# Patient Record
Sex: Female | Born: 1945 | Race: White | Hispanic: No | Smoking: Never smoker
Health system: Southern US, Community
[De-identification: ages and names within clinical notes are randomized; demographics above are authoritative.]

## PROBLEM LIST (undated history)

## (undated) DIAGNOSIS — E079 Disorder of thyroid, unspecified: Secondary | ICD-10-CM

## (undated) DIAGNOSIS — M810 Age-related osteoporosis without current pathological fracture: Secondary | ICD-10-CM

## (undated) DIAGNOSIS — G4730 Sleep apnea, unspecified: Secondary | ICD-10-CM

## (undated) DIAGNOSIS — H269 Unspecified cataract: Secondary | ICD-10-CM

## (undated) DIAGNOSIS — Z86718 Personal history of other venous thrombosis and embolism: Secondary | ICD-10-CM

## (undated) HISTORY — DX: Unspecified cataract: H26.9

## (undated) HISTORY — DX: Sleep apnea, unspecified: G47.30

## (undated) HISTORY — DX: Personal history of other venous thrombosis and embolism: Z86.718

## (undated) HISTORY — PX: OTHER SURGICAL HISTORY: SHX169

## (undated) HISTORY — PX: FEMUR FRACTURE SURGERY: SHX633

## (undated) HISTORY — DX: Disorder of thyroid, unspecified: E07.9

## (undated) HISTORY — DX: Age-related osteoporosis without current pathological fracture: M81.0

## (undated) HISTORY — PX: BUNIONECTOMY: SHX129

## (undated) HISTORY — PX: CHOLECYSTECTOMY: SHX55

## (undated) HISTORY — PX: REPLACEMENT TOTAL KNEE BILATERAL: SUR1225

---

## 2018-02-09 LAB — COMPREHENSIVE METABOLIC PANEL
Albumin: 3.5 (ref 3.5–5.0)
Calcium: 9.5 (ref 8.7–10.7)
GFR calc Af Amer: 57.16
GFR calc non Af Amer: 47.16
Globulin: 2.9

## 2018-02-09 LAB — BASIC METABOLIC PANEL
BUN: 16 (ref 4–21)
CO2: 29 — AB (ref 13–22)
Chloride: 106 (ref 99–108)
Creatinine: 1.1 (ref <=1.1)
Glucose: 87
Potassium: 4 (ref 3.4–5.3)
Sodium: 145 (ref 137–147)

## 2018-02-09 LAB — HEPATIC FUNCTION PANEL
ALT: 18 (ref 7–35)
AST: 18 (ref 13–35)
Bilirubin, Total: 1

## 2018-02-09 LAB — CBC AND DIFFERENTIAL
HCT: 42 (ref 36–46)
Hemoglobin: 13.4 (ref 12.0–16.0)
Platelets: 214 (ref 150–399)
WBC: 7.4

## 2018-02-09 LAB — CBC: RBC: 4.39 (ref 3.87–5.11)

## 2018-02-09 LAB — TSH: TSH: 1.74 (ref 0.41–5.90)

## 2018-09-07 LAB — BASIC METABOLIC PANEL
BUN: 18 (ref 4–21)
CO2: 30 — AB (ref 13–22)
Chloride: 106 (ref 99–108)
Creatinine: 1.2 — AB (ref 0.5–1.1)
Glucose: 94
Potassium: 3.9 (ref 3.4–5.3)
Sodium: 144 (ref 137–147)

## 2018-09-07 LAB — COMPREHENSIVE METABOLIC PANEL
Calcium: 10.2 (ref 8.7–10.7)
GFR calc Af Amer: 56.49
GFR calc non Af Amer: 46.61

## 2018-09-30 LAB — BASIC METABOLIC PANEL
BUN: 17 (ref 4–21)
CO2: 28 — AB (ref 13–22)
Chloride: 104 (ref 99–108)
Creatinine: 1.1 (ref <=1.1)
Potassium: 3.8 (ref 3.4–5.3)
Sodium: 142 (ref 137–147)

## 2018-09-30 LAB — CBC AND DIFFERENTIAL
HCT: 41 (ref 36–46)
Hemoglobin: 13.1 (ref 12.0–16.0)
Platelets: 250 (ref 150–399)
WBC: 9.6

## 2018-09-30 LAB — HEPATIC FUNCTION PANEL
ALT: 9 (ref 7–35)
AST: 12 — AB (ref 13–35)
Bilirubin, Total: 0.9

## 2018-09-30 LAB — TSH: TSH: 14.08 — AB (ref 0.41–5.90)

## 2018-09-30 LAB — COMPREHENSIVE METABOLIC PANEL
Albumin: 3.9 (ref 3.5–5.0)
Calcium: 9.3 (ref 8.7–10.7)
GFR calc Af Amer: 58.23
GFR calc non Af Amer: 48.05
Globulin: 2.4

## 2018-09-30 LAB — CBC: RBC: 4.29 (ref 3.87–5.11)

## 2018-10-26 LAB — HM DEXA SCAN: HM Dexa Scan: -3

## 2018-10-27 LAB — HM MAMMOGRAPHY

## 2018-11-25 DIAGNOSIS — M25561 Pain in right knee: Secondary | ICD-10-CM | POA: Insufficient documentation

## 2018-11-25 LAB — TSH: TSH: 3.7 (ref 0.41–5.90)

## 2019-03-01 LAB — BASIC METABOLIC PANEL
BUN: 24 — AB (ref 4–21)
CO2: 31 — AB (ref 13–22)
Chloride: 105 (ref 99–108)
Creatinine: 1.1 (ref 0.5–1.1)
Glucose: 94
Potassium: 4.1 (ref 3.4–5.3)
Sodium: 144 (ref 137–147)

## 2019-03-01 LAB — COMPREHENSIVE METABOLIC PANEL
Calcium: 9.4 (ref 8.7–10.7)
GFR calc Af Amer: 59.38
GFR calc non Af Amer: 49

## 2019-06-11 LAB — BASIC METABOLIC PANEL
BUN: 20 (ref 4–21)
CO2: 26 — AB (ref 13–22)
Chloride: 107 (ref 99–108)
Creatinine: 1.1 (ref 0.5–1.1)
Glucose: 92
Potassium: 3.4 (ref 3.4–5.3)
Sodium: 143 (ref 137–147)

## 2019-06-11 LAB — CBC AND DIFFERENTIAL
HCT: 41 (ref 36–46)
Hemoglobin: 13.6 (ref 12.0–16.0)
Neutrophils Absolute: 76.9
Platelets: 223 (ref 150–399)
WBC: 8

## 2019-06-11 LAB — CBC: RBC: 4.6 (ref 3.87–5.11)

## 2019-06-11 LAB — COMPREHENSIVE METABOLIC PANEL: Calcium: 9.3 (ref 8.7–10.7)

## 2019-06-29 LAB — LIPID PANEL
Cholesterol: 141 (ref 0–200)
HDL: 45 (ref 35–70)
LDL Cholesterol: 76
LDl/HDL Ratio: 3
Triglycerides: 102 (ref 40–160)

## 2019-06-29 LAB — TSH: TSH: 2.23 (ref <=5.90)

## 2019-10-26 LAB — BASIC METABOLIC PANEL
BUN: 21 (ref 4–21)
CO2: 26 — AB (ref 13–22)
Chloride: 110 — AB (ref 99–108)
Creatinine: 1 (ref <=1.1)
Glucose: 97
Potassium: 3.9 (ref 3.4–5.3)
Sodium: 15 — AB (ref 137–147)

## 2019-10-26 LAB — COMPREHENSIVE METABOLIC PANEL
Calcium: 9.6 (ref 8.7–10.7)
GFR calc Af Amer: 63.24
GFR calc non Af Amer: 52.18

## 2019-11-08 LAB — NOVEL CORONAVIRUS, NAA: SARS-CoV-2, NAA: NEGATIVE

## 2019-12-01 LAB — NOVEL CORONAVIRUS, NAA: SARS-CoV-2, NAA: NEGATIVE

## 2019-12-04 LAB — NOVEL CORONAVIRUS, NAA: SARS-CoV-2, NAA: NOT DETECTED

## 2019-12-28 LAB — HM MAMMOGRAPHY

## 2019-12-30 LAB — CBC AND DIFFERENTIAL
HCT: 42 (ref 36–46)
Hemoglobin: 13.5 (ref 12.0–16.0)
Platelets: 255 (ref 150–399)
WBC: 8.4

## 2019-12-30 LAB — HEPATIC FUNCTION PANEL
ALT: 17 (ref 7–35)
AST: 17 (ref 13–35)
Bilirubin, Total: 0.8

## 2019-12-30 LAB — COMPREHENSIVE METABOLIC PANEL
Albumin: 3.8 (ref 3.5–5.0)
Calcium: 9.1 (ref 8.7–10.7)
GFR calc Af Amer: 55.17
GFR calc non Af Amer: 45.52
Globulin: 2.5

## 2019-12-30 LAB — BASIC METABOLIC PANEL
BUN: 17 (ref 4–21)
CO2: 28 — AB (ref 13–22)
Chloride: 108 (ref 99–108)
Creatinine: 1.2 — AB (ref <=1.1)
Glucose: 101
Potassium: 4.1 (ref 3.4–5.3)
Sodium: 143 (ref 137–147)

## 2019-12-30 LAB — TSH: TSH: 7.18 — AB (ref <=5.90)

## 2019-12-30 LAB — LIPID PANEL
Cholesterol: 181 (ref 0–200)
HDL: 52 (ref 35–70)
LDL Cholesterol: 108
Triglycerides: 103 (ref 40–160)

## 2019-12-30 LAB — VITAMIN D 25 HYDROXY (VIT D DEFICIENCY, FRACTURES): Vit D, 25-Hydroxy: 10.6

## 2019-12-30 LAB — CBC: RBC: 4.54 (ref 3.87–5.11)

## 2020-04-05 LAB — BASIC METABOLIC PANEL
BUN: 22 — AB (ref 4–21)
CO2: 32 — AB (ref 13–22)
Chloride: 106 (ref 99–108)
Creatinine: 1.2 — AB (ref <=1.1)
Glucose: 101
Potassium: 3.4 (ref 3.4–5.3)
Sodium: 143 (ref 137–147)

## 2020-04-05 LAB — CBC AND DIFFERENTIAL
HCT: 42 (ref 36–46)
Hemoglobin: 13.3 (ref 12.0–16.0)
Platelets: 247 (ref 150–399)
WBC: 8.1

## 2020-04-05 LAB — COMPREHENSIVE METABOLIC PANEL
Albumin: 3.8 (ref 3.5–5.0)
Calcium: 9.5 (ref 8.7–10.7)
GFR calc Af Amer: 52.04
GFR calc non Af Amer: 42.94
Globulin: 2.7

## 2020-04-05 LAB — HEPATIC FUNCTION PANEL
ALT: 11 (ref 7–35)
AST: 14 (ref 13–35)

## 2020-04-05 LAB — CBC: RBC: 4.48 (ref 3.87–5.11)

## 2020-04-05 LAB — TSH: TSH: 0.69 (ref <=5.90)

## 2020-06-08 LAB — COMPREHENSIVE METABOLIC PANEL
Calcium: 9.3 (ref 8.7–10.7)
GFR calc Af Amer: 56.78
GFR calc non Af Amer: 46.85

## 2020-06-08 LAB — BASIC METABOLIC PANEL
BUN: 20 (ref 4–21)
CO2: 30 — AB (ref 13–22)
Chloride: 110 — AB (ref 99–108)
Creatinine: 1.1 (ref <=1.1)
Glucose: 102
Potassium: 3.5 (ref 3.4–5.3)
Sodium: 147 (ref 137–147)

## 2020-07-17 ENCOUNTER — Ambulatory Visit (INDEPENDENT_AMBULATORY_CARE_PROVIDER_SITE_OTHER): Payer: Medicare PPO | Admitting: Family Medicine

## 2020-07-17 ENCOUNTER — Other Ambulatory Visit: Payer: Self-pay

## 2020-07-17 ENCOUNTER — Encounter: Payer: Self-pay | Admitting: Family Medicine

## 2020-07-17 VITALS — BP 130/80 | HR 75 | Temp 98.0°F | Resp 18 | Ht 60.0 in | Wt 206.4 lb

## 2020-07-17 DIAGNOSIS — M1990 Unspecified osteoarthritis, unspecified site: Secondary | ICD-10-CM | POA: Insufficient documentation

## 2020-07-17 DIAGNOSIS — M545 Low back pain, unspecified: Secondary | ICD-10-CM

## 2020-07-17 DIAGNOSIS — E785 Hyperlipidemia, unspecified: Secondary | ICD-10-CM | POA: Diagnosis not present

## 2020-07-17 DIAGNOSIS — Z9989 Dependence on other enabling machines and devices: Secondary | ICD-10-CM

## 2020-07-17 DIAGNOSIS — I10 Essential (primary) hypertension: Secondary | ICD-10-CM

## 2020-07-17 DIAGNOSIS — F325 Major depressive disorder, single episode, in full remission: Secondary | ICD-10-CM

## 2020-07-17 DIAGNOSIS — G8929 Other chronic pain: Secondary | ICD-10-CM

## 2020-07-17 DIAGNOSIS — M810 Age-related osteoporosis without current pathological fracture: Secondary | ICD-10-CM

## 2020-07-17 DIAGNOSIS — E039 Hypothyroidism, unspecified: Secondary | ICD-10-CM

## 2020-07-17 DIAGNOSIS — G4733 Obstructive sleep apnea (adult) (pediatric): Secondary | ICD-10-CM

## 2020-07-17 NOTE — Progress Notes (Signed)
Amanda Mathews is a 74 y.o. female who presents today for an office visit.  She is a new patient.   Assessment/Plan:  Chronic Problems Addressed Today: Chronic low back pain No red flags.  She has seen pain management but needs a new physician to help manage her low back pain here in the area.  Her sister sees Dr. Danielle Dess and she would like to see him as well.  She will continue gabapentin, etodolac, Robaxin.  She has spinal stimulator in place.  Essential hypertension At goal.  Continue HCTZ 25 mg daily.  Hypothyroidism Had TSH checked 2 weeks ago which was reportedly normal.  We will continue Synthroid 150 mcg daily.  Dyslipidemia Stable.  Continue simvastatin 20 mg daily.  Had lipid panel done couple of weeks ago.  Osteoporosis Continue calcium and vitamin D supplementation.  She will continue Prolia injections every 6 months.  Last had injection about 2 weeks ago.  Depression, major, single episode, complete remission (HCC) Stable.  Continue Wellbutrin 300 mg daily.  Osteoarthritis Continue management per orthopedics.  OSA on CPAP Continue CPAP.  She will follow up in 6 months.     Subjective:  HPI:  Her stable, chronic medical conditions are outlined below:  # Essential Hypertension -On HCTZ 25 mg daily tolerating well.  # Hypothyroidism -On Synthroid 150 mcg daily and tolerating well  # Dyslipidemia - On simvastatin 20 mg daily and tolerating well  # Osteoporosis - Has prolia injections q31months - On calcium and vitamin D supplementation  # Depression - On wellbutrin 300mg  daily and tolerating well.   # Osteoarthritis s/p bilateral TKA  # Chronic Low Back Pain - Sees Pain Management specialist - Has had ESI in the past which help modestly.  - Has nerve stimulator in place - On gabapentin 600mg  daily - On Etodolac 400mg  twice daily - On Robaxin 750mg  twice daily as needed - On potassium and magnesium to help with cramping  # OSA on CPAP # Reactive  Airway - On albuterol as needed   PMH:  The following were reviewed and entered/updated in epic: Past Medical History:  Diagnosis Date  . Cataract   . Osteoporosis   . Sleep apnea   . Thyroid disease    Patient Active Problem List   Diagnosis Date Noted  . Chronic low back pain 07/17/2020  . Essential hypertension 07/17/2020  . Hypothyroidism 07/17/2020  . Dyslipidemia 07/17/2020  . Osteoporosis 07/17/2020  . 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 Osteoarthritis 07/17/2020  . OSA on CPAP 07/17/2020   Past Surgical History:  Procedure Laterality Date  . BUNIONECTOMY    . CHOLECYSTECTOMY    . FEMUR FRACTURE SURGERY    . REPLACEMENT TOTAL KNEE BILATERAL      Family History  Problem Relation Age of Onset  . Hypertension Mother   . Stroke Mother   . Arthritis Sister   . COPD Sister   . Depression Sister   . Diabetes Sister   . Alcohol abuse Brother   . Hypertension Sister     Medications- reviewed and updated Current Outpatient Medications  Medication Sig Dispense Refill  . albuterol (VENTOLIN HFA) 108 (90 Base) MCG/ACT inhaler Inhale 2 puffs into the lungs every 6 (six) hours as needed for wheezing or shortness of breath.    09/16/2020 albuterol (VENTOLIN HFA) 108 (90 Base) MCG/ACT inhaler Inhale into the lungs every 6 (six) hours as needed.    09/16/2020 ammonium lactate (AMLACTIN) 12 %  cream Apply topically in the morning and at bedtime.    Marland Kitchen buPROPion (WELLBUTRIN XL) 300 MG 24 hr tablet Take 300 mg by mouth daily.    . Calcium Carb-Cholecalciferol (CALCIUM 600-D PO) Take 600 mg by mouth daily.    . Cholecalciferol (VITAMIN D3) 1.25 MG (50000 UT) CAPS Take 50,000 Units by mouth once a week.    . denosumab (PROLIA) 60 MG/ML SOSY injection Inject 60 mg into the skin every 6 (six) months.    . etodolac (LODINE) 400 MG tablet Take 400 mg by mouth 2 (two) times daily.    Marland Kitchen gabapentin (NEURONTIN) 300 MG capsule Take 600 mg by mouth daily.    .  hydrochlorothiazide (HYDRODIURIL) 25 MG tablet Take 25 mg by mouth daily.    Marland Kitchen levothyroxine (SYNTHROID) 150 MCG tablet Take 150 mcg by mouth daily before breakfast.    . magnesium gluconate (MAGONATE) 500 MG tablet Take 500 mg by mouth daily.    . methocarbamol (ROBAXIN) 750 MG tablet Take 750 mg by mouth 2 (two) times daily as needed for muscle spasms.    . NON FORMULARY Bi-pap machine    . potassium chloride SA (KLOR-CON) 20 MEQ tablet Take 20 mEq by mouth daily.    . simvastatin (ZOCOR) 20 MG tablet Take 20 mg by mouth at bedtime.     No current facility-administered medications for this visit.    Allergies-reviewed and updated Allergies  Allergen Reactions  . Codeine Nausea And Vomiting    Social History   Socioeconomic History  . Marital status: Married    Spouse name: Not on file  . Number of children: Not on file  . Years of education: Not on file  . Highest education level: Not on file  Occupational History  . Not on file  Tobacco Use  . Smoking status: Never Smoker  . Smokeless tobacco: Never Used  Substance and Sexual Activity  . Alcohol use: Never  . Drug use: Never  . Sexual activity: Not Currently    Birth control/protection: Abstinence  Other Topics Concern  . Not on file  Social History Narrative  . Not on file   Social Determinants of Health   Financial Resource Strain:   . Difficulty of Paying Living Expenses:   Food Insecurity:   . Worried About Programme researcher, broadcasting/film/video in the Last Year:   . Barista in the Last Year:   Transportation Needs:   . Freight forwarder (Medical):   Marland Kitchen Lack of Transportation (Non-Medical):   Physical Activity:   . Days of Exercise per Week:   . Minutes of Exercise per Session:   Stress:   . Feeling of Stress :   Social Connections:   . Frequency of Communication with Friends and Family:   . Frequency of Social Gatherings with Friends and Family:   . Attends Religious Services:   . Active Member of Clubs or  Organizations:   . Attends Banker Meetings:   Marland Kitchen Marital Status:         Objective:  Physical Exam: BP 130/80   Pulse 75   Temp 98 F (36.7 C) (Temporal)   Resp 18   Ht 5' (1.524 m)   Wt 206 lb 6.4 oz (93.6 kg)   SpO2 95%   BMI 40.31 kg/m   Gen: No acute distress, resting comfortably CV: Regular rate and rhythm with no murmurs appreciated Pulm: Normal work of breathing, clear to auscultation bilaterally with no  crackles, wheezes, or rhonchi Neuro: Grossly normal, moves all extremities Psych: Normal affect and thought content  Time Spent: 64 minutes of total time was spent on the date of the encounter performing the following actions: chart review prior to seeing the patient, obtaining history including pertinent PMH and past surgical history, performing a medically necessary exam, counseling on the treatment plan, placing orders, and documenting in our EHR.        Katina Degree. [name], MD 07/17/2020 2:24 PM

## 2020-07-17 NOTE — Assessment & Plan Note (Signed)
Had TSH checked 2 weeks ago which was reportedly normal.  We will continue Synthroid 150 mcg daily.

## 2020-07-17 NOTE — Assessment & Plan Note (Signed)
No red flags.  She has seen pain management but needs a new physician to help manage her low back pain here in the area.  Her sister sees Dr. Danielle Dess and she would like to see him as well.  She will continue gabapentin, etodolac, Robaxin.  She has spinal stimulator in place.

## 2020-07-17 NOTE — Assessment & Plan Note (Signed)
Continue CPAP.  

## 2020-07-17 NOTE — Assessment & Plan Note (Signed)
Continue calcium and vitamin D supplementation.  She will continue Prolia injections every 6 months.  Last had injection about 2 weeks ago.

## 2020-07-17 NOTE — Assessment & Plan Note (Signed)
At goal.  Continue HCTZ 25 mg daily 

## 2020-07-17 NOTE — Assessment & Plan Note (Signed)
Stable.  Continue simvastatin 20 mg daily.  Had lipid panel done couple of weeks ago.

## 2020-07-17 NOTE — Assessment & Plan Note (Signed)
Stable.  Continue Wellbutrin 300 mg daily. 

## 2020-07-17 NOTE — Patient Instructions (Signed)
It was very nice to see you today!  I will place a referral for you to see Dr Danielle Dess.  We will get your medical records.  Come back to see me in 6 months or sooner if needed.   Take care, Dr [name]  Please try these tips to maintain a healthy lifestyle:   Eat at least 3 REAL meals and 1-2 snacks per day.  Aim for no more than 5 hours between eating.  If you eat breakfast, please do so within one hour of getting up.    Each meal should contain half fruits/vegetables, one quarter protein, and one quarter carbs (no bigger than a computer mouse)   Cut down on sweet beverages. This includes juice, soda, and sweet tea.     Drink at least 1 glass of water with each meal and aim for at least 8 glasses per day   Exercise at least 150 minutes every week.

## 2020-07-17 NOTE — Assessment & Plan Note (Signed)
Continue management per orthopedics. 

## 2020-07-26 ENCOUNTER — Other Ambulatory Visit: Payer: Self-pay

## 2020-07-26 ENCOUNTER — Telehealth: Payer: Self-pay | Admitting: Family Medicine

## 2020-07-26 MED ORDER — METHOCARBAMOL 750 MG PO TABS: 750.0000 mg | ORAL_TABLET | Freq: Two times a day (BID) | ORAL | 1 refills | Status: DC | PRN

## 2020-07-26 NOTE — Telephone Encounter (Signed)
Rx sent 

## 2020-07-26 NOTE — Telephone Encounter (Signed)
..   LAST APPOINTMENT DATE: 07/17/2020   NEXT APPOINTMENT DATE:@2 /06/2021  MEDICATION:methocarbamol (ROBAXIN) 750 MG tablet   PHARMACY:[id number] - , [place] - [address] AT [facility]  **Let patient know to contact pharmacy at the end of the day to make sure medication is ready. **  ** Please notify patient to allow 48-72 hours to process**  **Encourage patient to contact the pharmacy for refills or they can request refills through [facility]**  CLINICAL FILLS OUT ALL BELOW:   LAST REFILL:  QTY:  REFILL DATE:    OTHER COMMENTS:    Okay for refill?  Please advise

## 2020-09-13 ENCOUNTER — Encounter: Payer: Self-pay | Admitting: Family Medicine

## 2020-09-14 ENCOUNTER — Encounter: Payer: Self-pay | Admitting: Family Medicine

## 2020-09-25 ENCOUNTER — Other Ambulatory Visit: Payer: Self-pay | Admitting: Family Medicine

## 2020-10-06 ENCOUNTER — Other Ambulatory Visit: Payer: Self-pay

## 2020-10-06 ENCOUNTER — Ambulatory Visit: Payer: Medicare PPO

## 2020-10-06 ENCOUNTER — Ambulatory Visit (INDEPENDENT_AMBULATORY_CARE_PROVIDER_SITE_OTHER): Payer: Medicare PPO

## 2020-10-06 DIAGNOSIS — M48062 Spinal stenosis, lumbar region with neurogenic claudication: Secondary | ICD-10-CM | POA: Diagnosis not present

## 2020-10-06 DIAGNOSIS — G959 Disease of spinal cord, unspecified: Secondary | ICD-10-CM | POA: Diagnosis not present

## 2020-10-06 DIAGNOSIS — Z6839 Body mass index (BMI) 39.0-39.9, adult: Secondary | ICD-10-CM | POA: Diagnosis not present

## 2020-10-06 DIAGNOSIS — Z0000 Encounter for general adult medical examination without abnormal findings: Secondary | ICD-10-CM | POA: Diagnosis not present

## 2020-10-06 DIAGNOSIS — I10 Essential (primary) hypertension: Secondary | ICD-10-CM | POA: Diagnosis not present

## 2020-10-06 NOTE — Progress Notes (Signed)
Virtual Visit via Telephone Note  I connected with  Amanda Mathews on 10/06/20 at  8:00 AM EDT by telephone and verified that I am speaking with the correct person using two identifiers.  Medicare Annual Wellness visit completed telephonically due to Covid-19 pandemic.   Persons participating in this call: This Health Coach and this patient.   Location: Patient: Home Provider: Office   I discussed the limitations, risks, security and privacy concerns of performing an evaluation and management service by telephone and the availability of in person appointments. The patient expressed understanding and agreed to proceed.  Unable to perform video visit due to video visit attempted and failed and/or patient does not have video capability.   Some vital signs may be absent or patient reported.   Amanda Schleinina H Fordyce Lepak, LPN    Subjective:   Amanda Mathews is a 74 y.o. female who presents for an Initial Medicare Annual Wellness Visit.  Review of Systems     Cardiac Risk Factors include: advanced age (>755men, 33>65 women);hypertension;dyslipidemia;obesity (BMI >30kg/m2)     Objective:    There were no vitals filed for this visit. There is no height or weight on file to calculate BMI.  Advanced Directives 10/06/2020  Does Patient Have a Medical Advance Directive? Yes  Type of Estate agentAdvance Directive Healthcare Power of DexterAttorney;Living will  Copy of Healthcare Power of Attorney in Chart? No - copy requested    Current Medications (verified) Outpatient Encounter Medications as of 10/06/2020  Medication Sig  . albuterol (VENTOLIN HFA) 108 (90 Base) MCG/ACT inhaler Inhale 2 puffs into the lungs every 6 (six) hours as needed for wheezing or shortness of breath.  Marland Kitchen. ammonium lactate (AMLACTIN) 12 % cream Apply topically in the morning and at bedtime.  Marland Kitchen. buPROPion (WELLBUTRIN XL) 300 MG 24 hr tablet Take 300 mg by mouth daily.  . Calcium Carb-Cholecalciferol (CALCIUM 600-D PO) Take 600 mg by  mouth daily.  . Cholecalciferol (VITAMIN D3) 1.25 MG (50000 UT) CAPS Take 50,000 Units by mouth once a week.  . denosumab (PROLIA) 60 MG/ML SOSY injection Inject 60 mg into the skin every 6 (six) months.  . etodolac (LODINE) 400 MG tablet Take 400 mg by mouth 2 (two) times daily.  Marland Kitchen. gabapentin (NEURONTIN) 300 MG capsule Take 600 mg by mouth daily.  . hydrochlorothiazide (HYDRODIURIL) 25 MG tablet Take 25 mg by mouth daily.  Marland Kitchen. HYDROcodone-acetaminophen (NORCO) 7.5-325 MG tablet hydrocodone 7.5 mg-acetaminophen 325 mg tablet  . levothyroxine (SYNTHROID) 150 MCG tablet Take 150 mcg by mouth daily before breakfast.  . magnesium gluconate (MAGONATE) 500 MG tablet Take 500 mg by mouth daily.  . methocarbamol (ROBAXIN) 750 MG tablet TAKE 1 TABLET (750 MG TOTAL) BY MOUTH 2 (TWO) TIMES DAILY AS NEEDED FOR MUSCLE SPASMS  . NON FORMULARY Bi-pap machine  . potassium chloride SA (KLOR-CON) 20 MEQ tablet Take 20 mEq by mouth daily.  . simvastatin (ZOCOR) 20 MG tablet Take 20 mg by mouth at bedtime.  . sulfamethoxazole-trimethoprim (BACTRIM DS) 800-160 MG tablet sulfamethoxazole 800 mg-trimethoprim 160 mg tablet  . tamsulosin (FLOMAX) 0.4 MG CAPS capsule   . [DISCONTINUED] albuterol (VENTOLIN HFA) 108 (90 Base) MCG/ACT inhaler Inhale into the lungs every 6 (six) hours as needed.   No facility-administered encounter medications on file as of 10/06/2020.    Allergies (verified) Codeine and Ropinirole   History: Past Medical History:  Diagnosis Date  . Cataract   . Osteoporosis   . Sleep apnea   . Thyroid disease  Past Surgical History:  Procedure Laterality Date  . BUNIONECTOMY    . CHOLECYSTECTOMY    . FEMUR FRACTURE SURGERY    . REPLACEMENT TOTAL KNEE BILATERAL     Family History  Problem Relation Age of Onset  . Hypertension Mother   . Stroke Mother   . Arthritis Sister   . COPD Sister   . Depression Sister   . Diabetes Sister   . Alcohol abuse Brother   . Hypertension Sister     Social History   Socioeconomic History  . Marital status: Married    Spouse name: Not on file  . Number of children: Not on file  . Years of education: Not on file  . Highest education level: Not on file  Occupational History  . Occupation: Retired   Tobacco Use  . Smoking status: Never Smoker  . Smokeless tobacco: Never Used  Substance and Sexual Activity  . Alcohol use: Never  . Drug use: Never  . Sexual activity: Not Currently    Birth control/protection: Abstinence  Other Topics Concern  . Not on file  Social History Narrative  . Not on file   Social Determinants of Health   Financial Resource Strain: Low Risk   . Difficulty of Paying Living Expenses: Not hard at all  Food Insecurity: No Food Insecurity  . Worried About Programme researcher, broadcasting/film/video in the Last Year: Never true  . Ran Out of Food in the Last Year: Never true  Transportation Needs: No Transportation Needs  . Lack of Transportation (Medical): No  . Lack of Transportation (Non-Medical): No  Physical Activity: Sufficiently Active  . Days of Exercise per Week: 5 days  . Minutes of Exercise per Session: 30 min  Stress: No Stress Concern Present  . Feeling of Stress : Not at all  Social Connections: Moderately Isolated  . Frequency of Communication with Friends and Family: More than three times a week  . Frequency of Social Gatherings with Friends and Family: More than three times a week  . Attends Religious Services: More than 4 times per year  . Active Member of Clubs or Organizations: No  . Attends Banker Meetings: Never  . Marital Status: Divorced    Tobacco Counseling Counseling given: Not Answered   Clinical Intake:  Pre-visit preparation completed: Yes  Pain : No/denies pain     BMI - recorded: 40.31 Nutritional Status: BMI > 30  Obese Nutritional Risks: None Diabetes: No  How often do you need to have someone help you when you read instructions, pamphlets, or other written  materials from your doctor or pharmacy?: 1 - Never  Diabetic?No  Interpreter Needed?: No  Information entered by :: Lanier Ensign, LPN   Activities of Daily Living In your present state of health, do you have any difficulty performing the following activities: 10/06/2020 07/17/2020  Hearing? N N  Vision? N N  Difficulty concentrating or making decisions? Y N  Comment memeory at times -  Walking or climbing stairs? Y Y  Comment can be difficulty at times -  Dressing or bathing? N N  Doing errands, shopping? N N  Preparing Food and eating ? N -  Using the Toilet? N -  In the past six months, have you accidently leaked urine? N -  Do you have problems with loss of bowel control? N -  Managing your Medications? N -  Managing your Finances? N -  Housekeeping or managing your Housekeeping? N -  Some  recent data might be hidden    Patient Care Team: Ardith Dark, MD as PCP - General (Family Medicine)  Indicate any recent Medical Services you may have received from other than [facility] providers in the past year (date may be approximate).     Assessment:   This is a routine wellness examination for Azaryah.  Hearing/Vision screen  Hearing Screening   125Hz  250Hz  500Hz  1000Hz  2000Hz  3000Hz  4000Hz  6000Hz  8000Hz   Right ear:           Left ear:           Comments: Pt denies any difficulty hearing   Vision Screening Comments: Pt follows up with Dr annually   Dietary issues and exercise activities discussed: Current Exercise Habits: Home exercise routine, Type of exercise: walking (driveway and grocery stores), Time (Minutes): 30, Frequency (Times/Week): 5, Weekly Exercise (Minutes/Week): 150  Goals    . Patient Stated     Lose weight and increase walking      Depression Screen PHQ 2/9 Scores 10/06/2020 07/17/2020  PHQ - 2 Score 0 0    Fall Risk Fall Risk  10/06/2020  Falls in the past year? 0  Number falls in past yr: 0  Injury with Fall? 0  Risk for fall due to :  Impaired vision;Impaired mobility;Impaired balance/gait  Follow up Falls prevention discussed    Any stairs in or around the home? Yes  If so, are there any without handrails? No  Home free of loose throw rugs in walkways, pet beds, electrical cords, etc? Yes  Adequate lighting in your home to reduce risk of falls? Yes   ASSISTIVE DEVICES UTILIZED TO PREVENT FALLS:  Life alert? No  Use of a cane, walker or w/c? Yes  Grab bars in the bathroom? Yes  Shower chair or bench in shower? Yes  Elevated toilet seat or a handicapped toilet? No   TIMED UP AND GO:  Was the test performed? No .     Cognitive Function:     6CIT Screen 10/06/2020  What Year? 0 points  What month? 0 points  Count back from 20 0 points  Months in reverse 0 points  Repeat phrase 0 points    Immunizations Immunization History  Administered Date(s) Administered  . Influenza, High Dose Seasonal PF 10/26/2019  . Influenza,inj,quad, With Preservative 05/16/2017, 09/15/2018, 08/17/2019  . Influenza-Unspecified 10/15/2012, 09/06/2013, 09/12/2014, 09/26/2015, 10/03/2016, 09/24/2017, 09/25/2018  . Moderna SARS-COVID-2 Vaccination 01/25/2020, 02/22/2020  . PPD Test 09/12/2014  . Pneumococcal-Unspecified 07/07/2013, 11/21/2014  . Tdap 09/12/2014  . Unspecified SARS-COV-2 Vaccination 01/25/2020  . Zoster 12/14/2013, 09/16/2017, 04/01/2018    TDAP status: Up to date Flu Vaccine status: Declined, Education has been provided regarding the importance of this vaccine but patient still declined. Advised may receive this vaccine at local pharmacy or Health Dept. Aware to provide a copy of the vaccination record if obtained from local pharmacy or Health Dept. Verbalized acceptance and understanding. Pneumococcal vaccine status: Up to date Unspecified 07/07/13 & 11/21/14  Covid-19 vaccine status: Completed vaccines  Qualifies for Shingles Vaccine? Yes   Zostavax completed Yes   Shingrix Completed?: No.    Education has  been provided regarding the importance of this vaccine. Patient has been advised to call insurance company to determine out of pocket expense if they have not yet received this vaccine. Advised may also receive vaccine at local pharmacy or Health Dept. Verbalized acceptance and understanding. Pt stated that she had shingrix in 07/09/2013 [place]  Screening Tests Health Maintenance  Topic Date Due  . Hepatitis C Screening  Never done  . PNA vac Low Risk Adult (2 of 2 - PCV13) 11/22/2015  . INFLUENZA VACCINE  03/15/2021 (Originally 07/16/2020)  . MAMMOGRAM  12/27/2021  . TETANUS/TDAP  09/12/2024  . COLONOSCOPY  12/16/2029  . DEXA SCAN  Completed  . COVID-19 Vaccine  Completed    Health Maintenance  Health Maintenance Due  Topic Date Due  . Hepatitis C Screening  Never done  . PNA vac Low Risk Adult (2 of 2 - PCV13) 11/22/2015    Colorectal cancer screening: Completed 12/17/19. Repeat every 10 years Mammogram status: Completed 12/28/19. Repeat every year Bone Density status: Completed 10/26/18. Results reflect: Bone density results: OSTEOPOROSIS. Repeat every 2 years.    Additional Screening:  Hepatitis C Screening: does qualify  Vision Screening: Recommended annual ophthalmology exams for early detection of glaucoma and other disorders of the eye. Is the patient up to date with their annual eye exam?  Yes  Who is the provider or what is the name of the office in which the patient attends annual eye exams? Dr Jovita Kussmaul   Dental Screening: Recommended annual dental exams for proper oral hygiene  Community Resource Referral / Chronic Care Management: CRR required this visit?  No   CCM required this visit?  No      Plan:     I have personally reviewed and noted the following in the patient's chart:   . Medical and social history . Use of alcohol, tobacco or illicit drugs  . Current medications and supplements . Functional ability and status . Nutritional status . Physical  activity . Advanced directives . List of other physicians . Hospitalizations, surgeries, and ER visits in previous 12 months . Vitals . Screenings to include cognitive, depression, and falls . Referrals and appointments  In addition, I have reviewed and discussed with patient certain preventive protocols, quality metrics, and best practice recommendations. A written personalized care plan for preventive services as well as general preventive health recommendations were provided to patient.     Amanda Schlein, LPN   [id number]   Nurse Notes: None

## 2020-10-06 NOTE — Patient Instructions (Addendum)
Amanda Mathews , Thank you for taking time to come for your Medicare Wellness Visit. I appreciate your ongoing commitment to your health goals. Please review the following plan we discussed and let me know if I can assist you in the future.   Screening recommendations/referrals: Colonoscopy: Done 12/17/19 Mammogram: Done 12/28/19 Bone Density: Done 10/26/18 Recommended yearly ophthalmology/optometry visit for glaucoma screening and checkup Recommended yearly dental visit for hygiene and checkup  Vaccinations: Influenza vaccine: Postponed until 03/15/21 Pneumococcal vaccine: Unspecified 07/07/13, 11/21/14 Tdap vaccine: Up to date Shingles vaccine: Zoster 12/14/13, 09/16/17, & 04/01/18   Covid-19:Completed 01/25/20 & 02/22/20  Advanced directives: Please bring a copy of your health care power of attorney and living will to the office at your convenience.  Conditions/risks identified: Lose weight and increase walking  Next appointment: Follow up in one year for your annual wellness visit    Preventive Care 65 Years and Older, Female Preventive care refers to lifestyle choices and visits with your health care provider that can promote health and wellness. What does preventive care include?  A yearly physical exam. This is also called an annual well check.  Dental exams once or twice a year.  Routine eye exams. Ask your health care provider how often you should have your eyes checked.  Personal lifestyle choices, including:  Daily care of your teeth and gums.  Regular physical activity.  Eating a healthy diet.  Avoiding tobacco and drug use.  Limiting alcohol use.  Practicing safe sex.  Taking low-dose aspirin every day.  Taking vitamin and mineral supplements as recommended by your health care provider. What happens during an annual well check? The services and screenings done by your health care provider during your annual well check will depend on your age, overall health,  lifestyle risk factors, and family history of disease. Counseling  Your health care provider may ask you questions about your:  Alcohol use.  Tobacco use.  Drug use.  Emotional well-being.  Home and relationship well-being.  Sexual activity.  Eating habits.  History of falls.  Memory and ability to understand (cognition).  Work and work Astronomer.  Reproductive health. Screening  You may have the following tests or measurements:  Height, weight, and BMI.  Blood pressure.  Lipid and cholesterol levels. These may be checked every 5 years, or more frequently if you are over 40 years old.  Skin check.  Lung cancer screening. You may have this screening every year starting at age 7 if you have a 30-pack-year history of smoking and currently smoke or have quit within the past 15 years.  Fecal occult blood test (FOBT) of the stool. You may have this test every year starting at age 26.  Flexible sigmoidoscopy or colonoscopy. You may have a sigmoidoscopy every 5 years or a colonoscopy every 10 years starting at age 66.  Hepatitis C blood test.  Hepatitis B blood test.  Sexually transmitted disease (STD) testing.  Diabetes screening. This is done by checking your blood sugar (glucose) after you have not eaten for a while (fasting). You may have this done every 1-3 years.  Bone density scan. This is done to screen for osteoporosis. You may have this done starting at age 13.  Mammogram. This may be done every 1-2 years. Talk to your health care provider about how often you should have regular mammograms. Talk with your health care provider about your test results, treatment options, and if necessary, the need for more tests. Vaccines  Your health care  provider may recommend certain vaccines, such as:  Influenza vaccine. This is recommended every year.  Tetanus, diphtheria, and acellular pertussis (Tdap, Td) vaccine. You may need a Td booster every 10 years.  Zoster  vaccine. You may need this after age 67.  Pneumococcal 13-valent conjugate (PCV13) vaccine. One dose is recommended after age 17.  Pneumococcal polysaccharide (PPSV23) vaccine. One dose is recommended after age 67. Talk to your health care provider about which screenings and vaccines you need and how often you need them. This information is not intended to replace advice given to you by your health care provider. Make sure you discuss any questions you have with your health care provider. Document Released: 12/29/2015 Document Revised: 08/21/2016 Document Reviewed: 10/03/2015 Elsevier Interactive Patient Education  2017 [place] Prevention in the Home Falls can cause injuries. They can happen to people of all ages. There are many things you can do to make your home safe and to help prevent falls. What can I do on the outside of my home?  Regularly fix the edges of walkways and driveways and fix any cracks.  Remove anything that might make you trip as you walk through a door, such as a raised step or threshold.  Trim any bushes or trees on the path to your home.  Use bright outdoor lighting.  Clear any walking paths of anything that might make someone trip, such as rocks or tools.  Regularly check to see if handrails are loose or broken. Make sure that both sides of any steps have handrails.  Any raised decks and porches should have guardrails on the edges.  Have any leaves, snow, or ice cleared regularly.  Use sand or salt on walking paths during winter.  Clean up any spills in your garage right away. This includes oil or grease spills. What can I do in the bathroom?  Use night lights.  Install grab bars by the toilet and in the tub and shower. Do not use towel bars as grab bars.  Use non-skid mats or decals in the tub or shower.  If you need to sit down in the shower, use a plastic, non-slip stool.  Keep the floor dry. Clean up any water that spills on the  floor as soon as it happens.  Remove soap buildup in the tub or shower regularly.  Attach bath mats securely with double-sided non-slip rug tape.  Do not have throw rugs and other things on the floor that can make you trip. What can I do in the bedroom?  Use night lights.  Make sure that you have a light by your bed that is easy to reach.  Do not use any sheets or blankets that are too big for your bed. They should not hang down onto the floor.  Have a firm chair that has side arms. You can use this for support while you get dressed.  Do not have throw rugs and other things on the floor that can make you trip. What can I do in the kitchen?  Clean up any spills right away.  Avoid walking on wet floors.  Keep items that you use a lot in easy-to-reach places.  If you need to reach something above you, use a strong step stool that has a grab bar.  Keep electrical cords out of the way.  Do not use floor polish or wax that makes floors slippery. If you must use wax, use non-skid floor wax.  Do not have throw  rugs and other things on the floor that can make you trip. What can I do with my stairs?  Do not leave any items on the stairs.  Make sure that there are handrails on both sides of the stairs and use them. Fix handrails that are broken or loose. Make sure that handrails are as [place] as the stairways.  Check any carpeting to make sure that it is firmly attached to the stairs. Fix any carpet that is loose or worn.  Avoid having throw rugs at the top or bottom of the stairs. If you do have throw rugs, attach them to the floor with carpet tape.  Make sure that you have a light switch at the top of the stairs and the bottom of the stairs. If you do not have them, ask someone to add them for you. What else can I do to help prevent falls?  Wear shoes that:  Do not have high heels.  Have rubber bottoms.  Are comfortable and fit you well.  Are closed at the toe. Do not wear  sandals.  If you use a stepladder:  Make sure that it is fully opened. Do not climb a closed stepladder.  Make sure that both sides of the stepladder are locked into place.  Ask someone to hold it for you, if possible.  Clearly mark and make sure that you can see:  Any grab bars or handrails.  First and last steps.  Where the edge of each step is.  Use tools that help you move around (mobility aids) if they are needed. These include:  Canes.  Walkers.  Scooters.  Crutches.  Turn on the lights when you go into a dark area. Replace any light bulbs as soon as they burn out.  Set up your furniture so you have a clear path. Avoid moving your furniture around.  If any of your floors are uneven, fix them.  If there are any pets around you, be aware of where they are.  Review your medicines with your doctor. Some medicines can make you feel dizzy. This can increase your chance of falling. Ask your doctor what other things that you can do to help prevent falls. This information is not intended to replace advice given to you by your health care provider. Make sure you discuss any questions you have with your health care provider. Document Released: 09/28/2009 Document Revised: 05/09/2016 Document Reviewed: 01/06/2015 Elsevier Interactive Patient Education  2017 [employer].

## 2020-10-09 ENCOUNTER — Other Ambulatory Visit (HOSPITAL_COMMUNITY): Payer: Self-pay | Admitting: Neurological Surgery

## 2020-10-09 ENCOUNTER — Other Ambulatory Visit: Payer: Self-pay | Admitting: Neurological Surgery

## 2020-10-09 DIAGNOSIS — G959 Disease of spinal cord, unspecified: Secondary | ICD-10-CM

## 2020-10-09 DIAGNOSIS — M48062 Spinal stenosis, lumbar region with neurogenic claudication: Secondary | ICD-10-CM

## 2020-10-19 ENCOUNTER — Ambulatory Visit (HOSPITAL_COMMUNITY)
Admission: RE | Admit: 2020-10-19 | Discharge: 2020-10-19 | Disposition: A | Discharge status: Home / Self Care | Payer: Medicare PPO | Source: Ambulatory Visit | Attending: Neurological Surgery | Admitting: Neurological Surgery

## 2020-10-19 ENCOUNTER — Other Ambulatory Visit: Payer: Self-pay

## 2020-10-19 ENCOUNTER — Ambulatory Visit (HOSPITAL_COMMUNITY): Payer: Medicare PPO

## 2020-10-19 ENCOUNTER — Encounter (HOSPITAL_COMMUNITY): Payer: Self-pay

## 2020-10-19 DIAGNOSIS — M4716 Other spondylosis with myelopathy, lumbar region: Secondary | ICD-10-CM | POA: Diagnosis not present

## 2020-10-19 DIAGNOSIS — M48062 Spinal stenosis, lumbar region with neurogenic claudication: Secondary | ICD-10-CM | POA: Diagnosis not present

## 2020-10-19 DIAGNOSIS — M4714 Other spondylosis with myelopathy, thoracic region: Secondary | ICD-10-CM | POA: Diagnosis not present

## 2020-10-19 DIAGNOSIS — M4802 Spinal stenosis, cervical region: Secondary | ICD-10-CM | POA: Diagnosis not present

## 2020-10-19 DIAGNOSIS — M48061 Spinal stenosis, lumbar region without neurogenic claudication: Secondary | ICD-10-CM | POA: Diagnosis not present

## 2020-10-19 DIAGNOSIS — M47814 Spondylosis without myelopathy or radiculopathy, thoracic region: Secondary | ICD-10-CM | POA: Diagnosis not present

## 2020-10-19 DIAGNOSIS — M4712 Other spondylosis with myelopathy, cervical region: Secondary | ICD-10-CM | POA: Diagnosis not present

## 2020-10-19 DIAGNOSIS — G959 Disease of spinal cord, unspecified: Secondary | ICD-10-CM | POA: Diagnosis not present

## 2020-10-19 IMAGING — CT CT L SPINE W/ CM
3 series · 9 of 33 positions shown, 10 images · IV contrast (Omni 300)
Comparison: none

CLINICAL DATA: Severe mid back pain
TECHNIQUE: Contiguous axial images were obtained through the Cervical,
Thoracic, and Lumbar spine after the intrathecal infusion of
infusion. Coronal and sagittal reconstructions were obtained of the
axial image sets.

[Series 6: l-spine 2.0 st · axial · 0.28mm/px · z∈[+834,+834]mm · 1 of 103 slices shown, 2 images]
[im 55/103  soft-tissue]
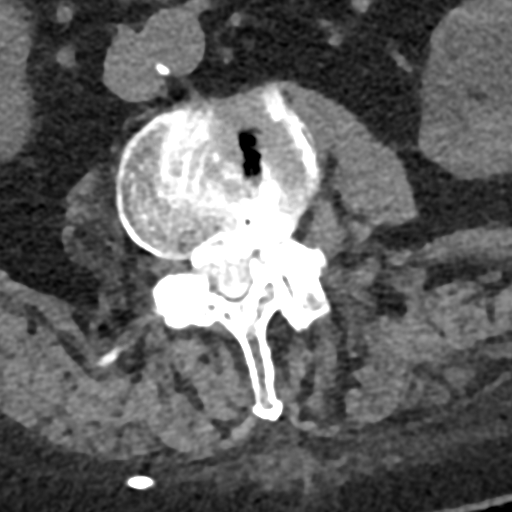
[im 55/103  bone]
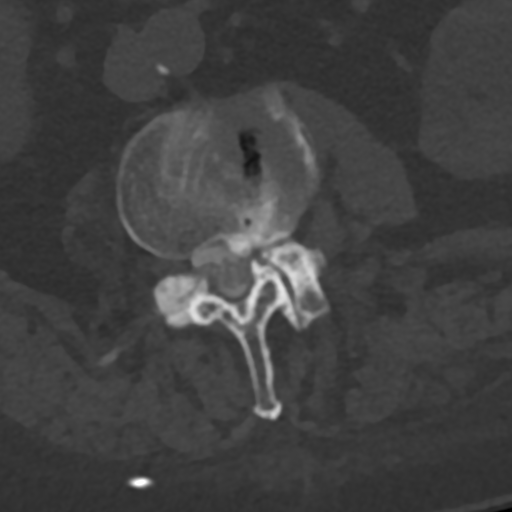

[Series 10: l-spine 2.0 cor bone · coronal · 0.26mm/px · 3 of 53 slices shown]
[im 11/53  bone]
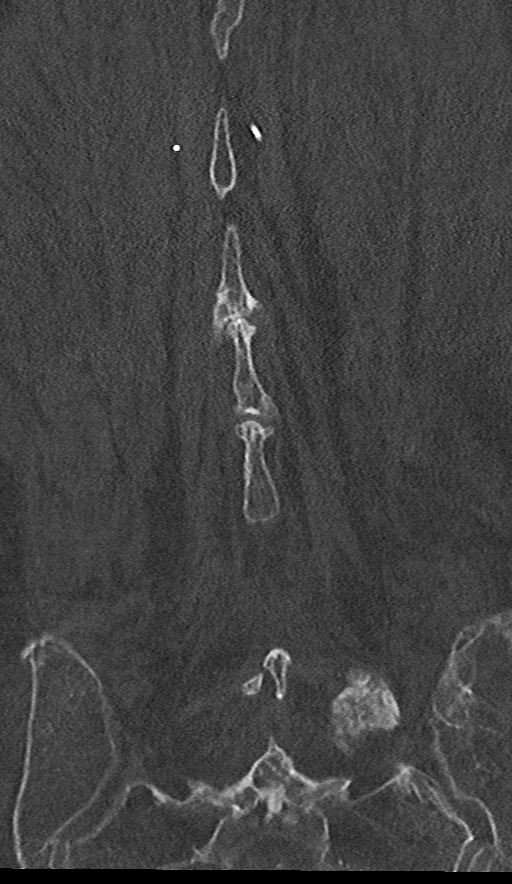
[im 21/53  bone]
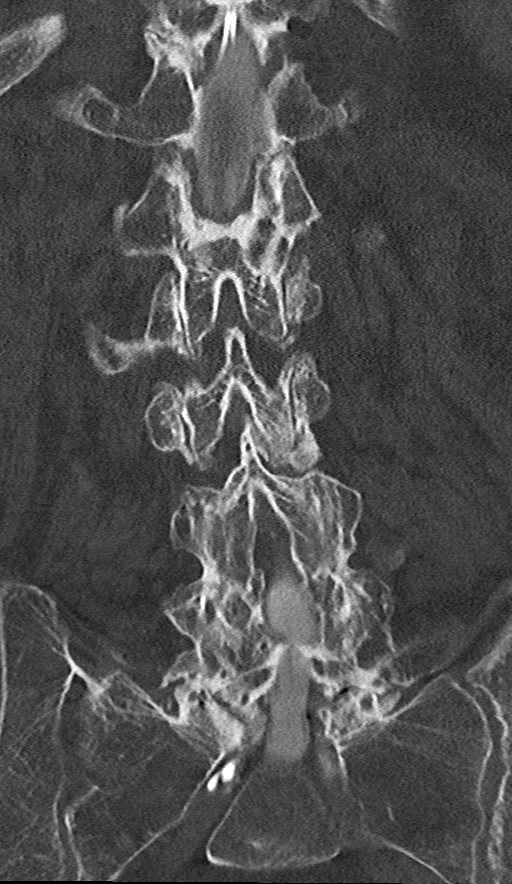
[im 32/53  bone]
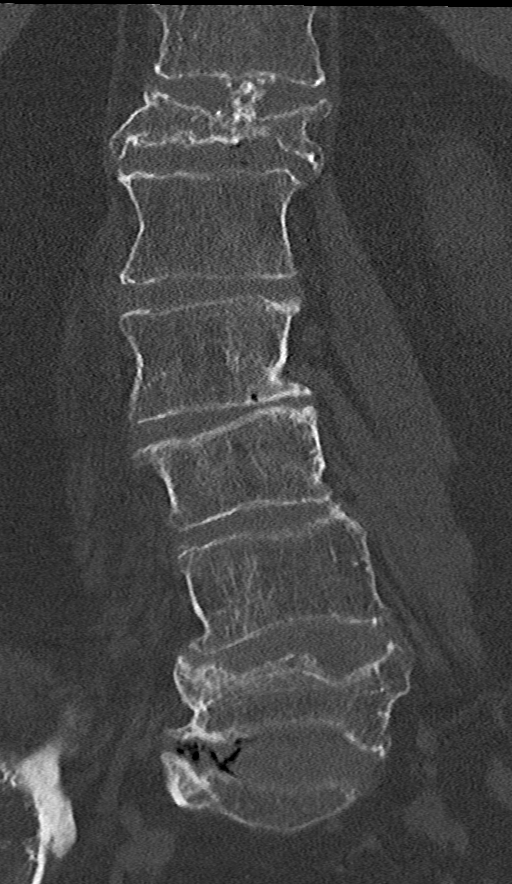

[Series 11: l-spine 2.0 sag bone · sagittal · 0.26mm/px · 5 of 60 slices shown]
[im 20/60  bone]
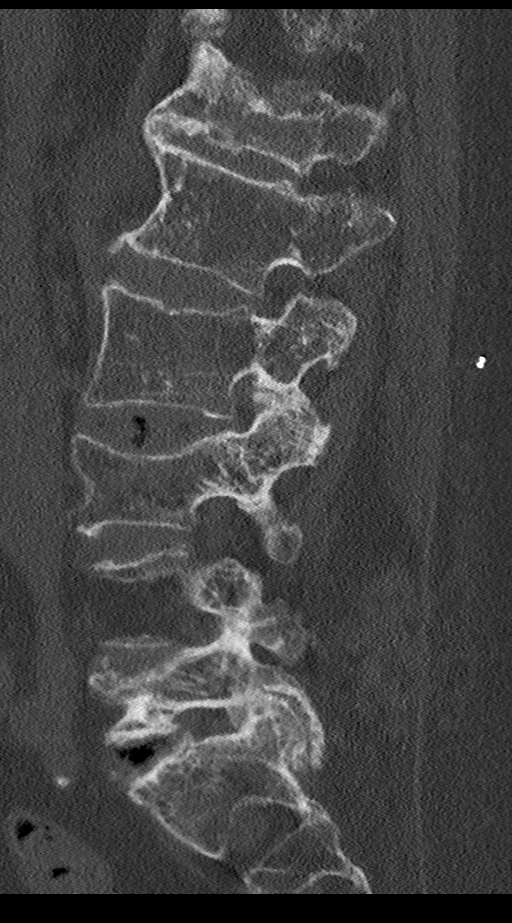
[im 25/60  bone]
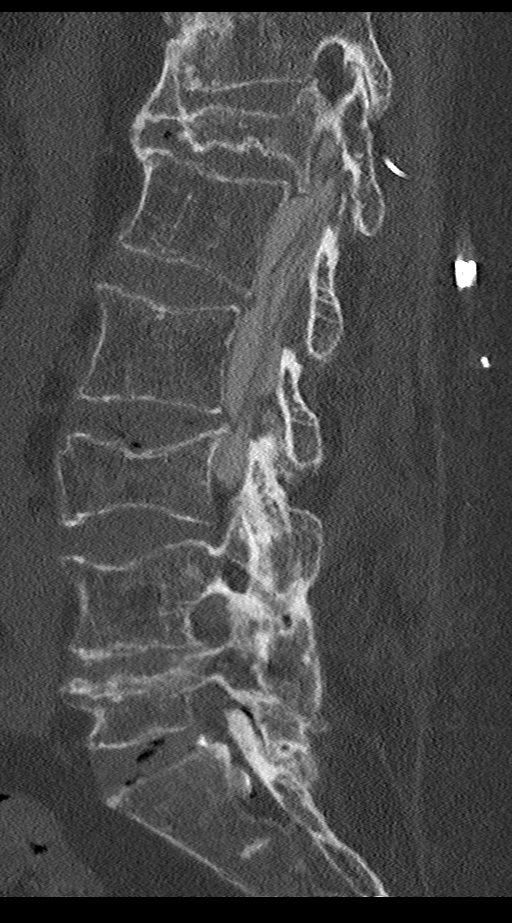
[im 30/60  bone]
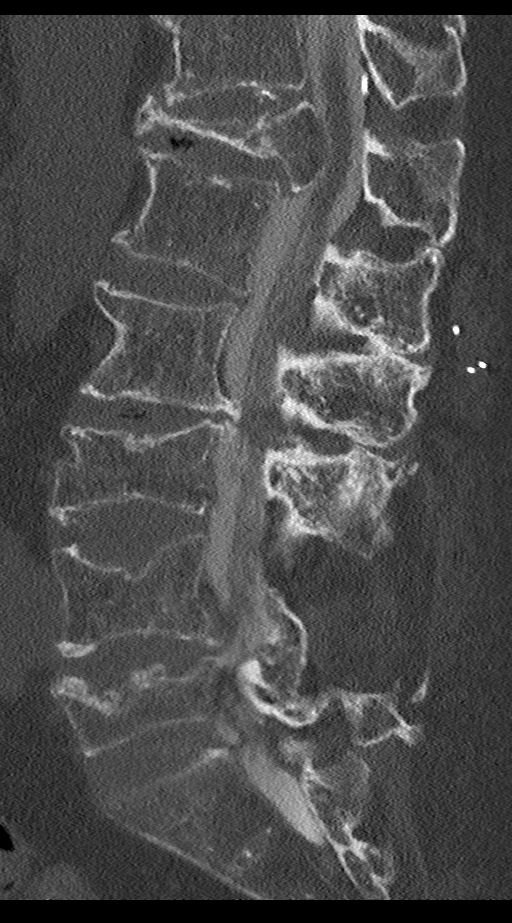
[im 35/60  bone]
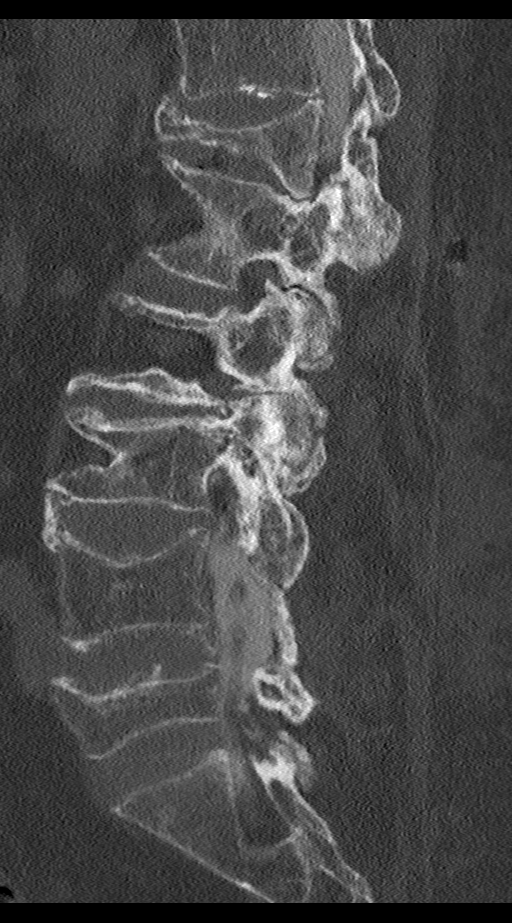
[im 40/60  bone]
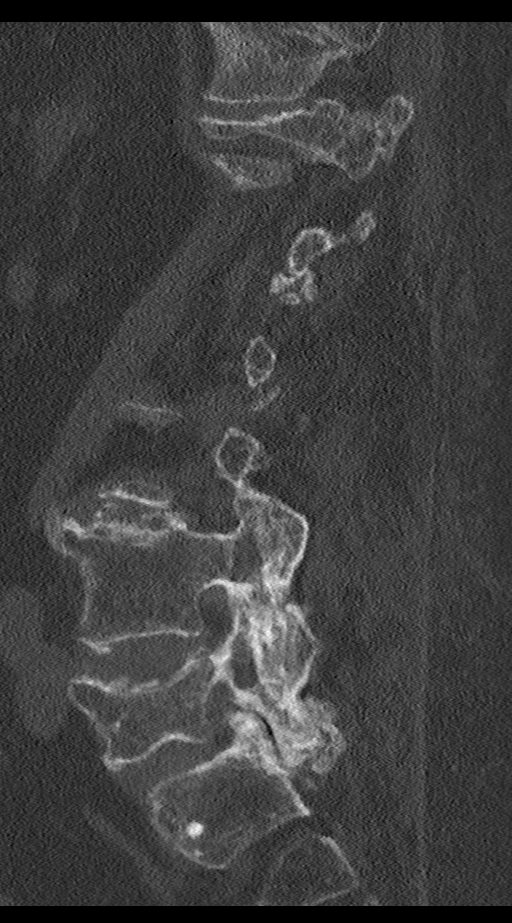

[9 of 33 positions shown; findings below may reference images not displayed]

FLUOROSCOPY TIME:  Reference procedure note

PROCEDURE:
LUMBAR PUNCTURE FOR CERVICAL LUMBAR AND THORACIC MYELOGRAM

CERVICAL AND LUMBAR AND THORACIC MYELOGRAM

CT CERVICAL MYELOGRAM

CT LUMBAR MYELOGRAM

CT THORACIC MYELOGRAM

Lumbar puncture and intrathecal contrast administration were
performed by Dr TANEISHA who will separately report for the portion of
the procedure. I personally supervised acquisition of the myelogram
images.
FINDINGS: CERVICAL AND LUMBAR MYELOGRAM FINDINGS:

Very limited by patient's immobility and gait difficulty. Contrast
would not advance beyond the lower thoracic spine in the prone
position and contrast had to be advanced to the foramen magnum in
the right lateral decubitus position. Due to limited yield, limited
diagnostic images were obtained.

CT CERVICAL MYELOGRAM FINDINGS:

Alignment: Exaggerated lordosis.  Mild C3-4 retrolisthesis

Vertebrae: No evidence of fracture or bone lesion. Negative for
erosion.

Cord: Normal morphology and bulk.

Extra-spinal: No evidence of inflammation or mass.

Disc levels:

C2-3: Mild facet spurring. Disc narrowing and mild ridging. No
neural impingement

C3-4: Disc narrowing with endplate and uncovertebral ridging
eccentric to the right where there is prominent uncovertebral
spurring and advanced right foraminal impingement. Left foraminal
narrowing is moderate. Mild spinal stenosis with effacement of
ventral subarachnoid space.

C4-5: Disc narrowing with endplate and uncovertebral ridging.
Advanced right and moderate left foraminal stenosis. Endplate
ridging effaces the ventral CSF and could mildly deforms the ventral
cord.

C5-6: Disc narrowing with gas containing cleft. Endplate and
uncovertebral ridging. Mild bilateral foraminal stenosis. Patent
spinal canal

C6-7: Disc narrowing and ridging. Negative facets. No neural
impingement

C7-T1:Minor facet spurring.  No impingement

CT LUMBAR MYELOGRAM FINDINGS:

Segmentation: 5 lumbar type vertebrae.

Alignment: Dextroscoliosis.

Vertebrae: Remote L3, L4, and L5 compression fractures with over 50%
height loss at L5. Solid posterior-lateral arthrodesis at L3-4 and
L4-5.

Conus: Tip terminates at L1-2.  Unremarkable cauda equina.

Extra-spinal: Bilateral renal sinus cysts. Asymmetric fatty atrophy
of the right psoas. Atrophy of intrinsic back muscles.

Disc levels:

T12- L1: Disc narrowing and posttraumatic deformity with foraminal
stenosis that is moderate to advanced on the left. Mild spinal
stenosis.

L1-L2: Degenerative facet spurring on the left more than right. Mild
disc narrowing and bulging. Moderate left foraminal stenosis

L2-L3: Disc narrowing asymmetric to the left where there is bulky
far-lateral spurring. Gas containing disc fissure. Chronic left
paracentral protrusions/ridging. Asymmetric left facet
osteoarthritis. Moderate spinal stenosis with asymmetric left L3
impingement in the subarticular recess. High-grade left foraminal
impingement.

L3-L4: Posterior-lateral arthrodesis which is solid.  No impingement

L4-L5: Posterior-lateral arthrodesis which is solid.  No impingement

L5-S1:Bulky degenerative facet spurring. Mild disc narrowing with
gas containing fissure. Mild to moderate bilateral foraminal
stenosis.

CT THORACIC MYELOGRAM FINDINGS:

Alignment: Negative for listhesis.  Generalized straightening.

Vertebrae: Generalized osteopenia. No evidence of recent fracture or
bone lesion. Remote compression fracture of T12 with advanced height
loss and moderate retropulsion. Possible remote osteophyte fracture
at T11-12.

Cord: Normal shape and morphology. There is a dorsal column
stimulator seen at T8 to T10-11, without cord impingement.

Extra-spinal: No acute or aggressive finding.

Disc levels:

Bridging osteophytes from T5-T10. Additional non bridging osteophyte
at T10-11 followed by bridging osteophytes at T11-12 and T12-L1. No
notable facet spurring.

Chronic calcified disc protrusion at T7-8 which contacts the ventral
cord. No cord compression.

Spurring and disc narrowing causes moderate bilateral foraminal
stenosis at T10-11.
IMPRESSION: Cervical spine:

1. Generalized disc degeneration with mild spinal stenosis at C3-4
to C5-6.
2. Advanced right and moderate left foraminal stenosis at C3-4 and
C4-5.

Thoracic spine:

1. Spondylosis with bridging osteophytes from T5 to T10.
2. Remote T12 compression fracture with advanced height loss and
mild retropulsion.
3. Chronic calcified disc protrusion at T7-8. No spinal stenosis
throughout the thoracic spine. Moderate bilateral foraminal
narrowing at T10-11.
4. Dorsal column stimulator at T11-T10 11.

Lumbar spine:

1. Multilevel degenerative disease with scoliosis. Remote L3, L4,
and L5 compression fractures.
2. T12-L1 advanced left foraminal impingement.
3. L2-3 moderate spinal stenosis with asymmetric left subarticular
recess effacement and L3 impingement. Moderate left foraminal
impingement at this level.
4. L3-4 and L4-5 solid posterior-lateral arthrodesis with widely
patent canal after laminectomy.
5. L5-S1 mild to moderate bilateral foraminal narrowing mainly from
facet spurring.

## 2020-10-19 IMAGING — CT CT T SPINE W/ CM
2 of 4 series · 8 of 33 positions shown, 10 images · non-contrast
Comparison: none

CLINICAL DATA: Severe mid back pain
TECHNIQUE: Contiguous axial images were obtained through the Cervical,
Thoracic, and Lumbar spine after the intrathecal infusion of
infusion. Coronal and sagittal reconstructions were obtained of the
axial image sets.

[Series 6: t-spine 2.0 st · axial · 0.27mm/px · z∈[+944,+1146]mm · 5 of 150 slices shown, 7 images]
[im 25/150  soft-tissue]
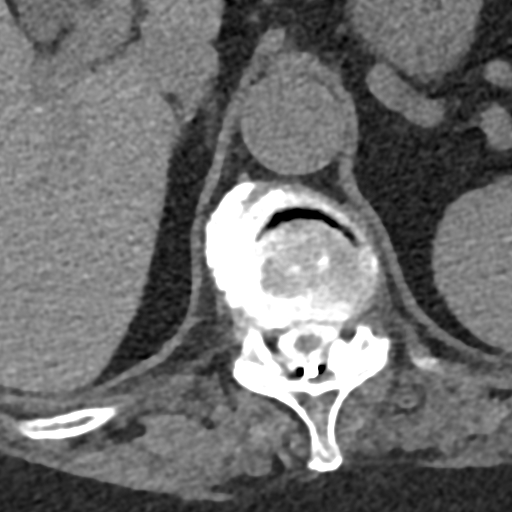
[im 25/150  bone]
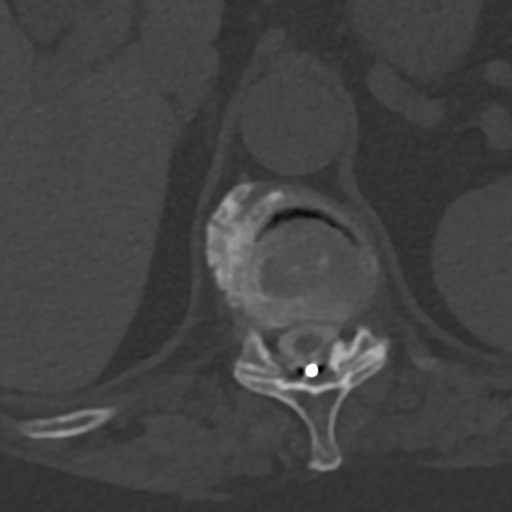
[im 50/150  bone]
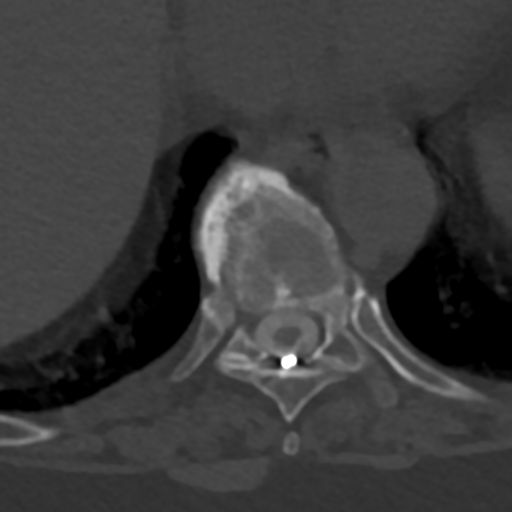
[im 75/150  bone]
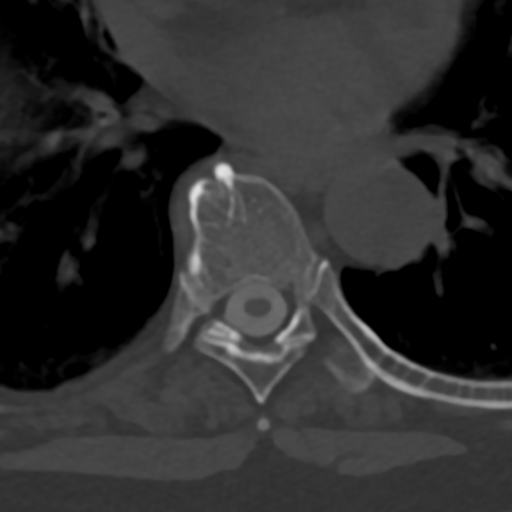
[im 100/150  bone]
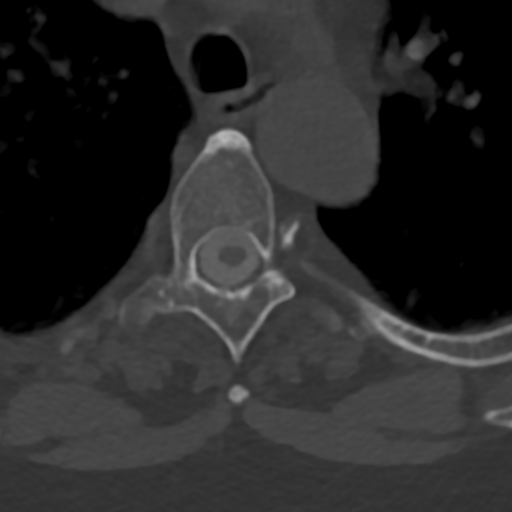
[im 125/150  soft-tissue]
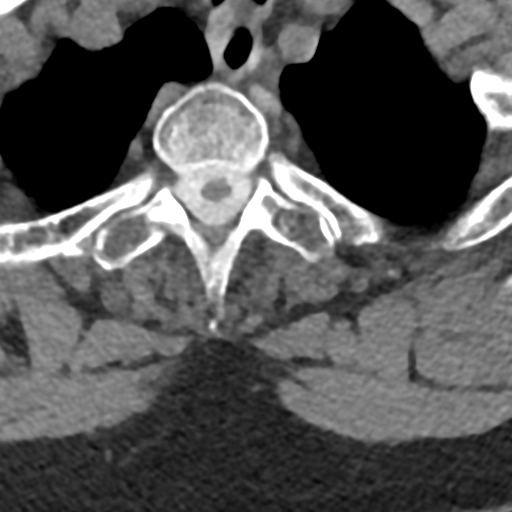
[im 125/150  bone]
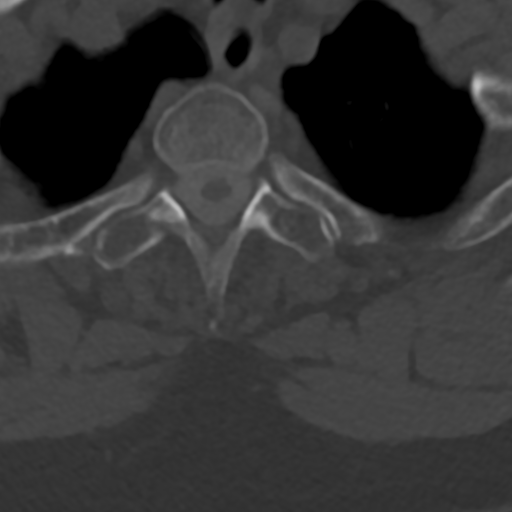

[Series 10: t-spine 2.0 cor bone · coronal · 0.23mm/px · 3 of 60 slices shown]
[im 12/60  bone]
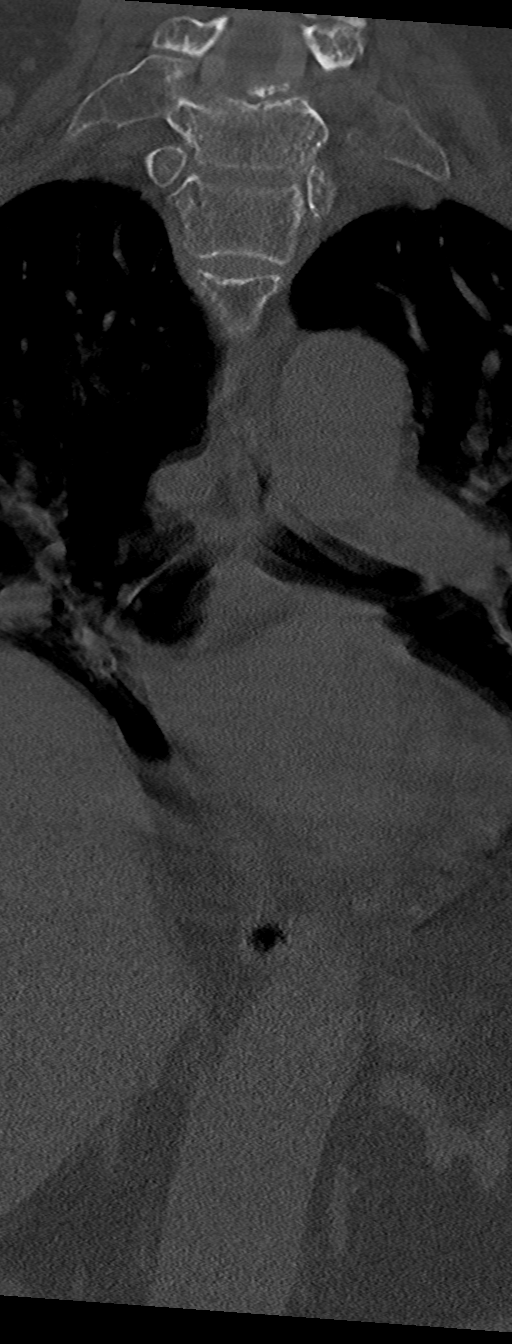
[im 24/60  bone]
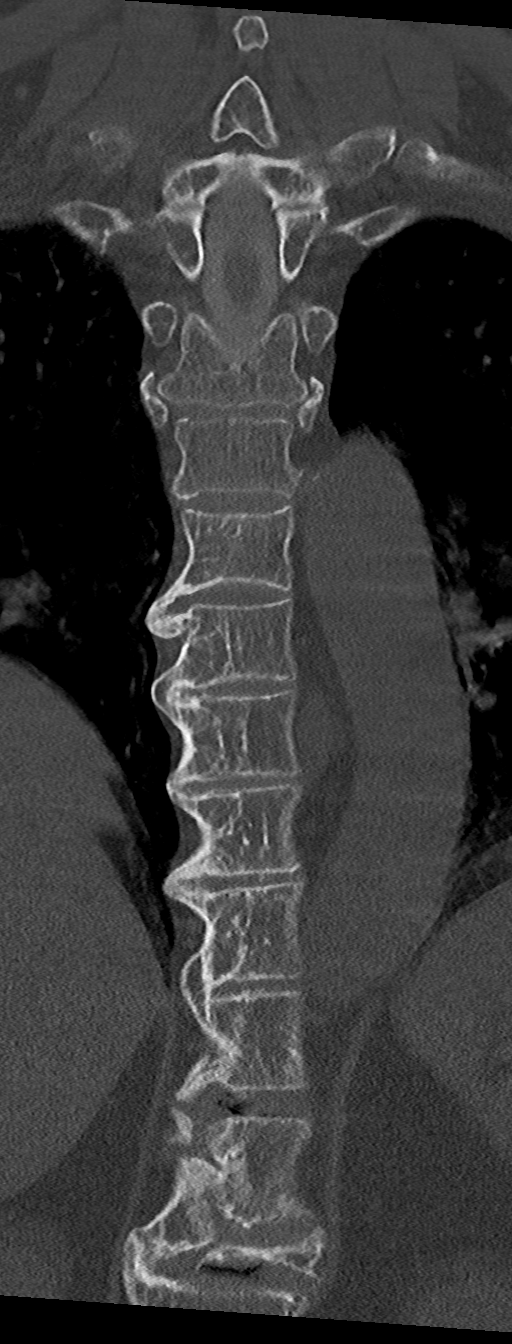
[im 36/60  bone]
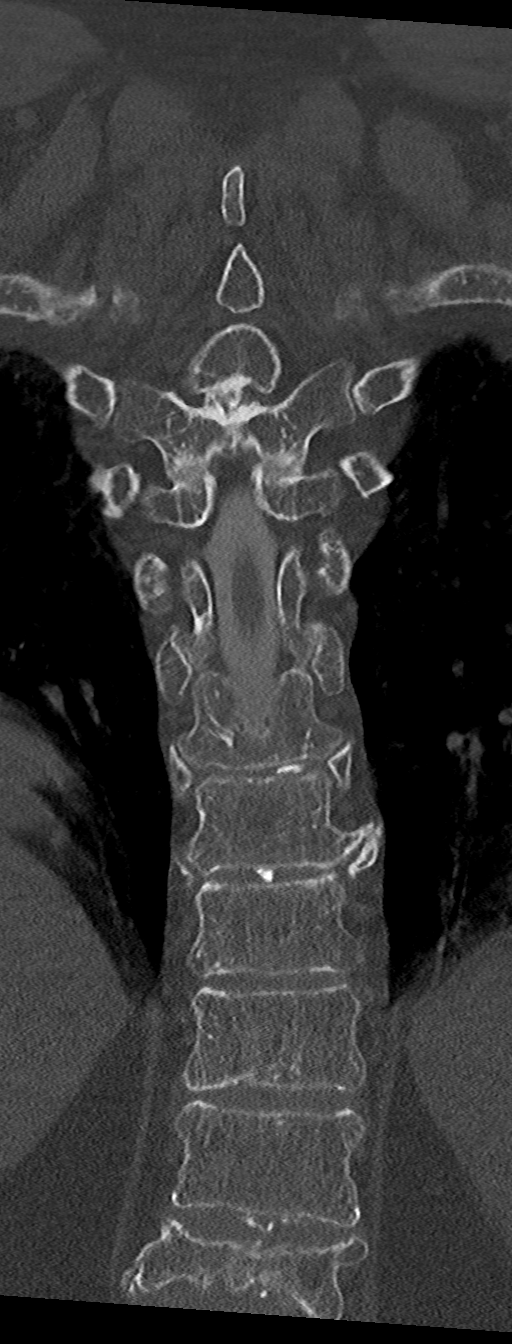

[8 of 33 positions shown; findings below may reference images not displayed]

FLUOROSCOPY TIME:  Reference procedure note

PROCEDURE:
LUMBAR PUNCTURE FOR CERVICAL LUMBAR AND THORACIC MYELOGRAM

CERVICAL AND LUMBAR AND THORACIC MYELOGRAM

CT CERVICAL MYELOGRAM

CT LUMBAR MYELOGRAM

CT THORACIC MYELOGRAM

Lumbar puncture and intrathecal contrast administration were
performed by Dr TANEISHA who will separately report for the portion of
the procedure. I personally supervised acquisition of the myelogram
images.
FINDINGS: CERVICAL AND LUMBAR MYELOGRAM FINDINGS:

Very limited by patient's immobility and gait difficulty. Contrast
would not advance beyond the lower thoracic spine in the prone
position and contrast had to be advanced to the foramen magnum in
the right lateral decubitus position. Due to limited yield, limited
diagnostic images were obtained.

CT CERVICAL MYELOGRAM FINDINGS:

Alignment: Exaggerated lordosis.  Mild C3-4 retrolisthesis

Vertebrae: No evidence of fracture or bone lesion. Negative for
erosion.

Cord: Normal morphology and bulk.

Extra-spinal: No evidence of inflammation or mass.

Disc levels:

C2-3: Mild facet spurring. Disc narrowing and mild ridging. No
neural impingement

C3-4: Disc narrowing with endplate and uncovertebral ridging
eccentric to the right where there is prominent uncovertebral
spurring and advanced right foraminal impingement. Left foraminal
narrowing is moderate. Mild spinal stenosis with effacement of
ventral subarachnoid space.

C4-5: Disc narrowing with endplate and uncovertebral ridging.
Advanced right and moderate left foraminal stenosis. Endplate
ridging effaces the ventral CSF and could mildly deforms the ventral
cord.

C5-6: Disc narrowing with gas containing cleft. Endplate and
uncovertebral ridging. Mild bilateral foraminal stenosis. Patent
spinal canal

C6-7: Disc narrowing and ridging. Negative facets. No neural
impingement

C7-T1:Minor facet spurring.  No impingement

CT LUMBAR MYELOGRAM FINDINGS:

Segmentation: 5 lumbar type vertebrae.

Alignment: Dextroscoliosis.

Vertebrae: Remote L3, L4, and L5 compression fractures with over 50%
height loss at L5. Solid posterior-lateral arthrodesis at L3-4 and
L4-5.

Conus: Tip terminates at L1-2.  Unremarkable cauda equina.

Extra-spinal: Bilateral renal sinus cysts. Asymmetric fatty atrophy
of the right psoas. Atrophy of intrinsic back muscles.

Disc levels:

T12- L1: Disc narrowing and posttraumatic deformity with foraminal
stenosis that is moderate to advanced on the left. Mild spinal
stenosis.

L1-L2: Degenerative facet spurring on the left more than right. Mild
disc narrowing and bulging. Moderate left foraminal stenosis

L2-L3: Disc narrowing asymmetric to the left where there is bulky
far-lateral spurring. Gas containing disc fissure. Chronic left
paracentral protrusions/ridging. Asymmetric left facet
osteoarthritis. Moderate spinal stenosis with asymmetric left L3
impingement in the subarticular recess. High-grade left foraminal
impingement.

L3-L4: Posterior-lateral arthrodesis which is solid.  No impingement

L4-L5: Posterior-lateral arthrodesis which is solid.  No impingement

L5-S1:Bulky degenerative facet spurring. Mild disc narrowing with
gas containing fissure. Mild to moderate bilateral foraminal
stenosis.

CT THORACIC MYELOGRAM FINDINGS:

Alignment: Negative for listhesis.  Generalized straightening.

Vertebrae: Generalized osteopenia. No evidence of recent fracture or
bone lesion. Remote compression fracture of T12 with advanced height
loss and moderate retropulsion. Possible remote osteophyte fracture
at T11-12.

Cord: Normal shape and morphology. There is a dorsal column
stimulator seen at T8 to T10-11, without cord impingement.

Extra-spinal: No acute or aggressive finding.

Disc levels:

Bridging osteophytes from T5-T10. Additional non bridging osteophyte
at T10-11 followed by bridging osteophytes at T11-12 and T12-L1. No
notable facet spurring.

Chronic calcified disc protrusion at T7-8 which contacts the ventral
cord. No cord compression.

Spurring and disc narrowing causes moderate bilateral foraminal
stenosis at T10-11.
IMPRESSION: Cervical spine:

1. Generalized disc degeneration with mild spinal stenosis at C3-4
to C5-6.
2. Advanced right and moderate left foraminal stenosis at C3-4 and
C4-5.

Thoracic spine:

1. Spondylosis with bridging osteophytes from T5 to T10.
2. Remote T12 compression fracture with advanced height loss and
mild retropulsion.
3. Chronic calcified disc protrusion at T7-8. No spinal stenosis
throughout the thoracic spine. Moderate bilateral foraminal
narrowing at T10-11.
4. Dorsal column stimulator at T11-T10 11.

Lumbar spine:

1. Multilevel degenerative disease with scoliosis. Remote L3, L4,
and L5 compression fractures.
2. T12-L1 advanced left foraminal impingement.
3. L2-3 moderate spinal stenosis with asymmetric left subarticular
recess effacement and L3 impingement. Moderate left foraminal
impingement at this level.
4. L3-4 and L4-5 solid posterior-lateral arthrodesis with widely
patent canal after laminectomy.
5. L5-S1 mild to moderate bilateral foraminal narrowing mainly from
facet spurring.

## 2020-10-19 IMAGING — RF DG MYELOGRAM 2+ REGIONS
6 series · 6 of 6 positions shown · non-contrast
Comparison: none

CLINICAL DATA: Severe mid back pain
TECHNIQUE: Contiguous axial images were obtained through the Cervical,
Thoracic, and Lumbar spine after the intrathecal infusion of
infusion. Coronal and sagittal reconstructions were obtained of the
axial image sets.

[Series 1: cp_standard · 0.25mm/px · 1 of 1 slices shown (1 of 2)]
[im 1/1]
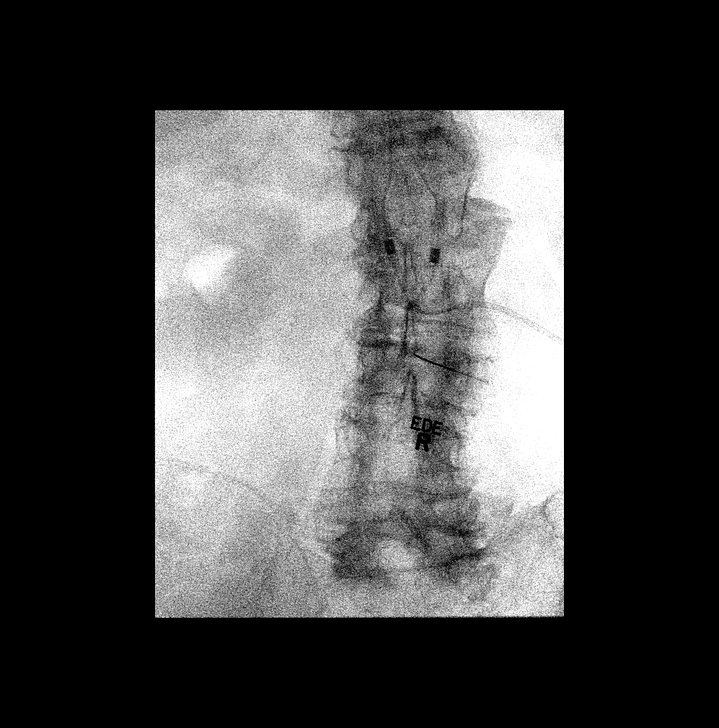

[Series 2: cp_standard · 0.26mm/px · 1 of 1 slices shown (2 of 2)]
[im 1/1]
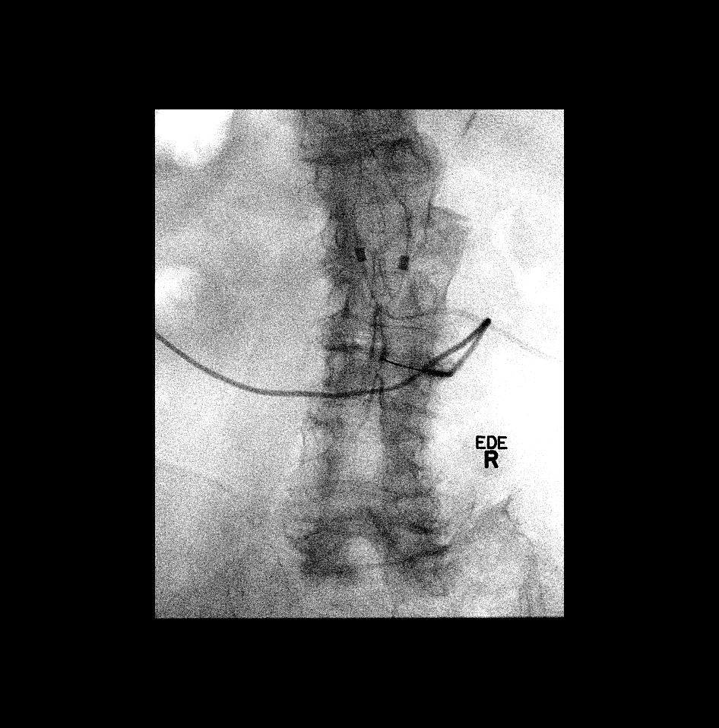

[Series 3: fluoro_myelogram_singleshot_bw · 0.17mm/px · 1 of 1 slices shown (1 of 4)]
[im 1/1]
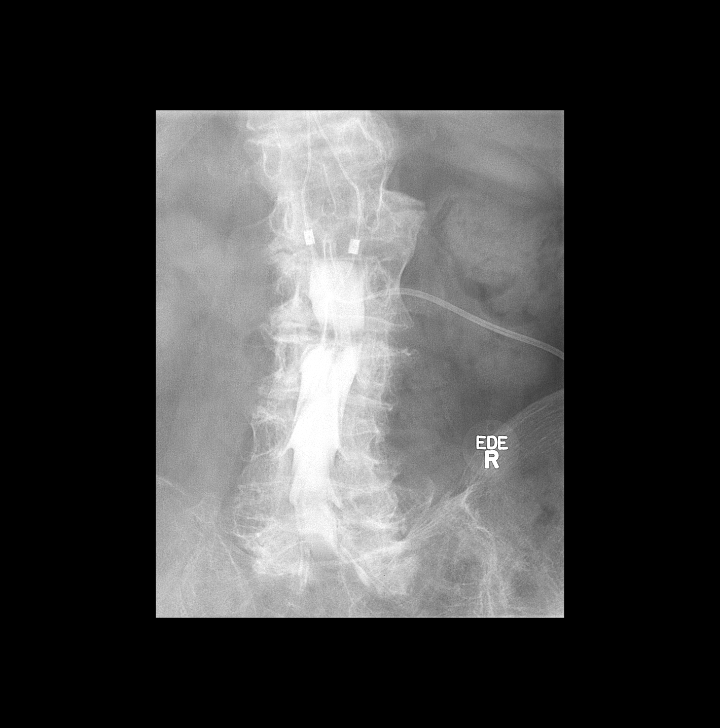

[Series 4: fluoro_myelogram_singleshot_bw · 0.17mm/px · 1 of 1 slices shown (2 of 4)]
[im 1/1]
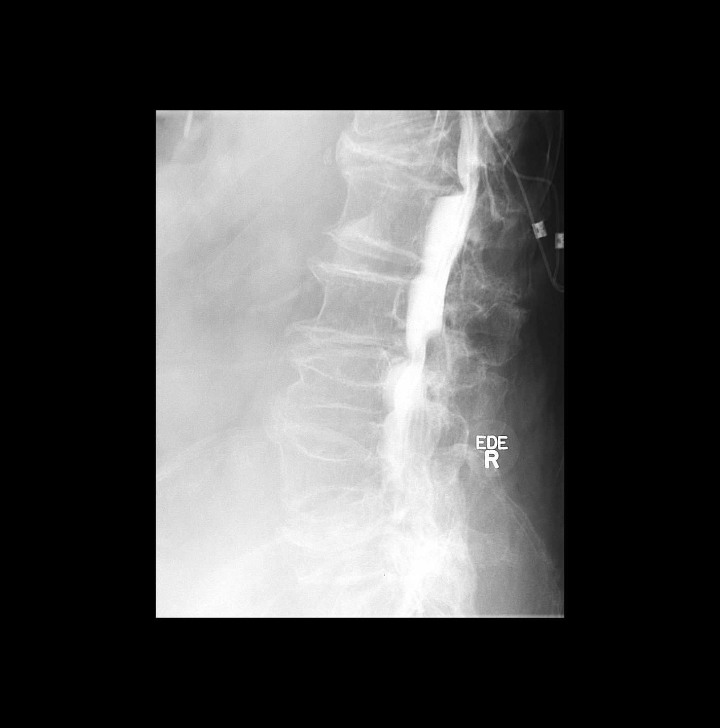

[Series 5: fluoro_myelogram_singleshot_bw · 0.17mm/px · 1 of 1 slices shown (3 of 4)]
[im 1/1]
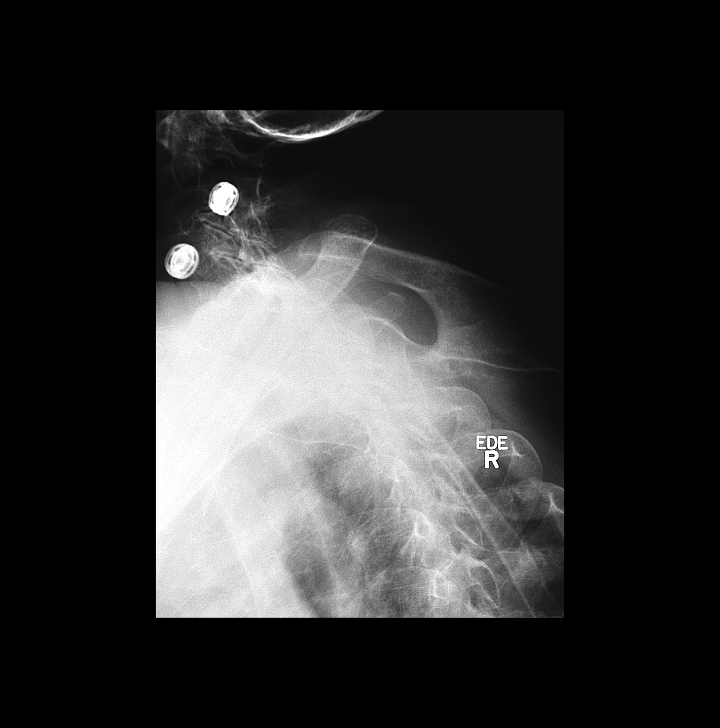

[Series 6: fluoro_myelogram_singleshot_bw · 0.17mm/px · 1 of 1 slices shown (4 of 4)]
[im 1/1]
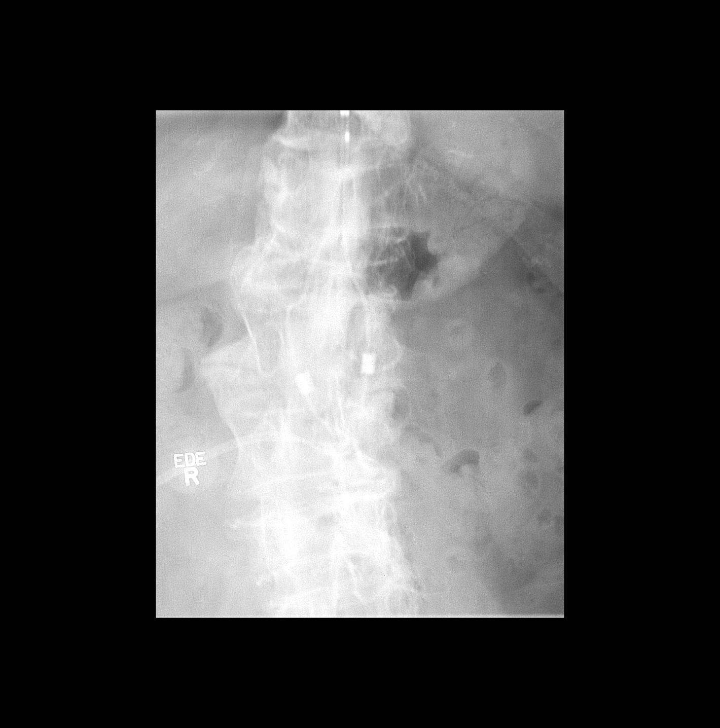

[6 of 6 positions shown; findings below may reference images not displayed]

FLUOROSCOPY TIME:  Reference procedure note

PROCEDURE:
LUMBAR PUNCTURE FOR CERVICAL LUMBAR AND THORACIC MYELOGRAM

CERVICAL AND LUMBAR AND THORACIC MYELOGRAM

CT CERVICAL MYELOGRAM

CT LUMBAR MYELOGRAM

CT THORACIC MYELOGRAM

Lumbar puncture and intrathecal contrast administration were
performed by Dr TANEISHA who will separately report for the portion of
the procedure. I personally supervised acquisition of the myelogram
images.
FINDINGS: CERVICAL AND LUMBAR MYELOGRAM FINDINGS:

Very limited by patient's immobility and gait difficulty. Contrast
would not advance beyond the lower thoracic spine in the prone
position and contrast had to be advanced to the foramen magnum in
the right lateral decubitus position. Due to limited yield, limited
diagnostic images were obtained.

CT CERVICAL MYELOGRAM FINDINGS:

Alignment: Exaggerated lordosis.  Mild C3-4 retrolisthesis

Vertebrae: No evidence of fracture or bone lesion. Negative for
erosion.

Cord: Normal morphology and bulk.

Extra-spinal: No evidence of inflammation or mass.

Disc levels:

C2-3: Mild facet spurring. Disc narrowing and mild ridging. No
neural impingement

C3-4: Disc narrowing with endplate and uncovertebral ridging
eccentric to the right where there is prominent uncovertebral
spurring and advanced right foraminal impingement. Left foraminal
narrowing is moderate. Mild spinal stenosis with effacement of
ventral subarachnoid space.

C4-5: Disc narrowing with endplate and uncovertebral ridging.
Advanced right and moderate left foraminal stenosis. Endplate
ridging effaces the ventral CSF and could mildly deforms the ventral
cord.

C5-6: Disc narrowing with gas containing cleft. Endplate and
uncovertebral ridging. Mild bilateral foraminal stenosis. Patent
spinal canal

C6-7: Disc narrowing and ridging. Negative facets. No neural
impingement

C7-T1:Minor facet spurring.  No impingement

CT LUMBAR MYELOGRAM FINDINGS:

Segmentation: 5 lumbar type vertebrae.

Alignment: Dextroscoliosis.

Vertebrae: Remote L3, L4, and L5 compression fractures with over 50%
height loss at L5. Solid posterior-lateral arthrodesis at L3-4 and
L4-5.

Conus: Tip terminates at L1-2.  Unremarkable cauda equina.

Extra-spinal: Bilateral renal sinus cysts. Asymmetric fatty atrophy
of the right psoas. Atrophy of intrinsic back muscles.

Disc levels:

T12- L1: Disc narrowing and posttraumatic deformity with foraminal
stenosis that is moderate to advanced on the left. Mild spinal
stenosis.

L1-L2: Degenerative facet spurring on the left more than right. Mild
disc narrowing and bulging. Moderate left foraminal stenosis

L2-L3: Disc narrowing asymmetric to the left where there is bulky
far-lateral spurring. Gas containing disc fissure. Chronic left
paracentral protrusions/ridging. Asymmetric left facet
osteoarthritis. Moderate spinal stenosis with asymmetric left L3
impingement in the subarticular recess. High-grade left foraminal
impingement.

L3-L4: Posterior-lateral arthrodesis which is solid.  No impingement

L4-L5: Posterior-lateral arthrodesis which is solid.  No impingement

L5-S1:Bulky degenerative facet spurring. Mild disc narrowing with
gas containing fissure. Mild to moderate bilateral foraminal
stenosis.

CT THORACIC MYELOGRAM FINDINGS:

Alignment: Negative for listhesis.  Generalized straightening.

Vertebrae: Generalized osteopenia. No evidence of recent fracture or
bone lesion. Remote compression fracture of T12 with advanced height
loss and moderate retropulsion. Possible remote osteophyte fracture
at T11-12.

Cord: Normal shape and morphology. There is a dorsal column
stimulator seen at T8 to T10-11, without cord impingement.

Extra-spinal: No acute or aggressive finding.

Disc levels:

Bridging osteophytes from T5-T10. Additional non bridging osteophyte
at T10-11 followed by bridging osteophytes at T11-12 and T12-L1. No
notable facet spurring.

Chronic calcified disc protrusion at T7-8 which contacts the ventral
cord. No cord compression.

Spurring and disc narrowing causes moderate bilateral foraminal
stenosis at T10-11.
IMPRESSION: Cervical spine:

1. Generalized disc degeneration with mild spinal stenosis at C3-4
to C5-6.
2. Advanced right and moderate left foraminal stenosis at C3-4 and
C4-5.

Thoracic spine:

1. Spondylosis with bridging osteophytes from T5 to T10.
2. Remote T12 compression fracture with advanced height loss and
mild retropulsion.
3. Chronic calcified disc protrusion at T7-8. No spinal stenosis
throughout the thoracic spine. Moderate bilateral foraminal
narrowing at T10-11.
4. Dorsal column stimulator at T11-T10 11.

Lumbar spine:

1. Multilevel degenerative disease with scoliosis. Remote L3, L4,
and L5 compression fractures.
2. T12-L1 advanced left foraminal impingement.
3. L2-3 moderate spinal stenosis with asymmetric left subarticular
recess effacement and L3 impingement. Moderate left foraminal
impingement at this level.
4. L3-4 and L4-5 solid posterior-lateral arthrodesis with widely
patent canal after laminectomy.
5. L5-S1 mild to moderate bilateral foraminal narrowing mainly from
facet spurring.

## 2020-10-19 MED ORDER — ONDANSETRON HCL 4 MG/2ML IJ SOLN: 4.0000 mg | Freq: Four times a day (QID) | INTRAMUSCULAR | Status: DC | PRN

## 2020-10-19 MED ORDER — HYDROCODONE-ACETAMINOPHEN 5-325 MG PO TABS: 1.0000 | ORAL_TABLET | ORAL | Status: DC | PRN

## 2020-10-19 MED ORDER — LIDOCAINE HCL (PF) 1 % IJ SOLN
5.0000 mL | Freq: Once | INTRAMUSCULAR | Status: AC
  Administered 2020-10-19: 5 mL via INTRADERMAL

## 2020-10-19 MED ORDER — IOHEXOL 300 MG/ML  SOLN
10.0000 mL | Freq: Once | INTRAMUSCULAR | Status: AC | PRN
  Administered 2020-10-19: 10 mL via INTRATHECAL

## 2020-10-19 MED ORDER — DIAZEPAM 5 MG PO TABS
10.0000 mg | ORAL_TABLET | Freq: Once | ORAL | Status: AC
  Administered 2020-10-19: 10 mg via ORAL
  Filled 2020-10-19: qty 2

## 2020-10-19 NOTE — Discharge Instructions (Signed)
Myelogram  A myelogram is an imaging test. This test checks for problems in the spinal cord and the places where nerves attach to the spinal cord (nerve roots). A dye (contrast material) is put into your spine before the X-ray. This provides a clearer image for your doctor to see. You may need this test if you have a spinal cord problem that cannot be diagnosed with other imaging tests. You may also have this test to check your spine after surgery. Tell a doctor about:  Any allergies you have, especially to iodine.  All medicines you are taking, including vitamins, herbs, eye drops, creams, and over-the-counter medicines.  Any problems you or family members have had with anesthetic medicines or dye.  Any blood disorders you have.  Any surgeries you have had.  Any medical conditions you have or have had, including asthma.  Whether you are pregnant or may be pregnant. What are the risks? Generally, this is a safe procedure. However, problems may occur, including:  Infection.  Bleeding.  Allergic reaction to medicines or dyes.  Damage to your spinal cord or nerves.  Leaking of spinal fluid. This can cause a headache.  Damage to kidneys.  Seizures. This is rare. What happens before the procedure?  Follow instructions from your doctor about what you cannot eat or drink. You may be asked to drink more fluids.  Ask your doctor about changing or stopping your normal medicines. This is important if you take diabetes medicines or blood thinners.  Plan to have someone take you home from the hospital or clinic.  If you will be going home right after the procedure, plan to have someone with you for 24 hours. What happens during the procedure?  You will lie face down on a table.  Your doctor will find the best injection site on your spine. This is most often in the lower back.  This area will be washed with soap.  You will be given a medicine to numb the area (local  anesthetic).  Your doctor will place a long needle into the space around your spinal cord.  A sample of spinal fluid may be taken. This may be sent to the lab for testing.  The dye will be injected into the space around your spinal cord.  The exam table may be tilted. This helps the dye flow up or down your spine.  The X-ray will take images of your spinal cord.  A bandage (dressing) may be placed over the area where the dye was injected. The procedure may vary among doctors and hospitals. What can I expect after the procedure?  You may be monitored until you leave the hospital or clinic. This includes checking your blood pressure, heart rate, breathing rate, and blood oxygen level.  You may feel sore at the injection site. You may have a mild headache.  You will be told to lie flat with your head raised (elevated). This lowers the risk of a headache.  It is up to you to get the results of your procedure. Ask your doctor, or the department that is doing the procedure, when your results will be ready. Follow these instructions at home:   Rest as told by your doctor. Lie flat with your head slightly elevated.  Do not bend, lift, or do hard work for 24-48 hours, or as told by your doctor.  Take over-the-counter and prescription medicines only as told by your doctor.  Take care of your bandage as told by your   doctor.  Drink enough fluid to keep your pee (urine) pale yellow.  Bathe or shower as told by your doctor. Contact a doctor if:  You have a fever.  You have a headache that lasts longer than 24 hours.  You feel sick to your stomach (nauseous).  You vomit.  Your neck is stiff.  Your legs feel numb.  You cannot pee.  You cannot poop (no bowel movement).  You have a rash.  You are itchy or sneezing. Get help right away if:  You have new symptoms or your symptoms get worse.  You have a seizure.  You have trouble breathing. Summary  A myelogram is an  imaging test that checks for problems in the spinal cord and the places where nerves attach to the spinal cord (nerve roots).  Before the procedure, follow instructions from your doctor. You will be told what not to eat or drink, or what medicines to change or stop.  After the procedure, you will be told to lie flat with your head raised (elevated). This will lower your risk of a headache.  Do not bend, lift, or do any hard work for 24-48 hours, or as told by your doctor.  Contact a doctor if you have a stiff neck or numb legs. Get help right away if your symptoms get worse, or you have a seizure or trouble breathing. This information is not intended to replace advice given to you by your health care provider. Make sure you discuss any questions you have with your health care provider. Document Revised: 02/10/2019 Document Reviewed: 02/11/2019 Elsevier Patient Education  2020 [employer]  

## 2020-10-19 NOTE — Procedures (Signed)
Amanda Mathews is a 74 year old individuals had difficulty with deterioration of her gait neck shoulder and arm pain and history of multiple compression fractures lumbar spine in the past secondary to osteoporosis.  She is developing increasing problems with walking and has symptoms of myelopathy with a wide-based gait and unsteadiness on her feet exact origin is difficult to note patient has had a spinal cord stimulator placed in the last 4 years and has not had any imaging studies of the spine because she cannot have an MRI with that device in place.  A total myelogram is now being performed to evaluate this process.  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  Further evaluation with CT scan of the entire neural axis.

## 2020-10-19 NOTE — Progress Notes (Signed)
Radiology called for orders

## 2020-10-19 NOTE — Progress Notes (Addendum)
Ambulated to the bathroom to void. Tol well. Discharge instructions reviewed with pt and her sister (via telephone) both voice understanding.

## 2020-10-25 DIAGNOSIS — M48062 Spinal stenosis, lumbar region with neurogenic claudication: Secondary | ICD-10-CM | POA: Diagnosis not present

## 2020-12-03 ENCOUNTER — Encounter: Payer: Self-pay | Admitting: Family Medicine

## 2020-12-04 ENCOUNTER — Other Ambulatory Visit: Payer: Medicare PPO

## 2020-12-04 ENCOUNTER — Telehealth (INDEPENDENT_AMBULATORY_CARE_PROVIDER_SITE_OTHER): Payer: Medicare PPO | Admitting: Physician Assistant

## 2020-12-04 ENCOUNTER — Encounter: Payer: Self-pay | Admitting: Physician Assistant

## 2020-12-04 VITALS — Ht 60.0 in | Wt 207.0 lb

## 2020-12-04 DIAGNOSIS — Z20822 Contact with and (suspected) exposure to covid-19: Secondary | ICD-10-CM | POA: Diagnosis not present

## 2020-12-04 DIAGNOSIS — J040 Acute laryngitis: Secondary | ICD-10-CM | POA: Diagnosis not present

## 2020-12-04 MED ORDER — AZITHROMYCIN 250 MG PO TABS: ORAL_TABLET | ORAL | 0 refills | Status: DC

## 2020-12-04 NOTE — Progress Notes (Signed)
Virtual Visit via Video   I connected with Amanda Mathews on 12/04/20 at 12:30 PM EST by a video enabled telemedicine application and verified that I am speaking with the correct person using two identifiers. Location patient: Home Location provider: [facility], Office Persons participating in the virtual visit: Alyse, Kathan Poindexter, Lelon Mast [place], Corky Mull, LPN   I discussed the limitations of evaluation and management by telemedicine and the availability of in person appointments. The patient expressed understanding and agreed to proceed.  I acted as a Neurosurgeon for [employer], PA-C [employer], LPN   Subjective:   HPI:   URI symptoms Pt started on Friday with slight cough, headache and then pt became hoarse. Her symptoms have overall resolved, except for her hoarse voice. She is taking Tylenol and Robitussin.  Denies: nasal congestion, fever, poor appetite, poor hydration, chills, cough, SOB, chest tightness.  She apparently gets this every year and typically treats with a z-pack.   No prior COPD or asthma issues at baseline. Does have albuterol to use as needed.   Planning to get tested for COVID this afternoon in our parking lot. She is fully vaccinated and has had a booster.  ROS: See pertinent positives and negatives per HPI.  Patient Active Problem List   Diagnosis Date Noted  . Chronic low back pain 07/17/2020  . Essential hypertension 07/17/2020  . Hypothyroidism 07/17/2020  . Dyslipidemia 07/17/2020  . Osteoporosis 07/17/2020  . 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 Osteoarthritis 07/17/2020  . OSA on CPAP 07/17/2020    Social History   Tobacco Use  . Smoking status: Never Smoker  . Smokeless tobacco: Never Used  Substance Use Topics  . Alcohol use: Never    Current Outpatient Medications:  .  acetaminophen (TYLENOL) 650 MG CR tablet, Take 650 mg by mouth every 8 (eight) hours  as needed for pain., Disp: , Rfl:  .  albuterol (VENTOLIN HFA) 108 (90 Base) MCG/ACT inhaler, Inhale 2 puffs into the lungs every 6 (six) hours as needed for wheezing or shortness of breath., Disp: , Rfl:  .  buPROPion (WELLBUTRIN XL) 300 MG 24 hr tablet, Take 300 mg by mouth daily., Disp: , Rfl:  .  Calcium Carb-Cholecalciferol (CALCIUM 600-D PO), Take 600 mg by mouth daily., Disp: , Rfl:  .  carbamazepine (TEGRETOL) 200 MG tablet, Take 200 mg by mouth 2 (two) times daily., Disp: , Rfl:  .  Cholecalciferol (VITAMIN D3) 125 MCG (5000 UT) CAPS, Take 5,000 Units by mouth daily., Disp: , Rfl:  .  denosumab (PROLIA) 60 MG/ML SOSY injection, Inject 60 mg into the skin every 6 (six) months., Disp: , Rfl:  .  etodolac (LODINE) 400 MG tablet, Take 400 mg by mouth 2 (two) times daily., Disp: , Rfl:  .  gabapentin (NEURONTIN) 300 MG capsule, Take 300 mg by mouth 2 (two) times daily. , Disp: , Rfl:  .  hydrochlorothiazide (HYDRODIURIL) 25 MG tablet, Take 25 mg by mouth daily., Disp: , Rfl:  .  levothyroxine (SYNTHROID) 150 MCG tablet, Take 150 mcg by mouth daily before breakfast., Disp: , Rfl:  .  MAGNESIUM PO, Take 1 tablet by mouth 2 (two) times daily., Disp: , Rfl:  .  methocarbamol (ROBAXIN) 750 MG tablet, TAKE 1 TABLET (750 MG TOTAL) BY MOUTH 2 (TWO) TIMES DAILY AS NEEDED FOR MUSCLE SPASMS (Patient taking differently: Take 750 mg by mouth 2 (two) times daily.), Disp: 60 tablet, Rfl:  1 .  NON FORMULARY, Bi-pap machine, Disp: , Rfl:  .  potassium chloride SA (KLOR-CON) 20 MEQ tablet, Take 20 mEq by mouth daily., Disp: , Rfl:  .  simvastatin (ZOCOR) 20 MG tablet, Take 20 mg by mouth at bedtime., Disp: , Rfl:  .  sulfamethoxazole-trimethoprim (BACTRIM DS) 800-160 MG tablet, Take 4 tablets by mouth See admin instructions. Take 4 tablets 1 hour prior to dental work, Disp: , Rfl:  .  tamsulosin (FLOMAX) 0.4 MG CAPS capsule, Take 0.4 mg by mouth daily. , Disp: , Rfl:  .  azithromycin (ZITHROMAX) 250 MG tablet,  Take two tablets on day 1, then one tablet daily x 4 days., Disp: 6 tablet, Rfl: 0  Allergies  Allergen Reactions  . Codeine Nausea And Vomiting  . Hydromorphone Nausea Only  . Morphine Nausea Only and Other (See Comments)    Hallucinations   . Ropinirole     hallucinations    Objective:   VITALS: Per patient if applicable, see vitals. GENERAL: Alert, appears well and in no acute distress. HEENT: Atraumatic, conjunctiva clear, no obvious abnormalities on inspection of external nose and ears. NECK: Normal movements of the head and neck. CARDIOPULMONARY: No increased WOB. Speaking in clear sentences. I:E ratio WNL.  MS: Moves all visible extremities without noticeable abnormality. PSYCH: Pleasant and cooperative, well-groomed. Speech normal rate and rhythm. Affect is appropriate. Insight and judgement are appropriate. Attention is focused, linear, and appropriate.  NEURO: CN grossly intact. Oriented as arrived to appointment on time with no prompting. Moves both UE equally.  SKIN: No obvious lesions, wounds, erythema, or cyanosis noted on face or hands.  Assessment and Plan:   Caedyn was seen today for hoarse.  Diagnoses and all orders for this visit:  Laryngitis  Other orders -     azithromycin (ZITHROMAX) 250 MG tablet; Take two tablets on day 1, then one tablet daily x 4 days.   No red flags on discussion, patient is not in any obvious distress during our visit. Discussed progression of most viral illness, and recommended supportive care at this point in time. I did however provide pocket rx for oral azithromycin should symptoms not improve as anticipated. Discussed over the counter supportive care options, with recommendations to push fluids and rest. Reviewed return precautions including new/worsening fever, SOB, new/worsening cough or other concerns.  Recommended need to self-quarantine and practice social distancing until symptoms resolve. Discussed current  recommendations for COVID testing -- she is coming this afternoon for testing. I recommend that patient follow-up if symptoms worsen or persist despite treatment x 7-10 days, sooner if needed.  I discussed the assessment and treatment plan with the patient. The patient was provided an opportunity to ask questions and all were answered. The patient agreed with the plan and demonstrated an understanding of the instructions.   The patient was advised to call back or seek an in-person evaluation if the symptoms worsen or if the condition fails to improve as anticipated.   CMA or LPN served as scribe during this visit. History, Physical, and Plan performed by medical provider. The above documentation has been reviewed and is accurate and complete.  [place], [place] 12/04/2020

## 2020-12-04 NOTE — Telephone Encounter (Signed)
Pt has appointment today.  

## 2020-12-06 ENCOUNTER — Other Ambulatory Visit: Payer: Self-pay | Admitting: Physician Assistant

## 2020-12-06 ENCOUNTER — Encounter: Payer: Self-pay | Admitting: Physician Assistant

## 2020-12-06 LAB — SARS-COV-2, NAA 2 DAY TAT

## 2020-12-06 LAB — NOVEL CORONAVIRUS, NAA: SARS-CoV-2, NAA: NOT DETECTED

## 2020-12-06 MED ORDER — BENZONATATE 100 MG PO CAPS: 100.0000 mg | ORAL_CAPSULE | Freq: Three times a day (TID) | ORAL | 1 refills | Status: DC | PRN

## 2020-12-11 ENCOUNTER — Other Ambulatory Visit: Payer: Self-pay | Admitting: Family Medicine

## 2021-01-15 DIAGNOSIS — M545 Low back pain, unspecified: Secondary | ICD-10-CM | POA: Diagnosis not present

## 2021-01-15 DIAGNOSIS — M25572 Pain in left ankle and joints of left foot: Secondary | ICD-10-CM | POA: Diagnosis not present

## 2021-01-15 DIAGNOSIS — G8929 Other chronic pain: Secondary | ICD-10-CM | POA: Diagnosis not present

## 2021-01-15 DIAGNOSIS — R2689 Other abnormalities of gait and mobility: Secondary | ICD-10-CM | POA: Diagnosis not present

## 2021-01-15 DIAGNOSIS — M47816 Spondylosis without myelopathy or radiculopathy, lumbar region: Secondary | ICD-10-CM | POA: Diagnosis not present

## 2021-01-15 DIAGNOSIS — M7742 Metatarsalgia, left foot: Secondary | ICD-10-CM | POA: Diagnosis not present

## 2021-01-15 DIAGNOSIS — L853 Xerosis cutis: Secondary | ICD-10-CM | POA: Diagnosis not present

## 2021-01-22 ENCOUNTER — Ambulatory Visit: Payer: Medicare PPO | Admitting: Family Medicine

## 2021-01-22 ENCOUNTER — Encounter: Payer: Self-pay | Admitting: Family Medicine

## 2021-01-23 ENCOUNTER — Ambulatory Visit (INDEPENDENT_AMBULATORY_CARE_PROVIDER_SITE_OTHER): Payer: Medicare PPO | Admitting: Family Medicine

## 2021-01-23 ENCOUNTER — Other Ambulatory Visit: Payer: Self-pay

## 2021-01-23 ENCOUNTER — Encounter: Payer: Self-pay | Admitting: Family Medicine

## 2021-01-23 VITALS — BP 125/78 | HR 82 | Temp 98.0°F | Ht 60.0 in | Wt 213.6 lb

## 2021-01-23 DIAGNOSIS — Z23 Encounter for immunization: Secondary | ICD-10-CM | POA: Diagnosis not present

## 2021-01-23 DIAGNOSIS — E876 Hypokalemia: Secondary | ICD-10-CM | POA: Insufficient documentation

## 2021-01-23 DIAGNOSIS — I10 Essential (primary) hypertension: Secondary | ICD-10-CM | POA: Diagnosis not present

## 2021-01-23 DIAGNOSIS — R739 Hyperglycemia, unspecified: Secondary | ICD-10-CM

## 2021-01-23 DIAGNOSIS — M810 Age-related osteoporosis without current pathological fracture: Secondary | ICD-10-CM | POA: Diagnosis not present

## 2021-01-23 DIAGNOSIS — E785 Hyperlipidemia, unspecified: Secondary | ICD-10-CM

## 2021-01-23 DIAGNOSIS — E039 Hypothyroidism, unspecified: Secondary | ICD-10-CM

## 2021-01-23 DIAGNOSIS — M1990 Unspecified osteoarthritis, unspecified site: Secondary | ICD-10-CM | POA: Diagnosis not present

## 2021-01-23 MED ORDER — MELOXICAM 7.5 MG PO TABS: 7.5000 mg | ORAL_TABLET | Freq: Every day | ORAL | 0 refills | Status: DC

## 2021-01-23 NOTE — Patient Instructions (Signed)
It was very nice to see you today!  Stop taking the flomax.  Please let me know if you have any urinary issues after stopping this.  Please etodolac.  Start meloxicam 7.5 mg daily.  We will give you your pneumonia vaccine today.  Please schedule your mammogram soon.  We will have you come back for prolia once we get it approved through your insurance.   We'll check blood work today.  I'll see you back in 6 months.  Come back to see me sooner if needed.  Take care, Dr [name]  Please try these tips to maintain a healthy lifestyle:   Eat at least 3 REAL meals and 1-2 snacks per day.  Aim for no more than 5 hours between eating.  If you eat breakfast, please do so within one hour of getting up.    Each meal should contain half fruits/vegetables, one quarter protein, and one quarter carbs (no bigger than a computer mouse)   Cut down on sweet beverages. This includes juice, soda, and sweet tea.     Drink at least 1 glass of water with each meal and aim for at least 8 glasses per day   Exercise at least 150 minutes every week.

## 2021-01-23 NOTE — Assessment & Plan Note (Addendum)
Check lipids.  Continue simvastatin 20 mg daily. ?

## 2021-01-23 NOTE — Assessment & Plan Note (Signed)
She is due for Prolia injection.  Will check insurance to get this set up.

## 2021-01-23 NOTE — Assessment & Plan Note (Signed)
Check TSH.  Continue Synthroid 150 mcg daily. 

## 2021-01-23 NOTE — Assessment & Plan Note (Signed)
We'll check CMET today.  She is currently on potassium supplementation.  Reportedly was hospitalized for this in the past but does not remember why her potassium is low.

## 2021-01-23 NOTE — Assessment & Plan Note (Signed)
Follows with orthopedics.  We'll stop her etodolac and start meloxicam.  Hopefully this will help some with lower extremity swelling that she will be on a small relative dose of NSAID.

## 2021-01-23 NOTE — Assessment & Plan Note (Signed)
At goal.  Continue HCTZ 25 mg daily

## 2021-01-23 NOTE — Progress Notes (Signed)
Amanda Mathews is a 75 y.o. female who presents today for an office visit.  Assessment/Plan:  Chronic Problems Addressed Today: Hypokalemia We'll check CMET today.  She is currently on potassium supplementation.  Reportedly was hospitalized for this in the past but does not remember why her potassium is low.  Osteoarthritis Follows with orthopedics.  We'll stop her etodolac and start meloxicam.  Hopefully this will help some with lower extremity swelling that she will be on a small relative dose of NSAID.  Osteoporosis She is due for Prolia injection.  Will check insurance to get this set up.  Dyslipidemia Check lipids.  Continue simvastatin 20 mg daily.  Hypothyroidism Check TSH.  Continue Synthroid 150 mcg daily.  Essential hypertension At goal.  Continue HCTZ 25 mg daily  Preventative health care Prevnar given today.  Gave contact information for mammogram.  Will check labs today also.     Subjective:  HPI:  See A/p.    ROS: Per HPI, otherwise a complete review of systems was negative.   PMH:  The following were reviewed and entered/updated in epic: Past Medical History:  Diagnosis Date  . Cataract   . Osteoporosis   . Sleep apnea   . Thyroid disease    Patient Active Problem List   Diagnosis Date Noted  . Hypokalemia 01/23/2021  . Chronic low back pain 07/17/2020  . Essential hypertension 07/17/2020  . Hypothyroidism 07/17/2020  . Dyslipidemia 07/17/2020  . Osteoporosis 07/17/2020  . 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 Osteoarthritis 07/17/2020  . OSA on CPAP 07/17/2020   Past Surgical History:  Procedure Laterality Date  . back stimulator     . BUNIONECTOMY    . CHOLECYSTECTOMY    . FEMUR FRACTURE SURGERY    . REPLACEMENT TOTAL KNEE BILATERAL      Family History  Problem Relation Age of Onset  . Hypertension Mother   . Stroke Mother   . Arthritis Sister   . COPD Sister   . Depression Sister   .  Diabetes Sister   . Alcohol abuse Brother   . Hypertension Sister     Medications- reviewed and updated Current Outpatient Medications  Medication Sig Dispense Refill  . acetaminophen (TYLENOL) 650 MG CR tablet Take 650 mg by mouth every 8 (eight) hours as needed for pain.    Marland Kitchen albuterol (VENTOLIN HFA) 108 (90 Base) MCG/ACT inhaler Inhale 2 puffs into the lungs every 6 (six) hours as needed for wheezing or shortness of breath.    Marland Kitchen buPROPion (WELLBUTRIN XL) 300 MG 24 hr tablet Take 300 mg by mouth daily.    . Calcium Carb-Cholecalciferol (CALCIUM 600-D PO) Take 600 mg by mouth daily.    . carbamazepine (TEGRETOL) 200 MG tablet Take 200 mg by mouth 2 (two) times daily.    . Cholecalciferol (VITAMIN D3) 125 MCG (5000 UT) CAPS Take 5,000 Units by mouth daily.    Marland Kitchen denosumab (PROLIA) 60 MG/ML SOSY injection Inject 60 mg into the skin every 6 (six) months.    . gabapentin (NEURONTIN) 300 MG capsule Take 300 mg by mouth 2 (two) times daily.     . hydrochlorothiazide (HYDRODIURIL) 25 MG tablet Take 25 mg by mouth daily.    Marland Kitchen levothyroxine (SYNTHROID) 150 MCG tablet Take 150 mcg by mouth daily before breakfast.    . MAGNESIUM PO Take 1 tablet by mouth 2 (two) times daily.    . meloxicam (MOBIC) 7.5 MG tablet Take 1 tablet (  7.5 mg total) by mouth daily. 30 tablet 0  . methocarbamol (ROBAXIN) 750 MG tablet TAKE 1 TABLET (750 MG TOTAL) BY MOUTH 2 (TWO) TIMES DAILY AS NEEDED FOR MUSCLE SPASMS 60 tablet 1  . NON FORMULARY Bi-pap machine    . potassium chloride SA (KLOR-CON) 20 MEQ tablet Take 20 mEq by mouth daily.    . simvastatin (ZOCOR) 20 MG tablet Take 20 mg by mouth at bedtime.    . sulfamethoxazole-trimethoprim (BACTRIM DS) 800-160 MG tablet Take 4 tablets by mouth See admin instructions. Take 4 tablets 1 hour prior to dental work     No current facility-administered medications for this visit.    Allergies-reviewed and updated Allergies  Allergen Reactions  . Codeine Nausea And Vomiting   . Hydromorphone Nausea Only  . Morphine Nausea Only and Other (See Comments)    Hallucinations   . Ropinirole     hallucinations    Social History   Socioeconomic History  . Marital status: Married    Spouse name: Not on file  . Number of children: Not on file  . Years of education: Not on file  . Highest education level: Not on file  Occupational History  . Occupation: Retired   Tobacco Use  . Smoking status: Never Smoker  . Smokeless tobacco: Never Used  Substance and Sexual Activity  . Alcohol use: Never  . Drug use: Never  . Sexual activity: Not Currently    Birth control/protection: Abstinence  Other Topics Concern  . Not on file  Social History Narrative  . Not on file   Social Determinants of Health   Financial Resource Strain: Low Risk   . Difficulty of Paying Living Expenses: Not hard at all  Food Insecurity: No Food Insecurity  . Worried About Programme researcher, broadcasting/film/video in the Last Year: Never true  . Ran Out of Food in the Last Year: Never true  Transportation Needs: No Transportation Needs  . Lack of Transportation (Medical): No  . Lack of Transportation (Non-Medical): No  Physical Activity: Sufficiently Active  . Days of Exercise per Week: 5 days  . Minutes of Exercise per Session: 30 min  Stress: No Stress Concern Present  . Feeling of Stress : Not at all  Social Connections: Moderately Isolated  . Frequency of Communication with Friends and Family: More than three times a week  . Frequency of Social Gatherings with Friends and Family: More than three times a week  . Attends Religious Services: More than 4 times per year  . Active Member of Clubs or Organizations: No  . Attends Banker Meetings: Never  . Marital Status: Divorced          Objective:  Physical Exam: BP 125/78   Pulse 82   Temp 98 F (36.7 C) (Temporal)   Ht 5' (1.524 m)   Wt 213 lb 9.6 oz (96.9 kg)   SpO2 (!) 71%   BMI 41.72 kg/m   Gen: No acute distress, resting  comfortably CV: Regular rate and rhythm with no murmurs appreciated Pulm: Normal work of breathing, clear to auscultation bilaterally with no crackles, wheezes, or rhonchi Neuro: Grossly normal, moves all extremities Psych: Normal affect and thought content      Jeanette Moffatt M. [name], MD 01/23/2021 2:57 PM

## 2021-01-24 LAB — LIPID PANEL
Cholesterol: 194 mg/dL (ref 0–200)
HDL: 63 mg/dL (ref >39.00)
LDL Cholesterol: 111 mg/dL — ABNORMAL HIGH (ref 0–99)
NonHDL: 130.81
Total CHOL/HDL Ratio: 3
Triglycerides: 101 mg/dL (ref 0.0–149.0)
VLDL: 20.2 mg/dL (ref 0.0–40.0)

## 2021-01-24 LAB — HEMOGLOBIN A1C: Hgb A1c MFr Bld: 5.3 % (ref 4.6–6.5)

## 2021-01-24 LAB — COMPREHENSIVE METABOLIC PANEL
ALT: 12 U/L (ref 0–35)
AST: 14 U/L (ref 0–37)
Albumin: 4.3 g/dL (ref 3.5–5.2)
Alkaline Phosphatase: 101 U/L (ref 39–117)
BUN: 23 mg/dL (ref 6–23)
CO2: 31 mEq/L (ref 19–32)
Calcium: 9.6 mg/dL (ref 8.4–10.5)
Chloride: 103 mEq/L (ref 96–112)
Creatinine, Ser: 1.24 mg/dL — ABNORMAL HIGH (ref 0.40–1.20)
GFR: 43.43 mL/min — ABNORMAL LOW (ref >60.00)
Glucose, Bld: 86 mg/dL (ref 70–99)
Potassium: 3.6 mEq/L (ref 3.5–5.1)
Sodium: 141 mEq/L (ref 135–145)
Total Bilirubin: 0.7 mg/dL (ref 0.2–1.2)
Total Protein: 6.7 g/dL (ref 6.0–8.3)

## 2021-01-24 LAB — CBC
HCT: 41.1 % (ref 36.0–46.0)
Hemoglobin: 13.8 g/dL (ref 12.0–15.0)
MCHC: 33.5 g/dL (ref 30.0–36.0)
MCV: 92.9 fl (ref 78.0–100.0)
Platelets: 233 10*3/uL (ref 150.0–400.0)
RBC: 4.43 Mil/uL (ref 3.87–5.11)
RDW: 13.4 % (ref 11.5–15.5)
WBC: 7.6 10*3/uL (ref 4.0–10.5)

## 2021-01-24 LAB — TSH: TSH: 1.8 u[IU]/mL (ref 0.35–4.50)

## 2021-01-25 NOTE — Progress Notes (Signed)
Please inform patient of the following:  Labs are all stable. Would like for her to continue her current treatment plan and we can recheck in a year.  Katina Degree. [name], MD 01/25/2021 12:53 PM

## 2021-01-29 DIAGNOSIS — H31011 Macula scars of posterior pole (postinflammatory) (post-traumatic), right eye: Secondary | ICD-10-CM | POA: Diagnosis not present

## 2021-01-29 DIAGNOSIS — H5213 Myopia, bilateral: Secondary | ICD-10-CM | POA: Diagnosis not present

## 2021-01-29 DIAGNOSIS — Z961 Presence of intraocular lens: Secondary | ICD-10-CM | POA: Diagnosis not present

## 2021-01-29 DIAGNOSIS — H53411 Scotoma involving central area, right eye: Secondary | ICD-10-CM | POA: Diagnosis not present

## 2021-02-02 ENCOUNTER — Encounter: Payer: Self-pay | Admitting: Family Medicine

## 2021-02-06 ENCOUNTER — Encounter: Payer: Self-pay | Admitting: Family Medicine

## 2021-02-08 DIAGNOSIS — M545 Low back pain, unspecified: Secondary | ICD-10-CM | POA: Diagnosis not present

## 2021-02-08 DIAGNOSIS — M47816 Spondylosis without myelopathy or radiculopathy, lumbar region: Secondary | ICD-10-CM | POA: Diagnosis not present

## 2021-02-16 ENCOUNTER — Other Ambulatory Visit: Payer: Self-pay | Admitting: Family Medicine

## 2021-02-19 ENCOUNTER — Other Ambulatory Visit: Payer: Self-pay | Admitting: Family Medicine

## 2021-02-27 ENCOUNTER — Other Ambulatory Visit: Payer: Self-pay | Admitting: *Deleted

## 2021-02-27 DIAGNOSIS — M810 Age-related osteoporosis without current pathological fracture: Secondary | ICD-10-CM

## 2021-02-27 NOTE — Progress Notes (Signed)
Referral placed.

## 2021-03-15 ENCOUNTER — Other Ambulatory Visit: Payer: Self-pay | Admitting: Family Medicine

## 2021-03-15 DIAGNOSIS — Z1231 Encounter for screening mammogram for malignant neoplasm of breast: Secondary | ICD-10-CM

## 2021-03-28 DIAGNOSIS — D225 Melanocytic nevi of trunk: Secondary | ICD-10-CM | POA: Diagnosis not present

## 2021-03-28 DIAGNOSIS — L538 Other specified erythematous conditions: Secondary | ICD-10-CM | POA: Diagnosis not present

## 2021-03-28 DIAGNOSIS — Z85828 Personal history of other malignant neoplasm of skin: Secondary | ICD-10-CM | POA: Diagnosis not present

## 2021-03-28 DIAGNOSIS — L853 Xerosis cutis: Secondary | ICD-10-CM | POA: Diagnosis not present

## 2021-03-28 DIAGNOSIS — L820 Inflamed seborrheic keratosis: Secondary | ICD-10-CM | POA: Diagnosis not present

## 2021-03-28 DIAGNOSIS — Z08 Encounter for follow-up examination after completed treatment for malignant neoplasm: Secondary | ICD-10-CM | POA: Diagnosis not present

## 2021-04-14 ENCOUNTER — Other Ambulatory Visit: Payer: Self-pay | Admitting: Family Medicine

## 2021-04-17 ENCOUNTER — Other Ambulatory Visit: Payer: Self-pay | Admitting: Family Medicine

## 2021-05-04 ENCOUNTER — Ambulatory Visit: Payer: Medicare PPO

## 2021-05-15 DIAGNOSIS — M47816 Spondylosis without myelopathy or radiculopathy, lumbar region: Secondary | ICD-10-CM | POA: Diagnosis not present

## 2021-05-15 DIAGNOSIS — M545 Low back pain, unspecified: Secondary | ICD-10-CM | POA: Diagnosis not present

## 2021-05-15 DIAGNOSIS — G8929 Other chronic pain: Secondary | ICD-10-CM | POA: Diagnosis not present

## 2021-05-15 DIAGNOSIS — R269 Unspecified abnormalities of gait and mobility: Secondary | ICD-10-CM | POA: Diagnosis not present

## 2021-05-15 DIAGNOSIS — R600 Localized edema: Secondary | ICD-10-CM | POA: Diagnosis not present

## 2021-05-15 DIAGNOSIS — M25572 Pain in left ankle and joints of left foot: Secondary | ICD-10-CM | POA: Diagnosis not present

## 2021-05-17 ENCOUNTER — Other Ambulatory Visit: Payer: Self-pay | Admitting: Family Medicine

## 2021-06-04 ENCOUNTER — Telehealth: Payer: Self-pay

## 2021-06-04 MED ORDER — LEVOTHYROXINE SODIUM 150 MCG PO TABS
150.0000 ug | ORAL_TABLET | Freq: Every day | ORAL | 1 refills | Status: DC
Start: 2021-06-04 — End: 2021-11-07

## 2021-06-04 NOTE — Telephone Encounter (Signed)
Rx sent to pharmacy   

## 2021-06-04 NOTE — Telephone Encounter (Addendum)
LAST APPOINTMENT DATE: 01/23/2021   NEXT APPOINTMENT DATE:@10 /28/2022  MEDICATION:levothyroxine (SYNTHROID) 150 MCG tablet  PHARMACY: [id number] - , [place] - [address] AT [facility]  Comments: Patient is completely out.   Please advise

## 2021-06-11 ENCOUNTER — Telehealth: Payer: Self-pay

## 2021-06-11 ENCOUNTER — Other Ambulatory Visit: Payer: Self-pay

## 2021-06-11 MED ORDER — SIMVASTATIN 20 MG PO TABS
20.0000 mg | ORAL_TABLET | Freq: Every day | ORAL | 3 refills | Status: DC
Start: 2021-06-11 — End: 2022-05-29

## 2021-06-11 NOTE — Telephone Encounter (Signed)
Called and lm on pt vm tcb, please see what pharmacy pt would like to use and if she wants a 30 or 90 day supply.

## 2021-06-11 NOTE — Telephone Encounter (Signed)
[id number] - [place], [place] - [address] AT [facility] Phone:  [phone]  Fax:  [phone]      Patient would like 90 days supply sent in.

## 2021-06-11 NOTE — Telephone Encounter (Signed)
Pt called in requesting a refill for simvastatin

## 2021-06-11 NOTE — Telephone Encounter (Signed)
Refill sent.

## 2021-06-17 ENCOUNTER — Other Ambulatory Visit: Payer: Self-pay | Admitting: Family Medicine

## 2021-06-20 ENCOUNTER — Encounter: Payer: Self-pay | Admitting: Family Medicine

## 2021-06-20 DIAGNOSIS — R269 Unspecified abnormalities of gait and mobility: Secondary | ICD-10-CM | POA: Diagnosis not present

## 2021-06-22 ENCOUNTER — Ambulatory Visit: Payer: Medicare PPO

## 2021-06-28 ENCOUNTER — Other Ambulatory Visit: Payer: Self-pay | Admitting: Family Medicine

## 2021-06-29 ENCOUNTER — Telehealth: Payer: Self-pay | Admitting: *Deleted

## 2021-06-29 NOTE — Telephone Encounter (Signed)
I dont believe we discussed this at her last visit. Can we clarify who it was that precribed it previously and what for?  Amanda Mathews. [name], MD 06/29/2021 3:42 PM

## 2021-06-29 NOTE — Telephone Encounter (Signed)
Pharmacy requesting refill Rx Carbamazepine   LAST APPOINTMENT DATE: 01/23/2021   NEXT APPOINTMENT DATE: 10/12/2021    Last refill by historical provider

## 2021-07-01 ENCOUNTER — Other Ambulatory Visit: Payer: Self-pay | Admitting: Family Medicine

## 2021-07-02 NOTE — Telephone Encounter (Signed)
Called and spoke with pt and she states this medication is prescribed by neurology, she will call them for the refill.

## 2021-07-05 ENCOUNTER — Other Ambulatory Visit: Payer: Self-pay | Admitting: Family Medicine

## 2021-08-03 ENCOUNTER — Other Ambulatory Visit: Payer: Self-pay | Admitting: Family Medicine

## 2021-08-07 DIAGNOSIS — Z1231 Encounter for screening mammogram for malignant neoplasm of breast: Secondary | ICD-10-CM | POA: Diagnosis not present

## 2021-08-07 DIAGNOSIS — Z01411 Encounter for gynecological examination (general) (routine) with abnormal findings: Secondary | ICD-10-CM | POA: Diagnosis not present

## 2021-08-07 DIAGNOSIS — B372 Candidiasis of skin and nail: Secondary | ICD-10-CM | POA: Diagnosis not present

## 2021-08-07 DIAGNOSIS — Z01419 Encounter for gynecological examination (general) (routine) without abnormal findings: Secondary | ICD-10-CM | POA: Diagnosis not present

## 2021-08-08 ENCOUNTER — Telehealth: Payer: Self-pay

## 2021-08-08 ENCOUNTER — Other Ambulatory Visit: Payer: Self-pay

## 2021-08-08 MED ORDER — BUPROPION HCL ER (XL) 300 MG PO TB24: 300.0000 mg | ORAL_TABLET | Freq: Every day | ORAL | 1 refills | Status: DC

## 2021-08-08 NOTE — Telephone Encounter (Signed)
LAST APPOINTMENT DATE:  01/23/21  NEXT APPOINTMENT DATE: 09/17/21  MEDICATION:buPROPion (WELLBUTRIN XL) 300 MG 24 hr tablet  PHARMACY:[id number] - [place], [place] - [address] AT [facility]

## 2021-08-08 NOTE — Telephone Encounter (Signed)
Refill sent to pharmacy.   

## 2021-08-14 DIAGNOSIS — M25562 Pain in left knee: Secondary | ICD-10-CM | POA: Diagnosis not present

## 2021-08-14 DIAGNOSIS — Z9889 Other specified postprocedural states: Secondary | ICD-10-CM | POA: Diagnosis not present

## 2021-08-14 DIAGNOSIS — M25561 Pain in right knee: Secondary | ICD-10-CM | POA: Diagnosis not present

## 2021-08-15 DIAGNOSIS — M2170 Unequal limb length (acquired), unspecified site: Secondary | ICD-10-CM | POA: Diagnosis not present

## 2021-08-15 DIAGNOSIS — L03031 Cellulitis of right toe: Secondary | ICD-10-CM | POA: Diagnosis not present

## 2021-08-15 DIAGNOSIS — R2689 Other abnormalities of gait and mobility: Secondary | ICD-10-CM | POA: Diagnosis not present

## 2021-08-16 ENCOUNTER — Other Ambulatory Visit: Payer: Self-pay | Admitting: Family Medicine

## 2021-08-21 ENCOUNTER — Other Ambulatory Visit: Payer: Self-pay | Admitting: Family Medicine

## 2021-08-21 DIAGNOSIS — Z13820 Encounter for screening for osteoporosis: Secondary | ICD-10-CM

## 2021-08-24 ENCOUNTER — Ambulatory Visit
Admission: RE | Admit: 2021-08-24 | Discharge: 2021-08-24 | Disposition: A | Discharge status: Home / Self Care | Payer: Medicare PPO | Source: Ambulatory Visit | Attending: Family Medicine | Admitting: Family Medicine

## 2021-08-24 ENCOUNTER — Other Ambulatory Visit: Payer: Self-pay

## 2021-08-24 DIAGNOSIS — M85832 Other specified disorders of bone density and structure, left forearm: Secondary | ICD-10-CM | POA: Diagnosis not present

## 2021-08-24 DIAGNOSIS — Z780 Asymptomatic menopausal state: Secondary | ICD-10-CM | POA: Diagnosis not present

## 2021-08-24 DIAGNOSIS — Z13820 Encounter for screening for osteoporosis: Secondary | ICD-10-CM

## 2021-08-24 DIAGNOSIS — M810 Age-related osteoporosis without current pathological fracture: Secondary | ICD-10-CM | POA: Diagnosis not present

## 2021-08-27 NOTE — Progress Notes (Signed)
Please inform patient of the following:  Bone density scan shows stable osteoporosis. We should continye her prolia and we can recheck again in 2 years.

## 2021-08-30 ENCOUNTER — Ambulatory Visit (INDEPENDENT_AMBULATORY_CARE_PROVIDER_SITE_OTHER): Payer: Medicare PPO

## 2021-08-30 ENCOUNTER — Encounter: Payer: Self-pay | Admitting: Family Medicine

## 2021-08-30 DIAGNOSIS — Z23 Encounter for immunization: Secondary | ICD-10-CM

## 2021-09-04 DIAGNOSIS — R269 Unspecified abnormalities of gait and mobility: Secondary | ICD-10-CM | POA: Diagnosis not present

## 2021-09-04 DIAGNOSIS — M545 Low back pain, unspecified: Secondary | ICD-10-CM | POA: Diagnosis not present

## 2021-09-04 DIAGNOSIS — M47816 Spondylosis without myelopathy or radiculopathy, lumbar region: Secondary | ICD-10-CM | POA: Diagnosis not present

## 2021-09-04 DIAGNOSIS — G8929 Other chronic pain: Secondary | ICD-10-CM | POA: Diagnosis not present

## 2021-09-08 ENCOUNTER — Other Ambulatory Visit: Payer: Self-pay | Admitting: Family Medicine

## 2021-09-17 ENCOUNTER — Ambulatory Visit: Payer: Medicare PPO | Admitting: Family Medicine

## 2021-09-17 ENCOUNTER — Other Ambulatory Visit: Payer: Self-pay | Admitting: Family Medicine

## 2021-09-17 ENCOUNTER — Encounter: Payer: Self-pay | Admitting: Family Medicine

## 2021-09-17 ENCOUNTER — Other Ambulatory Visit: Payer: Self-pay

## 2021-09-17 VITALS — BP 120/80 | HR 75 | Temp 98.3°F | Ht 60.0 in | Wt 202.6 lb

## 2021-09-17 DIAGNOSIS — E785 Hyperlipidemia, unspecified: Secondary | ICD-10-CM

## 2021-09-17 DIAGNOSIS — R739 Hyperglycemia, unspecified: Secondary | ICD-10-CM

## 2021-09-17 DIAGNOSIS — M810 Age-related osteoporosis without current pathological fracture: Secondary | ICD-10-CM

## 2021-09-17 DIAGNOSIS — I10 Essential (primary) hypertension: Secondary | ICD-10-CM

## 2021-09-17 DIAGNOSIS — F325 Major depressive disorder, single episode, in full remission: Secondary | ICD-10-CM

## 2021-09-17 DIAGNOSIS — E039 Hypothyroidism, unspecified: Secondary | ICD-10-CM

## 2021-09-17 NOTE — Assessment & Plan Note (Signed)
At goal on HCTZ 25 mg daily. ?

## 2021-09-17 NOTE — Progress Notes (Signed)
   Amanda Mathews is a 75 y.o. female who presents today for an office visit.  Assessment/Plan:  Chronic Problems Addressed Today: Depression, major, single episode, complete remission (HCC) Stable on Wellbutrin 300 mg daily.  Osteoporosis She is due for Prolia.  We will check with the insurance  Dyslipidemia Check labs.  Continue simvastatin 20 mg daily.  Hypothyroidism Check TSH.  She is on Synthroid 150 mcg daily.  Essential hypertension At goal on HCTZ 25 mg daily.    Subjective:  HPI:  See A/p for status of chronic conditions.         Objective:  Physical Exam: BP 120/80   Pulse 75   Temp 98.3 F (36.8 C) (Temporal)   Ht 5' (1.524 m)   Wt 202 lb 9.6 oz (91.9 kg)   SpO2 94%   BMI 39.57 kg/m   Gen: No acute distress, resting comfortably CV: Regular rate and rhythm with no murmurs appreciated Pulm: Normal work of breathing, clear to auscultation bilaterally with no crackles, wheezes, or rhonchi Neuro: Grossly normal, moves all extremities Psych: Normal affect and thought content      Amanda Mathews M. [name], MD 09/17/2021 2:50 PM

## 2021-09-17 NOTE — Assessment & Plan Note (Signed)
Check labs.  Continue simvastatin 20 mg daily. 

## 2021-09-17 NOTE — Assessment & Plan Note (Signed)
Check TSH.  She is on Synthroid 150 mcg daily. 

## 2021-09-17 NOTE — Assessment & Plan Note (Signed)
Stable on Wellbutrin 300 mg daily. 

## 2021-09-17 NOTE — Patient Instructions (Signed)
It was very nice to see you today!  No changes today.  Please keep an eye on your blood pressure and let me know if it is persistently 140/90 or higher.  We will check blood work today.  I will see you back in 6 months.  Please come back to see me sooner if needed.  Take care, Dr [name]  PLEASE NOTE:  If you had any lab tests please let us know if you have not heard back within a few days. You may see your results on mychart before we have a chance to review them but we will give you a call once they are reviewed by [place]. If we ordered any referrals today, please let us know if you have not heard from their office within the next week.   Please try these tips to maintain a healthy lifestyle:  Eat at least 3 REAL meals and 1-2 snacks per day.  Aim for no more than 5 hours between eating.  If you eat breakfast, please do so within one hour of getting up.   Each meal should contain half fruits/vegetables, one quarter protein, and one quarter carbs (no bigger than a computer mouse)  Cut down on sweet beverages. This includes juice, soda, and sweet tea.   Drink at least 1 glass of water with each meal and aim for at least 8 glasses per day  Exercise at least 150 minutes every week.

## 2021-09-17 NOTE — Assessment & Plan Note (Signed)
She is due for Prolia.  We will check with the insurance

## 2021-09-18 ENCOUNTER — Telehealth: Payer: Self-pay

## 2021-09-18 LAB — COMPREHENSIVE METABOLIC PANEL
ALT: 12 U/L (ref 0–35)
AST: 15 U/L (ref 0–37)
Albumin: 4.2 g/dL (ref 3.5–5.2)
Alkaline Phosphatase: 114 U/L (ref 39–117)
BUN: 15 mg/dL (ref 6–23)
CO2: 28 mEq/L (ref 19–32)
Calcium: 9.4 mg/dL (ref 8.4–10.5)
Chloride: 103 mEq/L (ref 96–112)
Creatinine, Ser: 1.11 mg/dL (ref 0.40–1.20)
GFR: 49.38 mL/min — ABNORMAL LOW (ref >60.00)
Glucose, Bld: 85 mg/dL (ref 70–99)
Potassium: 3.8 mEq/L (ref 3.5–5.1)
Sodium: 141 mEq/L (ref 135–145)
Total Bilirubin: 0.5 mg/dL (ref 0.2–1.2)
Total Protein: 6.4 g/dL (ref 6.0–8.3)

## 2021-09-18 LAB — CBC
HCT: 42.2 % (ref 36.0–46.0)
Hemoglobin: 14.2 g/dL (ref 12.0–15.0)
MCHC: 33.6 g/dL (ref 30.0–36.0)
MCV: 92.9 fl (ref 78.0–100.0)
Platelets: 218 10*3/uL (ref 150.0–400.0)
RBC: 4.55 Mil/uL (ref 3.87–5.11)
RDW: 13.5 % (ref 11.5–15.5)
WBC: 5.7 10*3/uL (ref 4.0–10.5)

## 2021-09-18 LAB — LIPID PANEL
Cholesterol: 192 mg/dL (ref 0–200)
HDL: 58.1 mg/dL (ref >39.00)
LDL Cholesterol: 106 mg/dL — ABNORMAL HIGH (ref 0–99)
NonHDL: 134.24
Total CHOL/HDL Ratio: 3
Triglycerides: 139 mg/dL (ref 0.0–149.0)
VLDL: 27.8 mg/dL (ref 0.0–40.0)

## 2021-09-18 LAB — HEMOGLOBIN A1C: Hgb A1c MFr Bld: 5.3 % (ref 4.6–6.5)

## 2021-09-18 LAB — TSH: TSH: 0.95 u[IU]/mL (ref 0.35–5.50)

## 2021-09-18 NOTE — Telephone Encounter (Signed)
Prolia PA done waiting on determination.

## 2021-09-19 NOTE — Progress Notes (Signed)
Please inform patient of the following:  Labs are all STABLE. Do not need to make any changes to her treatment plan at this time. We can recheck in 6-12 months.   Katina Degree. [name], MD 09/19/2021 7:55 AM

## 2021-09-21 DIAGNOSIS — R600 Localized edema: Secondary | ICD-10-CM | POA: Diagnosis not present

## 2021-09-21 DIAGNOSIS — M25572 Pain in left ankle and joints of left foot: Secondary | ICD-10-CM | POA: Diagnosis not present

## 2021-09-21 DIAGNOSIS — R2689 Other abnormalities of gait and mobility: Secondary | ICD-10-CM | POA: Diagnosis not present

## 2021-09-21 DIAGNOSIS — M1990 Unspecified osteoarthritis, unspecified site: Secondary | ICD-10-CM | POA: Diagnosis not present

## 2021-09-21 DIAGNOSIS — L03031 Cellulitis of right toe: Secondary | ICD-10-CM | POA: Diagnosis not present

## 2021-09-24 NOTE — Telephone Encounter (Signed)
PA Case: [id number], Status: Approved, Coverage Starts on: 12/16/2021 12:00:00 AM, Coverage Ends on: 12/15/2022 12:00:00 AM. Questions? Contact [phone].

## 2021-09-27 NOTE — Telephone Encounter (Addendum)
PA Approved Case # [id number]  06/08/20 - 12/15/21    $0 dollars  Ready to schedule

## 2021-10-09 ENCOUNTER — Ambulatory Visit: Payer: Medicare PPO

## 2021-10-10 ENCOUNTER — Ambulatory Visit (INDEPENDENT_AMBULATORY_CARE_PROVIDER_SITE_OTHER): Payer: Medicare PPO

## 2021-10-10 ENCOUNTER — Other Ambulatory Visit: Payer: Self-pay | Admitting: Family Medicine

## 2021-10-10 ENCOUNTER — Other Ambulatory Visit: Payer: Self-pay

## 2021-10-10 DIAGNOSIS — M810 Age-related osteoporosis without current pathological fracture: Secondary | ICD-10-CM | POA: Diagnosis not present

## 2021-10-10 MED ORDER — DENOSUMAB 60 MG/ML ~~LOC~~ SOSY
60.0000 mg | PREFILLED_SYRINGE | Freq: Once | SUBCUTANEOUS | Status: AC
  Administered 2021-10-10: 60 mg via SUBCUTANEOUS

## 2021-10-10 NOTE — Progress Notes (Signed)
After obtaining consent, and per orders of Dr. [name] , injection of Prolia given by Erick Alley. Patient tolerated injection well.

## 2021-10-12 ENCOUNTER — Ambulatory Visit (INDEPENDENT_AMBULATORY_CARE_PROVIDER_SITE_OTHER): Payer: Medicare PPO

## 2021-10-12 DIAGNOSIS — Z0000 Encounter for general adult medical examination without abnormal findings: Secondary | ICD-10-CM

## 2021-10-12 NOTE — Patient Instructions (Signed)
Amanda Mathews , Thank you for taking time to come for your Medicare Wellness Visit. I appreciate your ongoing commitment to your health goals. Please review the following plan we discussed and let me know if I can assist you in the future.   Screening recommendations/referrals: Colonoscopy: Done 12/17/19 repeat every 10 years  Mammogram: Done 08/07/21 repeat every  year Bone Density: Done 08/24/21 repeat every 2 years  Recommended yearly ophthalmology/optometry visit for glaucoma screening and checkup Recommended yearly dental visit for hygiene and checkup  Vaccinations: Influenza vaccine: Done 08/30/21 repeat every year  Pneumococcal vaccine: Up to date Tdap vaccine: Done 09/12/14 repeat every 10 years  Shingles vaccine: Shingrix discussed. Please contact your pharmacy for coverage information.    Covid-19:Completed 2/9, 3/9, 09/04/20 & 02/13/21  Advanced directives: Copies in chart  Conditions/risks identified: continue with exercise classes   Next appointment: Follow up in one year for your annual wellness visit    Preventive Care 65 Years and Older, Female Preventive care refers to lifestyle choices and visits with your health care provider that can promote health and wellness. What does preventive care include? A yearly physical exam. This is also called an annual well check. Dental exams once or twice a year. Routine eye exams. Ask your health care provider how often you should have your eyes checked. Personal lifestyle choices, including: Daily care of your teeth and gums. Regular physical activity. Eating a healthy diet. Avoiding tobacco and drug use. Limiting alcohol use. Practicing safe sex. Taking low-dose aspirin every day. Taking vitamin and mineral supplements as recommended by your health care provider. What happens during an annual well check? The services and screenings done by your health care provider during your annual well check will depend on your age, overall  health, lifestyle risk factors, and family history of disease. Counseling  Your health care provider may ask you questions about your: Alcohol use. Tobacco use. Drug use. Emotional well-being. Home and relationship well-being. Sexual activity. Eating habits. History of falls. Memory and ability to understand (cognition). Work and work Astronomer. Reproductive health. Screening  You may have the following tests or measurements: Height, weight, and BMI. Blood pressure. Lipid and cholesterol levels. These may be checked every 5 years, or more frequently if you are over 32 years old. Skin check. Lung cancer screening. You may have this screening every year starting at age 75 if you have a 30-pack-year history of smoking and currently smoke or have quit within the past 15 years. Fecal occult blood test (FOBT) of the stool. You may have this test every year starting at age 71. Flexible sigmoidoscopy or colonoscopy. You may have a sigmoidoscopy every 5 years or a colonoscopy every 10 years starting at age 5. Hepatitis C blood test. Hepatitis B blood test. Sexually transmitted disease (STD) testing. Diabetes screening. This is done by checking your blood sugar (glucose) after you have not eaten for a while (fasting). You may have this done every 1-3 years. Bone density scan. This is done to screen for osteoporosis. You may have this done starting at age 19. Mammogram. This may be done every 1-2 years. Talk to your health care provider about how often you should have regular mammograms. Talk with your health care provider about your test results, treatment options, and if necessary, the need for more tests. Vaccines  Your health care provider may recommend certain vaccines, such as: Influenza vaccine. This is recommended every year. Tetanus, diphtheria, and acellular pertussis (Tdap, Td) vaccine. You may need  a Td booster every 10 years. Zoster vaccine. You may need this after age  60. Pneumococcal 13-valent conjugate (PCV13) vaccine. One dose is recommended after age 72. Pneumococcal polysaccharide (PPSV23) vaccine. One dose is recommended after age 78. Talk to your health care provider about which screenings and vaccines you need and how often you need them. This information is not intended to replace advice given to you by your health care provider. Make sure you discuss any questions you have with your health care provider. Document Released: 12/29/2015 Document Revised: 08/21/2016 Document Reviewed: 10/03/2015 Elsevier Interactive Patient Education  2017 [place] Prevention in the Home Falls can cause injuries. They can happen to people of all ages. There are many things you can do to make your home safe and to help prevent falls. What can I do on the outside of my home? Regularly fix the edges of walkways and driveways and fix any cracks. Remove anything that might make you trip as you walk through a door, such as a raised step or threshold. Trim any bushes or trees on the path to your home. Use bright outdoor lighting. Clear any walking paths of anything that might make someone trip, such as rocks or tools. Regularly check to see if handrails are loose or broken. Make sure that both sides of any steps have handrails. Any raised decks and porches should have guardrails on the edges. Have any leaves, snow, or ice cleared regularly. Use sand or salt on walking paths during winter. Clean up any spills in your garage right away. This includes oil or grease spills. What can I do in the bathroom? Use night lights. Install grab bars by the toilet and in the tub and shower. Do not use towel bars as grab bars. Use non-skid mats or decals in the tub or shower. If you need to sit down in the shower, use a plastic, non-slip stool. Keep the floor dry. Clean up any water that spills on the floor as soon as it happens. Remove soap buildup in the tub or shower  regularly. Attach bath mats securely with double-sided non-slip rug tape. Do not have throw rugs and other things on the floor that can make you trip. What can I do in the bedroom? Use night lights. Make sure that you have a light by your bed that is easy to reach. Do not use any sheets or blankets that are too big for your bed. They should not hang down onto the floor. Have a firm chair that has side arms. You can use this for support while you get dressed. Do not have throw rugs and other things on the floor that can make you trip. What can I do in the kitchen? Clean up any spills right away. Avoid walking on wet floors. Keep items that you use a lot in easy-to-reach places. If you need to reach something above you, use a strong step stool that has a grab bar. Keep electrical cords out of the way. Do not use floor polish or wax that makes floors slippery. If you must use wax, use non-skid floor wax. Do not have throw rugs and other things on the floor that can make you trip. What can I do with my stairs? Do not leave any items on the stairs. Make sure that there are handrails on both sides of the stairs and use them. Fix handrails that are broken or loose. Make sure that handrails are as long as the stairways. Check  any carpeting to make sure that it is firmly attached to the stairs. Fix any carpet that is loose or worn. Avoid having throw rugs at the top or bottom of the stairs. If you do have throw rugs, attach them to the floor with carpet tape. Make sure that you have a light switch at the top of the stairs and the bottom of the stairs. If you do not have them, ask someone to add them for you. What else can I do to help prevent falls? Wear shoes that: Do not have high heels. Have rubber bottoms. Are comfortable and fit you well. Are closed at the toe. Do not wear sandals. If you use a stepladder: Make sure that it is fully opened. Do not climb a closed stepladder. Make sure that  both sides of the stepladder are locked into place. Ask someone to hold it for you, if possible. Clearly mark and make sure that you can see: Any grab bars or handrails. First and last steps. Where the edge of each step is. Use tools that help you move around (mobility aids) if they are needed. These include: Canes. Walkers. Scooters. Crutches. Turn on the lights when you go into a dark area. Replace any light bulbs as soon as they burn out. Set up your furniture so you have a clear path. Avoid moving your furniture around. If any of your floors are uneven, fix them. If there are any pets around you, be aware of where they are. Review your medicines with your doctor. Some medicines can make you feel dizzy. This can increase your chance of falling. Ask your doctor what other things that you can do to help prevent falls. This information is not intended to replace advice given to you by your health care provider. Make sure you discuss any questions you have with your health care provider. Document Released: 09/28/2009 Document Revised: 05/09/2016 Document Reviewed: 01/06/2015 Elsevier Interactive Patient Education  2017 [employer].

## 2021-10-12 NOTE — Progress Notes (Addendum)
Virtual Visit via Telephone Note  I connected with  Amanda Mathews on 10/12/21 at  8:00 AM EDT by telephone and verified that I am speaking with the correct person using two identifiers.  Medicare Annual Wellness visit completed telephonically due to Covid-19 pandemic.   Persons participating in this call: This Health Coach and this patient.   Location: Patient: Home Provider: Office   I discussed the limitations, risks, security and privacy concerns of performing an evaluation and management service by telephone and the availability of in person appointments. The patient expressed understanding and agreed to proceed.  Unable to perform video visit due to video visit attempted and failed and/or patient does not have video capability.   Some vital signs may be absent or patient reported.   Marzella Schlein, LPN   Subjective:   Amanda Mathews is a 75 y.o. female who presents for Medicare Annual (Subsequent) preventive examination.  Review of Systems     Cardiac Risk Factors include: advanced age (>43men, >15 women);obesity (BMI >30kg/m2);hypertension     Objective:    There were no vitals filed for this visit. There is no height or weight on file to calculate BMI.  Advanced Directives 10/12/2021 10/19/2020 10/06/2020  Does Patient Have a Medical Advance Directive? Yes Yes Yes  Type of Advance Directive Healthcare Power of Attorney Living will;Healthcare Power of [employer] Power of [place];Living will  Does patient want to make changes to medical advance directive? - No - Patient declined -  Copy of Healthcare Power of Attorney in Chart? Yes - validated most recent copy scanned in chart (See row information) No - copy requested No - copy requested    Current Medications (verified) Outpatient Encounter Medications as of 10/12/2021  Medication Sig   buPROPion (WELLBUTRIN XL) 300 MG 24 hr tablet Take 1 tablet (300 mg total) by mouth daily.   Calcium  Carb-Cholecalciferol (CALCIUM 600-D PO) Take 600 mg by mouth daily.   carbamazepine (TEGRETOL) 200 MG tablet Take 200 mg by mouth 2 (two) times daily.   Cholecalciferol (VITAMIN D3) 125 MCG (5000 UT) CAPS Take 5,000 Units by mouth daily.   denosumab (PROLIA) 60 MG/ML SOSY injection Inject 60 mg into the skin every 6 (six) months.   gabapentin (NEURONTIN) 300 MG capsule TAKE 1 CAPSULE BY MOUTH THREE TIMES A DAY (Patient taking differently: Twice a day)   hydrochlorothiazide (HYDRODIURIL) 25 MG tablet Take 25 mg by mouth daily.   levothyroxine (SYNTHROID) 150 MCG tablet Take 1 tablet (150 mcg total) by mouth daily before breakfast.   MAGNESIUM PO Take 1 tablet by mouth 2 (two) times daily.   meloxicam (MOBIC) 7.5 MG tablet TAKE 1 TABLET BY MOUTH EVERY DAY   methocarbamol (ROBAXIN) 750 MG tablet TAKE 1 TABLET (750 MG TOTAL) BY MOUTH 2 (TWO) TIMES DAILY AS NEEDED FOR MUSCLE SPASMS   nystatin cream (MYCOSTATIN) nystatin 100,000 unit/gram topical cream   potassium chloride SA (KLOR-CON) 20 MEQ tablet Take 20 mEq by mouth daily.   simvastatin (ZOCOR) 20 MG tablet Take 1 tablet (20 mg total) by mouth at bedtime.   albuterol (VENTOLIN HFA) 108 (90 Base) MCG/ACT inhaler Inhale 2 puffs into the lungs every 6 (six) hours as needed for wheezing or shortness of breath. (Patient not taking: Reported on 10/12/2021)   NON FORMULARY Bi-pap machine (Patient not taking: Reported on 10/12/2021)   No facility-administered encounter medications on file as of 10/12/2021.    Allergies (verified) Codeine, Hydromorphone, Morphine, and Ropinirole  History: Past Medical History:  Diagnosis Date   Cataract    Osteoporosis    Sleep apnea    Thyroid disease    Past Surgical History:  Procedure Laterality Date   back stimulator      BUNIONECTOMY     CHOLECYSTECTOMY     FEMUR FRACTURE SURGERY     REPLACEMENT TOTAL KNEE BILATERAL     Family History  Problem Relation Age of Onset   Hypertension Mother     Stroke Mother    Arthritis Sister    COPD Sister    Depression Sister    Diabetes Sister    Alcohol abuse Brother    Hypertension Sister    Social History   Socioeconomic History   Marital status: Divorced    Spouse name: Not on file   Number of children: Not on file   Years of education: Not on file   Highest education level: Not on file  Occupational History   Occupation: Retired   Tobacco Use   Smoking status: Never   Smokeless tobacco: Never  Substance and Sexual Activity   Alcohol use: Never   Drug use: Never   Sexual activity: Not Currently    Birth control/protection: Abstinence  Other Topics Concern   Not on file  Social History Narrative   Not on file   Social Determinants of Health   Financial Resource Strain: Low Risk    Difficulty of Paying Living Expenses: Not hard at all  Food Insecurity: No Food Insecurity   Worried About Programme researcher, broadcasting/film/video in the Last Year: Never true   Ran Out of Food in the Last Year: Never true  Transportation Needs: No Transportation Needs   Lack of Transportation (Medical): No   Lack of Transportation (Non-Medical): No  Physical Activity: Sufficiently Active   Days of Exercise per Week: 5 days   Minutes of Exercise per Session: 60 min  Stress: No Stress Concern Present   Feeling of Stress : Not at all  Social Connections: Moderately Integrated   Frequency of Communication with Friends and Family: More than three times a week   Frequency of Social Gatherings with Friends and Family: More than three times a week   Attends Religious Services: More than 4 times per year   Active Member of [employer] or Organizations: Yes   Attends Banker Meetings: 1 to 4 times per year   Marital Status: Divorced    Tobacco Counseling Counseling given: Not Answered   Clinical Intake:  Pre-visit preparation completed: Yes  Pain : No/denies pain     BMI - recorded: 39.57 Nutritional Status: BMI > 30  Obese Nutritional Risks:  None Diabetes: No  How often do you need to have someone help you when you read instructions, pamphlets, or other written materials from your doctor or pharmacy?: 1 - Never  Diabetic?No  Interpreter Needed?: No  Information entered by :: Lanier Ensign, LPN   Activities of Daily Living In your present state of health, do you have any difficulty performing the following activities: 10/12/2021  Hearing? N  Vision? N  Difficulty concentrating or making decisions? Y  Comment memory at times  Walking or climbing stairs? N  Dressing or bathing? N  Doing errands, shopping? N  Preparing Food and eating ? N  Using the Toilet? N  In the past six months, have you accidently leaked urine? N  Do you have problems with loss of bowel control? N  Managing your Medications? N  Managing your Finances? N  Housekeeping or managing your Housekeeping? N  Some recent data might be hidden    Patient Care Team: Ardith Dark, MD as PCP - General (Family Medicine)  Indicate any recent Medical Services you may have received from other than [facility] providers in the past year (date may be approximate).     Assessment:   This is a routine wellness examination for Amanda Mathews.  Hearing/Vision screen Hearing Screening - Comments:: Pt denies any hearing issues  Vision Screening - Comments:: Pt follows up with Dr Jovita Kussmaul for annual eye exams   Dietary issues and exercise activities discussed: Current Exercise Habits: Home exercise routine;Structured exercise class, Type of exercise: walking, Time (Minutes): > 60, Frequency (Times/Week): 5, Weekly Exercise (Minutes/Week): 0   Goals Addressed             This Visit's Progress    Patient Stated       Continue going to the gym       Depression Screen PHQ 2/9 Scores 10/12/2021 09/17/2021 01/23/2021 10/06/2020 07/17/2020  PHQ - 2 Score 0 0 0 0 0  PHQ- 9 Score - 2 3 - -    Fall Risk Fall Risk  10/12/2021 10/06/2020  Falls in the past year? 0 0  Number  falls in past yr: 0 0  Injury with Fall? 0 0  Risk for fall due to : Impaired mobility;Impaired balance/gait;Impaired vision Impaired vision;Impaired mobility;Impaired balance/gait  Follow up Falls prevention discussed Falls prevention discussed    FALL RISK PREVENTION PERTAINING TO THE HOME:  Any stairs in or around the home? Yes  If so, are there any without handrails? No  Home free of loose throw rugs in walkways, pet beds, electrical cords, etc? Yes  Adequate lighting in your home to reduce risk of falls? Yes   ASSISTIVE DEVICES UTILIZED TO PREVENT FALLS:  Life alert? No  Use of a cane, walker or w/c? Yes  Grab bars in the bathroom? Yes  Shower chair or bench in shower? Yes  Elevated toilet seat or a handicapped toilet? No   TIMED UP AND GO:  Was the test performed? No .   Cognitive Function:     6CIT Screen 10/12/2021 10/06/2020  What Year? 0 points 0 points  What month? 0 points 0 points  What time? 0 points -  Count back from 20 0 points 0 points  Months in reverse 0 points 0 points  Repeat phrase 0 points 0 points  Total Score 0 -    Immunizations Immunization History  Administered Date(s) Administered   Fluad Quad(high Dose 65+) 08/30/2021   Influenza, High Dose Seasonal PF 10/26/2019   Influenza,inj,quad, With Preservative 05/16/2017, 09/15/2018, 08/17/2019   Influenza-Unspecified 10/15/2012, 09/06/2013, 09/12/2014, 09/26/2015, 10/03/2016, 09/24/2017, 09/25/2018   Moderna Sars-Covid-2 Vaccination 01/25/2020, 02/22/2020, 09/04/2020   PPD Test 09/12/2014   Pneumococcal Conjugate-13 01/23/2021   Pneumococcal-Unspecified 07/07/2013, 11/21/2014, 09/16/2015   Tdap 09/12/2014   Unspecified SARS-COV-2 Vaccination 01/25/2020, 02/13/2021   Zoster, Live 12/14/2013, 09/16/2017, 04/01/2018    TDAP status: Up to date  Flu Vaccine status: Up to date  Pneumococcal vaccine status: Up to date  Covid-19 vaccine status: Completed vaccines  Qualifies for Shingles  Vaccine? Yes   Zostavax completed Yes   Shingrix Completed?: No.    Education has been provided regarding the importance of this vaccine. Patient has been advised to call insurance company to determine out of pocket expense if they have not yet received this vaccine. Advised may also receive  vaccine at local pharmacy or Health Dept. Verbalized acceptance and understanding.  Screening Tests Health Maintenance  Topic Date Due   Hepatitis C Screening  Never done   Zoster Vaccines- Shingrix (1 of 2) Never done   COVID-19 Vaccine (5 - Booster for Moderna series) 04/10/2021   MAMMOGRAM  12/27/2021   Pneumonia Vaccine 21+ Years old (2 - PPSV23 if available, else PCV20) 01/23/2022   TETANUS/TDAP  09/12/2024   COLONOSCOPY (Pts 45-68yrs Insurance coverage will need to be confirmed)  01/06/2030   INFLUENZA VACCINE  Completed   DEXA SCAN  Completed   HPV VACCINES  Aged Out    Health Maintenance  Health Maintenance Due  Topic Date Due   Hepatitis C Screening  Never done   Zoster Vaccines- Shingrix (1 of 2) Never done   COVID-19 Vaccine (5 - Booster for Moderna series) 04/10/2021    Colorectal cancer screening: Type of screening: Colonoscopy. Completed 12/17/19. Repeat every 10 years  Mammogram status: Completed 08/07/21. Repeat every year  Bone Density status: Completed 08/24/21. Results reflect: Bone density results: OSTEOPOROSIS. Repeat every 2 years.  Additional Screening:  Hepatitis C Screening: does qualify;   Vision Screening: Recommended annual ophthalmology exams for early detection of glaucoma and other disorders of the eye. Is the patient up to date with their annual eye exam?  Yes  Who is the provider or what is the name of the office in which the patient attends annual eye exams? Dr Jovita Kussmaul  If pt is not established with a provider, would they like to be referred to a provider to establish care? No .   Dental Screening: Recommended annual dental exams for proper oral  hygiene  Community Resource Referral / Chronic Care Management: CRR required this visit?  No   CCM required this visit?  No      Plan:     I have personally reviewed and noted the following in the patient's chart:   Medical and social history Use of alcohol, tobacco or illicit drugs  Current medications and supplements including opioid prescriptions.  Functional ability and status Nutritional status Physical activity Advanced directives List of other physicians Hospitalizations, surgeries, and ER visits in previous 12 months Vitals Screenings to include cognitive, depression, and falls Referrals and appointments  In addition, I have reviewed and discussed with patient certain preventive protocols, quality metrics, and best practice recommendations. A written personalized care plan for preventive services as well as general preventive health recommendations were provided to patient.     Marzella Schlein, LPN   [id number]   Nurse Notes: None

## 2021-10-17 ENCOUNTER — Other Ambulatory Visit: Payer: Self-pay | Admitting: Family Medicine

## 2021-11-07 ENCOUNTER — Other Ambulatory Visit: Payer: Self-pay

## 2021-11-07 ENCOUNTER — Other Ambulatory Visit: Payer: Self-pay | Admitting: Family Medicine

## 2021-11-07 NOTE — Telephone Encounter (Signed)
LAST APPOINTMENT DATE:  09/17/21  NEXT APPOINTMENT DATE: 03/18/22  MEDICATION:hydrochlorothiazide (HYDRODIURIL) 25 MG tablet  PHARMACY: [id number] - [place], [place] - [address] AT [facility]

## 2021-11-12 ENCOUNTER — Other Ambulatory Visit: Payer: Self-pay | Admitting: Family Medicine

## 2021-11-12 MED ORDER — HYDROCHLOROTHIAZIDE 25 MG PO TABS: 25.0000 mg | ORAL_TABLET | Freq: Every day | ORAL | 3 refills | Status: DC

## 2021-11-21 DIAGNOSIS — L304 Erythema intertrigo: Secondary | ICD-10-CM | POA: Diagnosis not present

## 2021-11-21 DIAGNOSIS — L853 Xerosis cutis: Secondary | ICD-10-CM | POA: Diagnosis not present

## 2021-11-21 DIAGNOSIS — Z85828 Personal history of other malignant neoplasm of skin: Secondary | ICD-10-CM | POA: Diagnosis not present

## 2021-11-21 DIAGNOSIS — L570 Actinic keratosis: Secondary | ICD-10-CM | POA: Diagnosis not present

## 2021-11-21 DIAGNOSIS — D225 Melanocytic nevi of trunk: Secondary | ICD-10-CM | POA: Diagnosis not present

## 2021-11-21 DIAGNOSIS — L308 Other specified dermatitis: Secondary | ICD-10-CM | POA: Diagnosis not present

## 2021-11-21 DIAGNOSIS — Z08 Encounter for follow-up examination after completed treatment for malignant neoplasm: Secondary | ICD-10-CM | POA: Diagnosis not present

## 2021-11-23 ENCOUNTER — Other Ambulatory Visit: Payer: Self-pay | Admitting: Family Medicine

## 2021-11-29 ENCOUNTER — Other Ambulatory Visit: Payer: Self-pay

## 2021-11-29 ENCOUNTER — Ambulatory Visit: Payer: Medicare PPO | Admitting: Family Medicine

## 2021-11-29 ENCOUNTER — Encounter: Payer: Self-pay | Admitting: Family Medicine

## 2021-11-29 VITALS — BP 136/81 | HR 81 | Temp 97.3°F | Ht 60.0 in | Wt 188.6 lb

## 2021-11-29 DIAGNOSIS — S61216S Laceration without foreign body of right little finger without damage to nail, sequela: Secondary | ICD-10-CM

## 2021-11-29 DIAGNOSIS — G8929 Other chronic pain: Secondary | ICD-10-CM | POA: Diagnosis not present

## 2021-11-29 DIAGNOSIS — I10 Essential (primary) hypertension: Secondary | ICD-10-CM

## 2021-11-29 DIAGNOSIS — R350 Frequency of micturition: Secondary | ICD-10-CM | POA: Diagnosis not present

## 2021-11-29 DIAGNOSIS — W19XXXD Unspecified fall, subsequent encounter: Secondary | ICD-10-CM

## 2021-11-29 DIAGNOSIS — M545 Low back pain, unspecified: Secondary | ICD-10-CM

## 2021-11-29 NOTE — Assessment & Plan Note (Signed)
No red flags.  She follows with pain management.  She is concerned that she may have damaged her spinal stimulator with her recent falls.  Seems to be working normally and did not have any obvious trauma to the area though we will check x-ray to further evaluate for patient request.

## 2021-11-29 NOTE — Progress Notes (Signed)
Amanda Mathews is a 75 y.o. female who presents today for an office visit.  Assessment/Plan:  New/Acute Problems: Falls All seem to be mechanical in nature.  Had labs in a couple months ago which were normal.  Thankfully seems to be mostly bruising at this point.  She is able to ambulate without pain-do not think she has any fracture at this point.  Given her recent falls will place referral to physical therapy for evaluation and management.  No concern for cardiac or neurogenic etiology.  She does have a spinal cord stimulator in place however is neurovascular intact distally.  Urinary frequency We will check urine culture.  Chronic Problems Addressed Today: Chronic low back pain No red flags.  She follows with pain management.  She is concerned that she may have damaged her spinal stimulator with her recent falls.  Seems to be working normally and did not have any obvious trauma to the area though we will check x-ray to further evaluate for patient request.  Essential hypertension At goal on HCTZ 25 mg daily.    Subjective:  HPI:  She complain of falling 3 times over the past week or two.  First time states that she lost balance when getting out of a car and fell onto her right side.  Had some pain but was able to walk afterwards.  Few days later she slipped  on her deck. She thinks her shoes was slippery and loosen when she fell the second time. She fell onto her chin and has some bruise on the chin.  For her third fall she tripped over asphalt or gravel and landed on her right side again.  She also have bruise in her  finger, arm and hip after the third fall. Had some redness in finger. She tried rubbing alcohol and it seems to be getting better. She have been able to walk since the fall without any difficulty. She does have some soreness in hip. She thinks pain is getting better. Also had increased pain in leg since falling. She reports her legs feel numb. This comes and goes. This  got worse since fall.  She is also having issue with urinary frequency. She has to used bathroom every 30 minutes. Denies hematuria, dysuria or urinary incontinence       Objective:  Physical Exam: BP 136/81 (BP Location: Left Arm)    Pulse 81    Temp (!) 97.3 F (36.3 C) (Temporal)    Ht 5' (1.524 m)    Wt 188 lb 9.6 oz (85.5 kg)    SpO2 96%    BMI 36.83 kg/m   Gen: No acute distress, resting comfortably CV: Regular rate and rhythm with no murmurs appreciated Pulm: Normal work of breathing, clear to auscultation bilaterally with no crackles, wheezes, or rhonchi MSK: Ecchymosis noted to right posterior greater trochanter.  Nontender to palpation.  Also with some bruising to her right knee and chin.  Small laceration noted on right fifth digit.  Appears to be well-healing with no signs of infection. Neuro: Cranial nerves intact.  Gait observed with walker without significant abnormality.  Strength 5 out of 5 in lower extremities.  Sensation to light touch intact throughout. Psych: Normal affect and thought content       I,Savera Zaman,acting as a scribe for Jacquiline Doe, MD.,have documented all relevant documentation on the behalf of Jacquiline Doe, MD,as directed by  Jacquiline Doe, MD while in the presence of Jacquiline Doe, MD.   I,  Dimas Chyle, MD, have reviewed all documentation for this visit. The documentation on 11/29/21 for the exam, diagnosis, procedures, and orders are all accurate and complete.  Algis Greenhouse. Jerline Pain, MD 11/29/2021 11:24 AM

## 2021-11-29 NOTE — Patient Instructions (Signed)
It was very nice to see you today!  We will check a urine culture to make sure that she did not have a UTI.  Please go to any pain to get your x-ray.  I would like for you to see the physical therapist.  They will call you to schedule an appointment soon.  Take care, Dr [name]  PLEASE NOTE:  If you had any lab tests please let us know if you have not heard back within a few days. You may see your results on mychart before we have a chance to review them but we will give you a call once they are reviewed by [place]. If we ordered any referrals today, please let us know if you have not heard from their office within the next week.   Please try these tips to maintain a healthy lifestyle:  Eat at least 3 REAL meals and 1-2 snacks per day.  Aim for no more than 5 hours between eating.  If you eat breakfast, please do so within one hour of getting up.   Each meal should contain half fruits/vegetables, one quarter protein, and one quarter carbs (no bigger than a computer mouse)  Cut down on sweet beverages. This includes juice, soda, and sweet tea.   Drink at least 1 glass of water with each meal and aim for at least 8 glasses per day  Exercise at least 150 minutes every week.

## 2021-11-29 NOTE — Assessment & Plan Note (Signed)
At goal on HCTZ 25 mg daily. ?

## 2021-11-30 ENCOUNTER — Ambulatory Visit (HOSPITAL_COMMUNITY)
Admission: RE | Admit: 2021-11-30 | Discharge: 2021-11-30 | Disposition: A | Discharge status: Home / Self Care | Payer: Medicare PPO | Source: Ambulatory Visit | Attending: Family Medicine | Admitting: Family Medicine

## 2021-11-30 DIAGNOSIS — W19XXXD Unspecified fall, subsequent encounter: Secondary | ICD-10-CM | POA: Diagnosis not present

## 2021-11-30 DIAGNOSIS — M2578 Osteophyte, vertebrae: Secondary | ICD-10-CM | POA: Diagnosis not present

## 2021-11-30 DIAGNOSIS — M5136 Other intervertebral disc degeneration, lumbar region: Secondary | ICD-10-CM | POA: Insufficient documentation

## 2021-11-30 DIAGNOSIS — Z9682 Presence of neurostimulator: Secondary | ICD-10-CM | POA: Diagnosis not present

## 2021-11-30 LAB — URINE CULTURE
MICRO NUMBER:: 12761925
SPECIMEN QUALITY:: ADEQUATE

## 2021-11-30 IMAGING — DX DG LUMBAR SPINE COMPLETE 4+V
5 series · 5 of 5 positions shown · non-contrast
Comparison: [DATE]

CLINICAL DATA: Status post fall.

EXAM:
LUMBAR SPINE - COMPLETE 4+ VIEW

[l-spine ap]
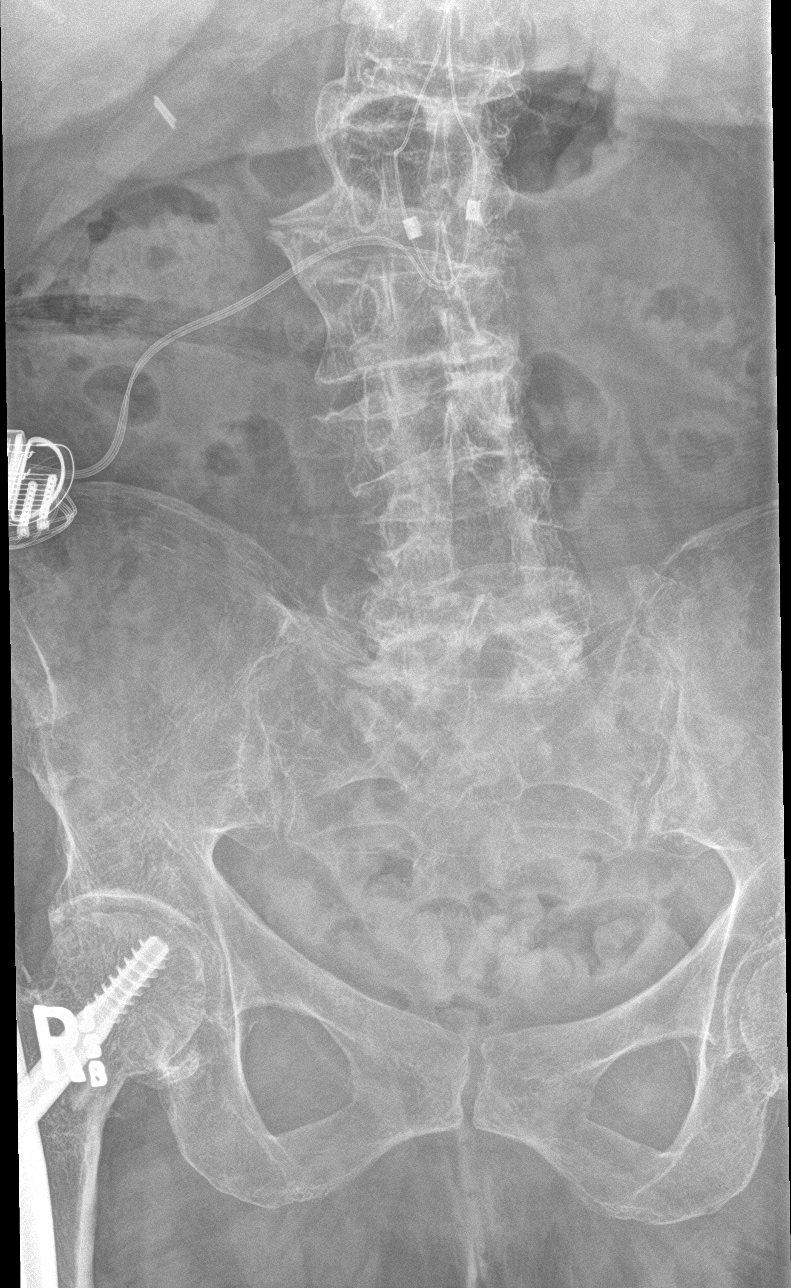

[l-spine obl (1 of 2)]
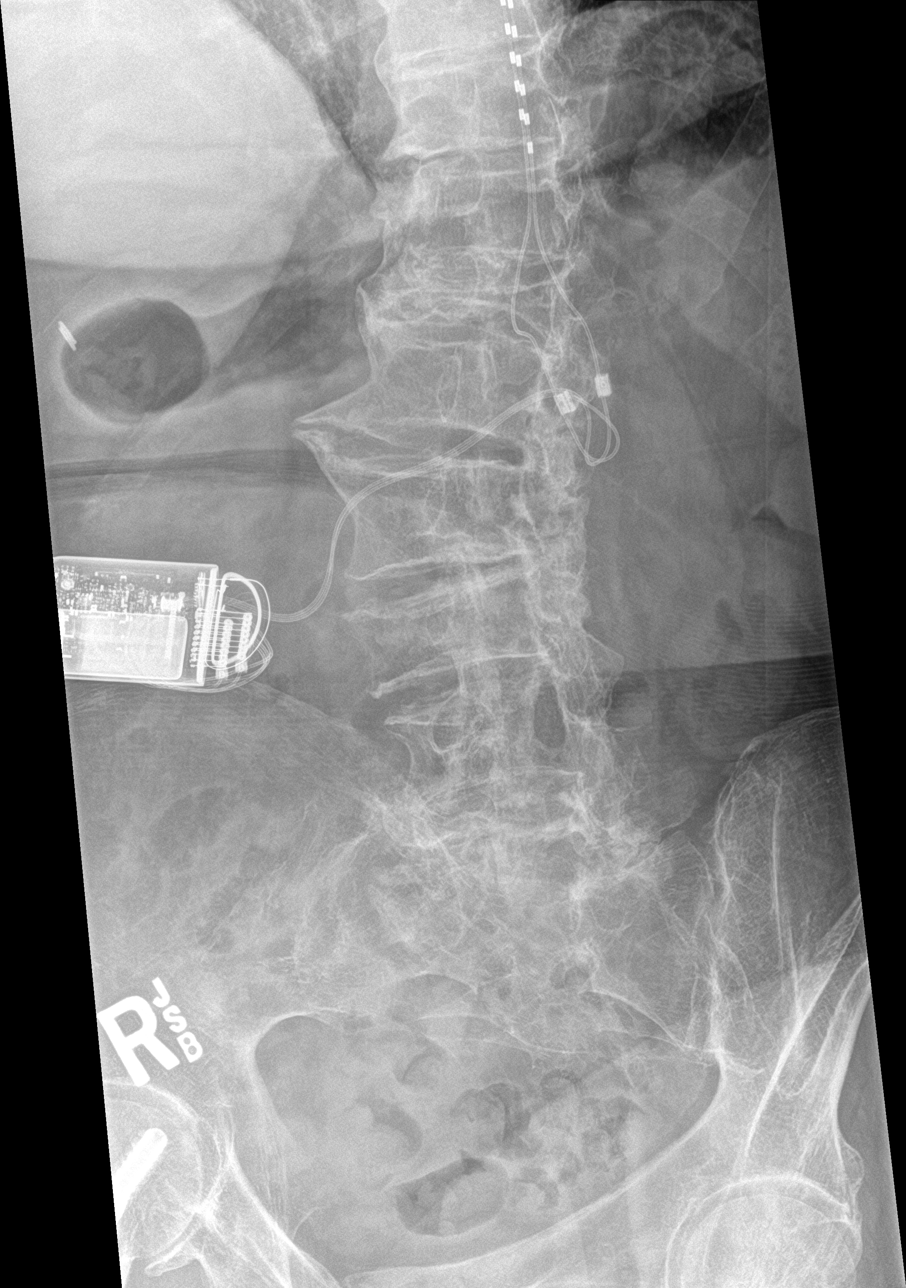

[l-spine obl (2 of 2)]
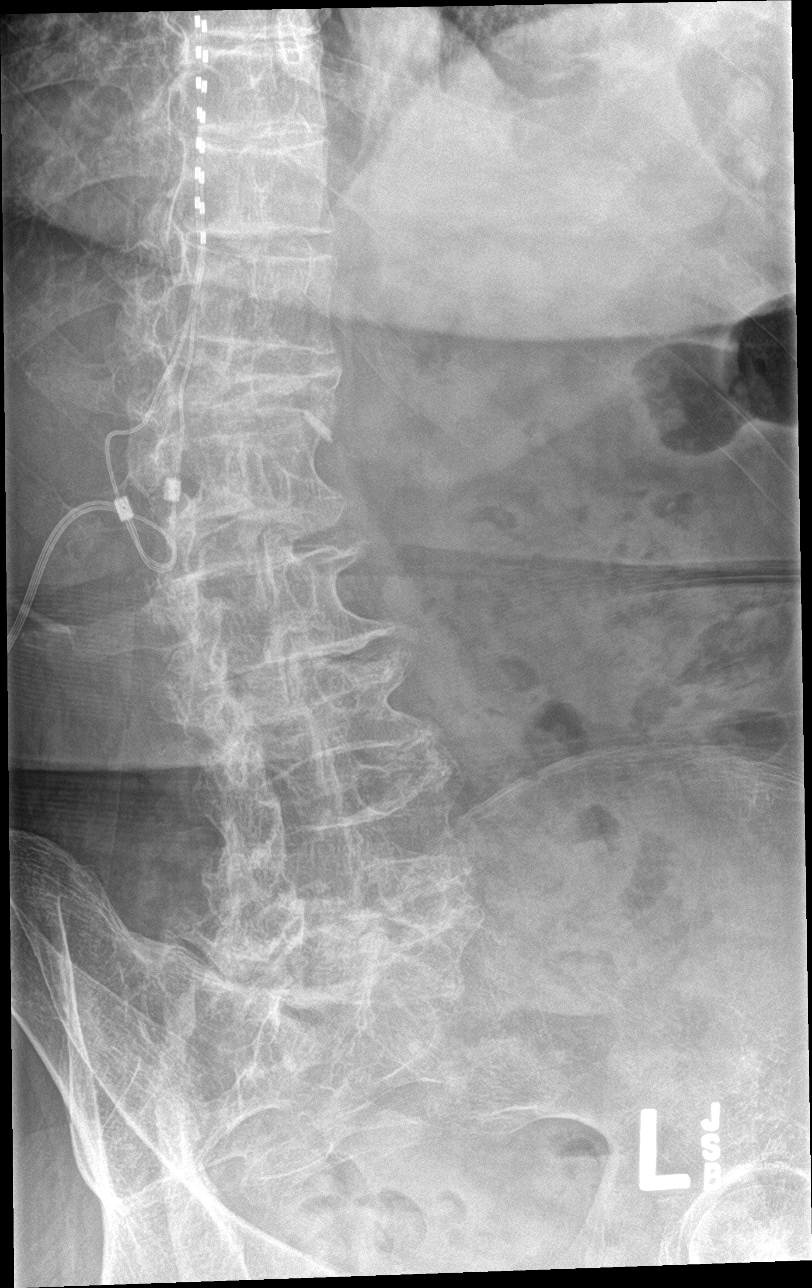

[l-spine lat]
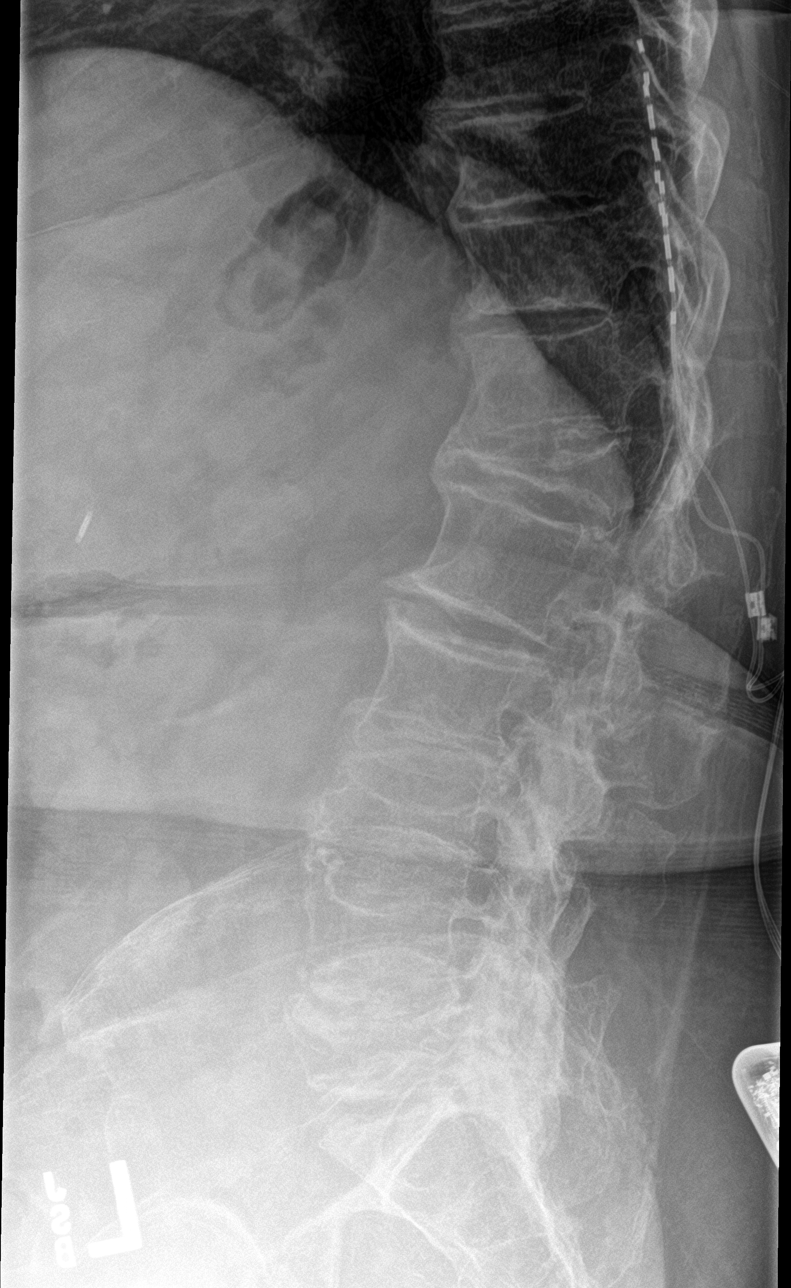

[l-spine spot]
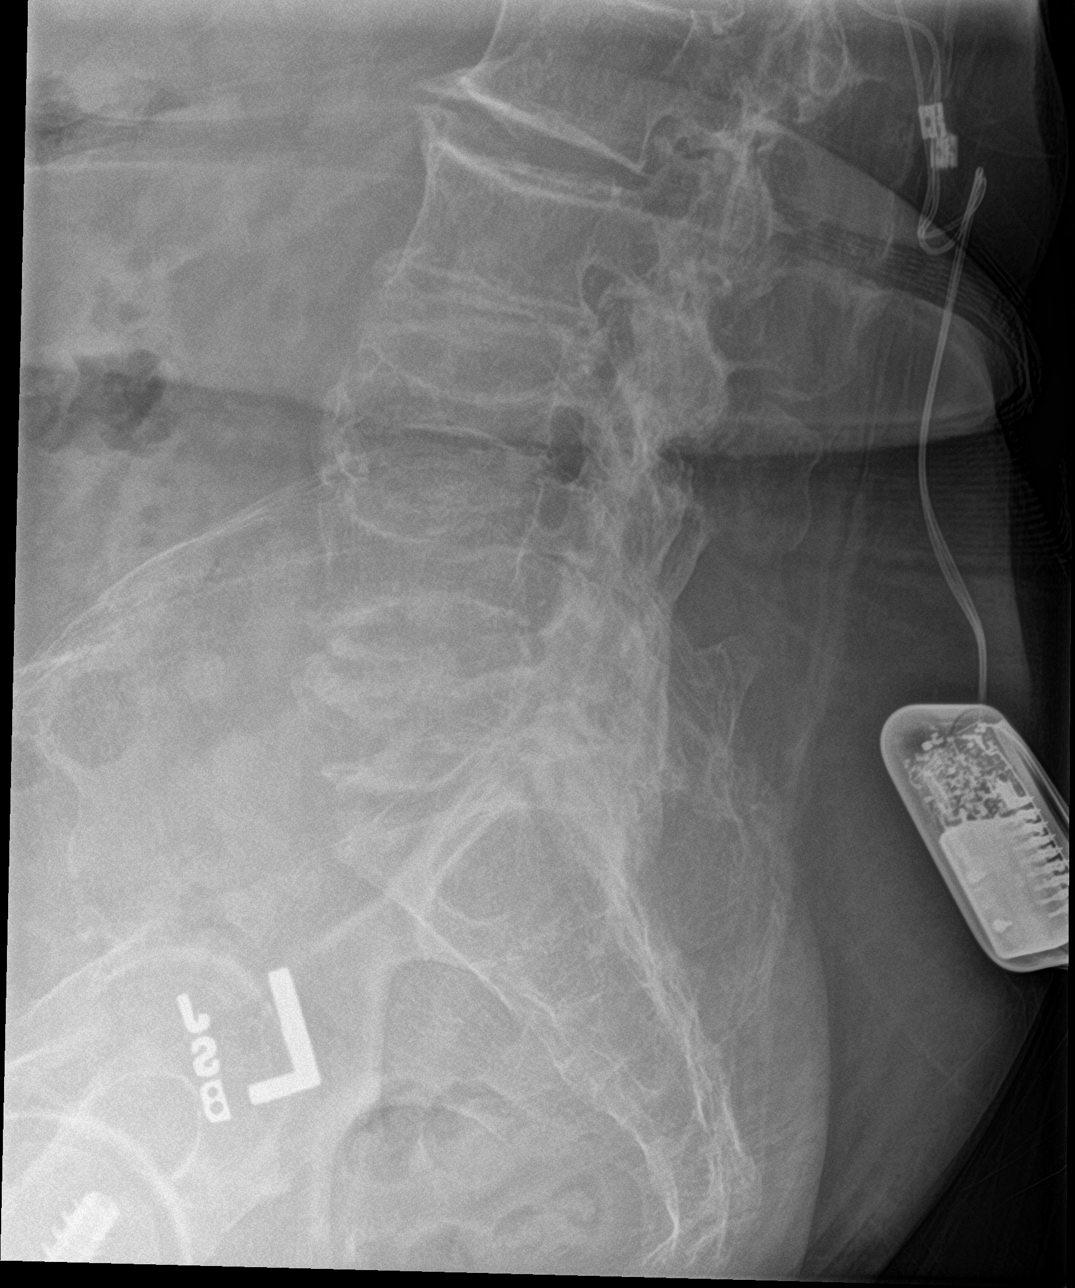

[5 of 5 positions shown; findings below may reference images not displayed]

FINDINGS: Chronic appearing compression fracture deformities are seen
involving the levels of T12, L3, L4 and L5 vertebral bodies. There
is stable mild to moderate severity dextroscoliosis. Stable spinal
stimulator wire positioning is noted. Marked severity multilevel
endplate sclerosis and osteophyte formation are seen throughout all
levels of the lumbar spine. Moderate severity multilevel
intervertebral disc space narrowing is also present.
IMPRESSION: 1. Chronic appearing compression fracture deformities involving the
T12, L3, L4 and L5 vertebral bodies.
2. Advanced multilevel degenerative disc disease.

## 2021-12-03 ENCOUNTER — Telehealth: Payer: Self-pay | Admitting: Family Medicine

## 2021-12-03 NOTE — Progress Notes (Signed)
Please inform patient of the following:  Urine culture is negative. She does not need any antibiotics.  Katina Degree. [name], MD 12/03/2021 8:24 AM

## 2021-12-03 NOTE — Telephone Encounter (Signed)
Pt returned call to review labs. Please advise.

## 2021-12-03 NOTE — Telephone Encounter (Signed)
Please see lab note.

## 2021-12-03 NOTE — Progress Notes (Signed)
Please inform patient of the following:  Her x-ray showed stable but chronic degenerative changes.  Her stimulator position is stable.  Do not need to do any further testing at this point.  Would like for her to follow-up with PT as we discussed.

## 2021-12-10 ENCOUNTER — Other Ambulatory Visit: Payer: Self-pay | Admitting: Family Medicine

## 2021-12-21 ENCOUNTER — Other Ambulatory Visit: Payer: Self-pay | Admitting: Family Medicine

## 2021-12-25 ENCOUNTER — Other Ambulatory Visit: Payer: Self-pay

## 2021-12-25 ENCOUNTER — Ambulatory Visit (HOSPITAL_COMMUNITY): Payer: Medicare PPO | Attending: Family Medicine | Admitting: Physical Therapy

## 2021-12-25 ENCOUNTER — Encounter (HOSPITAL_COMMUNITY): Payer: Self-pay | Admitting: Physical Therapy

## 2021-12-25 DIAGNOSIS — R2681 Unsteadiness on feet: Secondary | ICD-10-CM | POA: Diagnosis not present

## 2021-12-25 DIAGNOSIS — R296 Repeated falls: Secondary | ICD-10-CM | POA: Diagnosis not present

## 2021-12-25 DIAGNOSIS — M6281 Muscle weakness (generalized): Secondary | ICD-10-CM

## 2021-12-25 NOTE — Therapy (Signed)
[facility] [address] [place], [place], [address] Phone: [phone]   Fax:  [phone]  Physical Therapy Evaluation  Patient Details  Name: Amanda Mathews MRN: [id number] Date of Birth: 03/08/1946 Referring Provider (PT): Jacquiline Doe   Encounter Date: 12/25/2021   PT End of Session - 12/25/21 1047     Visit Number 1    Number of Visits 12    Date for PT Re-Evaluation 02/05/22    Authorization Type [employer]    Authorization - Visit Number 1    Authorization - Number of Visits 12    Progress Note Due on Visit 0.1    PT Start Time 0920    PT Stop Time 1005    PT Time Calculation (min) 45 min    Activity Tolerance Patient tolerated treatment well    Behavior During Therapy [facility] for tasks assessed/performed             Past Medical History:  Diagnosis Date   Cataract    Osteoporosis    Sleep apnea    Thyroid disease     Past Surgical History:  Procedure Laterality Date   back stimulator      BUNIONECTOMY     CHOLECYSTECTOMY     FEMUR FRACTURE SURGERY     REPLACEMENT TOTAL KNEE BILATERAL      There were no vitals filed for this visit.    Subjective Assessment - 12/25/21 0922     Subjective Amanda Mathews states that she has fallen three times in one week.  She has recently lost weight and her shoes were to loose and she thinks that it was due to her shoes tripping her up.  She got a walker due to the recommendation of her MD and changed shoes and she has not fallen since.  She goes to work out at the senior center, when she does this she uses a cane.  She feels like she is wobbly when she walks but feels that it is due to her back.  She had therapy for her back in the past. 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    Pertinent History OA, Chronis low back pain with a stimulator in her back, hx of Rt femur fx    Currently in Pain? No/denies                [facility] PT  Assessment - 12/25/21 0001       Assessment   Medical Diagnosis History of falling, unsteady gait    Referring Provider (PT) Jacquiline Doe    Onset Date/Surgical Date 02/13/21    Next MD Visit not scheduled    Prior Therapy for her back      Precautions   Precautions Fall      Restrictions   Weight Bearing Restrictions No      Balance Screen   Has the patient fallen in the past 6 months Yes    How many times? 3    Has the patient had a decrease in activity level because of a fear of falling?  Yes    Is the patient reluctant to leave their home because of a fear of falling?  No      Home Environment   Living Environment Private residence    Type of Home House    Home Access Stairs to enter    [employer] of Steps 3    Entrance Stairs-Rails  Right    Additional Comments does ot complete in a reciprocal manner      Prior Function   Level of Independence Independent    Vocation Retired    Leisure work out,      Copy Status Within Functional Limits for tasks assessed      Functional Tests   Functional tests Single leg stance;Sit to Stand      Single Leg Stance   Comments Rt: 2"; Lt:-0"      Sit to Stand   Comments 10 in 30 seconds      ROM / Strength   AROM / PROM / Strength AROM;Strength      AROM   AROM Assessment Site Lumbar    Lumbar Extension 8      Strength   Strength Assessment Site Hip;Knee;Ankle    Right/Left Hip Right;Left    Right Hip Flexion 3+/5    Right Hip Extension 2/5    Right Hip ABduction 2+/5    Left Hip Flexion 5/5    Left Hip Extension 2/5   tested sl as pt can not tolerate prone   Left Hip ABduction 3/5    Right/Left Knee Right;Left    Right Knee Extension 4+/5    Left Knee Flexion 5/5   tested sl as pt can not be on their stomach   Left Knee Extension 5/5    Right/Left Ankle Right;Left    Right Ankle Dorsiflexion 5/5    Left Ankle Dorsiflexion 5/5                         Objective measurements completed on examination: See above findings.       [facility] Adult PT Treatment/Exercise - 12/25/21 0001       Exercises   Exercises Knee/Hip      Knee/Hip Exercises: Seated   Other Seated Knee/Hip Exercises abdominal isometric x 10      Knee/Hip Exercises: Supine   Bridges 5 reps;2 sets    Straight Leg Raises Right;5 reps    Other Supine Knee/Hip Exercises hip ab/adductoin x 10                     PT Education - 12/25/21 1046     Education Details HEP    Person(s) Educated Patient    Methods Explanation    Comprehension Verbalized understanding              PT Short Term Goals - 12/25/21 1057       PT SHORT TERM GOAL #1   Title PT to be I in HEP to improve strength to decrease risk of falling    Time 3    Period Weeks    Status New    Target Date 01/15/22      PT SHORT TERM GOAL #2   Title PT to be able to single leg stance for at least 8 seconds B to reduce risk of falling    Time 3    Period Weeks    Status New               PT Long Term Goals - 12/25/21 1058       PT LONG TERM GOAL #1   Title PT to be I in advanced HEP to improve balance to decrease risk of falling    Time 6    Period Weeks    Status New  Target Date 02/05/22      PT LONG TERM GOAL #2   Title PT to be able to single leg stance for 12 seconds B    Time 6    Period Weeks    Status New      PT LONG TERM GOAL #3   Title PT leg strength to increase to at least 4/5 to improve functional abiltiy  to include 12 sit to stands in 30 seconds.    Time 6    Period Weeks    Status New      PT LONG TERM GOAL #4   Title no falls in the past 6 weeks    Time 6    Period Weeks    Status New                    Plan - 12/25/21 1051     Clinical Impression Statement Amanda Mathews is a 76 yo female who has been referred to skilled PT due to multiple falls.  Evaluation demonstrates postural dysfunction, decreased  strength, decreased balance and difficulty walking.  Amanda Mathews will benefit from skilled PT to address these issues and improve her safety and functioning level.    Personal Factors and Comorbidities Age;Comorbidity 2    Comorbidities Lumbar stenosis, cervical myelopathy, and Oa    Examination-Activity Limitations Stand;Lift;Locomotion Level;Reach Overhead    Examination-Participation Restrictions Cleaning;Laundry;Shop    Stability/Clinical Decision Making Evolving/Moderate complexity    Clinical Decision Making Moderate    Rehab Potential Good    PT Frequency 2x / week    PT Duration 6 weeks    PT Treatment/Interventions Patient/family education;Therapeutic activities;Therapeutic exercise;Balance training    PT Next Visit Plan Progress hip strengthening, standing hip abduction, extension, squats, step ups....; and balance    PT Home Exercise Plan standing extension, sitting abdominal set, bridge and supine hip abduction    Consulted and Agree with Plan of Care Patient             Patient will benefit from skilled therapeutic intervention in order to improve the following deficits and impairments:  Abnormal gait, Decreased activity tolerance, Decreased balance, Decreased range of motion, Difficulty walking, Decreased strength  Visit Diagnosis: Repeated falls  Muscle weakness (generalized)     Problem List Patient Active Problem List   Diagnosis Date Noted   Hypokalemia 01/23/2021   Chronic low back pain 07/17/2020   Essential hypertension 07/17/2020   Hypothyroidism 07/17/2020   Dyslipidemia 07/17/2020   Osteoporosis 07/17/2020   Depression, major, single episode, 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   Osteoarthritis 07/17/2020   OSA on CPAP 07/17/2020   Amanda Mathews, PT CLT [phone] 12/25/2021, 11:06 AM  [facility] [address] [place], [place], [address] Phone: [phone]   Fax:  [phone]  Name: Amanda Mathews MRN: [id number] Date of Birth: [birth year]

## 2021-12-26 ENCOUNTER — Encounter (HOSPITAL_COMMUNITY): Payer: Self-pay | Admitting: Physical Therapy

## 2021-12-26 ENCOUNTER — Ambulatory Visit (HOSPITAL_COMMUNITY): Payer: Medicare PPO | Admitting: Physical Therapy

## 2021-12-26 DIAGNOSIS — R2681 Unsteadiness on feet: Secondary | ICD-10-CM | POA: Diagnosis not present

## 2021-12-26 DIAGNOSIS — R296 Repeated falls: Secondary | ICD-10-CM

## 2021-12-26 DIAGNOSIS — M6281 Muscle weakness (generalized): Secondary | ICD-10-CM | POA: Diagnosis not present

## 2021-12-26 NOTE — Patient Instructions (Signed)
Access Code: [email or web address] URL: [email or web address] Date: 12/26/2021 Prepared by: Georges Lynch  Exercises Sit to Stand with Counter Support - 2-3 x daily - 7 x weekly - 1-2 sets - 10 reps Heel Raises with Counter Support - 2-3 x daily - 7 x weekly - 2 sets - 10 reps Heel Toe Raises with Counter Support - 2-3 x daily - 7 x weekly - 2 sets - 10 reps Standing Tandem Balance with Counter Support - 2-3 x daily - 7 x weekly - 1 sets - 3 reps - 30 second hold

## 2021-12-26 NOTE — Therapy (Signed)
[facility] [address] [place], [place], [address] Phone: [phone]   Fax:  [phone]  Physical Therapy Treatment  Patient Details  Name: Amanda Mathews MRN: [id number] Date of Birth: [birth year] Referring Provider (PT): Jacquiline Doe   Encounter Date: 12/26/2021   PT End of Session - 12/26/21 0938     Visit Number 2    Number of Visits 12    Date for PT Re-Evaluation 02/05/22    Authorization Type [employer]    Authorization Time Period 12 approved    Authorization - Visit Number 2    Authorization - Number of Visits 12    Progress Note Due on Visit 10    PT Start Time 0945    PT Stop Time 1028    PT Time Calculation (min) 43 min    Activity Tolerance Patient tolerated treatment well;Patient limited by fatigue    Behavior During Therapy [facility] for tasks assessed/performed             Past Medical History:  Diagnosis Date   Cataract    Osteoporosis    Sleep apnea    Thyroid disease     Past Surgical History:  Procedure Laterality Date   back stimulator      BUNIONECTOMY     CHOLECYSTECTOMY     FEMUR FRACTURE SURGERY     REPLACEMENT TOTAL KNEE BILATERAL      There were no vitals filed for this visit.   Subjective Assessment - 12/26/21 0944     Subjective Patient reports no new issues. She has completed HEP, noting that hip bridge is the most diffucult.    Pertinent History OA, Chronis low back pain with a stimulator in her back, hx of Rt femur fx    Currently in Pain? No/denies                               [facility] Adult PT Treatment/Exercise - 12/26/21 0001       Knee/Hip Exercises: Standing   Heel Raises Both;2 sets;10 reps    Heel Raises Limitations toe raise 2 x10    Hip Abduction Both;2 sets;10 reps    Hip Extension Both;2 sets;10 reps    Other Standing Knee Exercises step taps 4 inch box x 20 HHA x 1 , semi tandem stance 3 x 20"      Knee/Hip Exercises: Seated   Other Seated  Knee/Hip Exercises seated ab brace 10 x 5"    Sit to Sand 20 reps;with UE support   hands on thighs                      PT Short Term Goals - 12/25/21 1057       PT SHORT TERM GOAL #1   Title PT to be I in HEP to improve strength to decrease risk of falling    Time 3    Period Weeks    Status New    Target Date 01/15/22      PT SHORT TERM GOAL #2   Title PT to be able to single leg stance for at least 8 seconds B to reduce risk of falling    Time 3    Period Weeks    Status New               PT Long Term Goals - 12/25/21 1058  PT LONG TERM GOAL #1   Title PT to be I in advanced HEP to improve balance to decrease risk of falling    Time 6    Period Weeks    Status New    Target Date 02/05/22      PT LONG TERM GOAL #2   Title PT to be able to single leg stance for 12 seconds B    Time 6    Period Weeks    Status New      PT LONG TERM GOAL #3   Title PT leg strength to increase to at least 4/5 to improve functional abiltiy  to include 12 sit to stands in 30 seconds.    Time 6    Period Weeks    Status New      PT LONG TERM GOAL #4   Title no falls in the past 6 weeks    Time 6    Period Weeks    Status New                   Plan - 12/26/21 1024     Clinical Impression Statement Initiated ther ex. Reviewed therapy goals and HEP exercise. Progressed standing LE strengthening activity with added standing hip abduction. Patient challenged with abduction in LLE stance. Patient also demos increased hip drop on LT during alternating step taps on 4 inch box, indicating functional weakness in LLE support. Educated patient on these findings, and on proper form and function of added exercises during todays session. Patient limited by fatigue and did require several breaks for rest. Patient will continue to benefit from skilled therapy services to reduce deficits and improve functional ability.    Personal Factors and Comorbidities  Age;Comorbidity 2    Comorbidities Lumbar stenosis, cervical myelopathy, and Oa    Examination-Activity Limitations Stand;Lift;Locomotion Level;Reach Overhead    Examination-Participation Restrictions Cleaning;Laundry;Shop    Stability/Clinical Decision Making Evolving/Moderate complexity    Rehab Potential Good    PT Frequency 2x / week    PT Duration 6 weeks    PT Treatment/Interventions Patient/family education;Therapeutic activities;Therapeutic exercise;Balance training    PT Next Visit Plan Progress hip strengthening squats, step ups....; and balance    PT Home Exercise Plan standing extension, sitting abdominal set, bridge and supine hip abduction 1/11 sit to stands, heel/ toe raise, tandem stance    Consulted and Agree with Plan of Care Patient             Patient will benefit from skilled therapeutic intervention in order to improve the following deficits and impairments:  Abnormal gait, Decreased activity tolerance, Decreased balance, Decreased range of motion, Difficulty walking, Decreased strength  Visit Diagnosis: Repeated falls  Muscle weakness (generalized)     Problem List Patient Active Problem List   Diagnosis Date Noted   Hypokalemia 01/23/2021   Chronic low back pain 07/17/2020   Essential hypertension 07/17/2020   Hypothyroidism 07/17/2020   Dyslipidemia 07/17/2020   Osteoporosis 07/17/2020   Depression, major, single episode, 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   Osteoarthritis 07/17/2020   OSA on CPAP 07/17/2020   10:29 AM, 12/26/21 Amanda Mathews PT DPT  Physical Therapist with [facility]  [facility]  [phone]   [facility] [address] [place], [place], [address] Phone: [phone]   Fax:  [phone]  Name: Amanda Mathews MRN: [id number] Date of Birth: 1946-07-06

## 2022-01-01 ENCOUNTER — Other Ambulatory Visit: Payer: Self-pay

## 2022-01-01 ENCOUNTER — Ambulatory Visit (HOSPITAL_COMMUNITY): Payer: Medicare PPO

## 2022-01-01 ENCOUNTER — Encounter (HOSPITAL_COMMUNITY): Payer: Self-pay

## 2022-01-01 DIAGNOSIS — R296 Repeated falls: Secondary | ICD-10-CM | POA: Diagnosis not present

## 2022-01-01 DIAGNOSIS — M6281 Muscle weakness (generalized): Secondary | ICD-10-CM

## 2022-01-01 DIAGNOSIS — R2681 Unsteadiness on feet: Secondary | ICD-10-CM | POA: Diagnosis not present

## 2022-01-01 NOTE — Therapy (Signed)
[facility] [address] [place], [place], [address] Phone: [phone]   Fax:  [phone]  Physical Therapy Treatment  Patient Details  Name: Amanda Mathews MRN: [id number] Date of Birth: 06/29/1946 Referring Provider (PT): Jacquiline Doe   Encounter Date: 01/01/2022   PT End of Session - 01/01/22 1056     Visit Number 3    Number of Visits 12    Date for PT Re-Evaluation 02/05/22    Authorization Type [employer]    Authorization Time Period 12 approved    Authorization - Visit Number 3    Authorization - Number of Visits 12    Progress Note Due on Visit 10    PT Start Time 1004    PT Stop Time 1045    PT Time Calculation (min) 41 min    Equipment Utilized During Treatment --   SBA/CGA/ use of RW   Activity Tolerance Patient tolerated treatment well;Patient limited by fatigue    Behavior During Therapy WFL for tasks assessed/performed             Past Medical History:  Diagnosis Date   Cataract    Osteoporosis    Sleep apnea    Thyroid disease     Past Surgical History:  Procedure Laterality Date   back stimulator      BUNIONECTOMY     CHOLECYSTECTOMY     FEMUR FRACTURE SURGERY     REPLACEMENT TOTAL KNEE BILATERAL      There were no vitals filed for this visit.   Subjective Assessment - 01/01/22 1011     Subjective Reports she is feeling good today, reports some shin pain from knees down from 1:00-->7:00 this morning though has resolved when gets to moving.  Reports compliance with HEP regularly at home.    Pertinent History OA, Chronis low back pain with a stimulator in her back, hx of Rt femur fx    Currently in Pain? No/denies                [facility] PT Assessment - 01/01/22 0001       Assessment   Medical Diagnosis History of falling, unsteady gait    Referring Provider (PT) Jacquiline Doe    Onset Date/Surgical Date 02/13/21    Next MD Visit April 2023    Prior Therapy for her back       Precautions   Precautions [facility] Adult PT Treatment/Exercise - 01/01/22 0001       Exercises   Exercises Knee/Hip      Knee/Hip Exercises: Standing   Heel Raises Both;2 sets;10 reps    Hip Flexion Both;10 reps;Knee bent    Hip Flexion Limitations 1 HHA alternating marching 3" holds    Hip Abduction Both;10 reps    Hip Extension Both;5 reps    Forward Step Up Both;10 reps;Hand Hold: 2;Hand Hold: 1;Step Height: 4"    Functional Squat 10 reps    Other Standing Knee Exercises sidestep 3RT front of mat      Knee/Hip Exercises: Seated   Sit to Sand 10 reps;without UE support   eccentric control     Knee/Hip Exercises: Sidelying   Hip ABduction Both;10 reps    Clams 10 x 5" BLE  PT Short Term Goals - 12/25/21 1057       PT SHORT TERM GOAL #1   Title PT to be I in HEP to improve strength to decrease risk of falling    Time 3    Period Weeks    Status New    Target Date 01/15/22      PT SHORT TERM GOAL #2   Title PT to be able to single leg stance for at least 8 seconds B to reduce risk of falling    Time 3    Period Weeks    Status New               PT Long Term Goals - 12/25/21 1058       PT LONG TERM GOAL #1   Title PT to be I in advanced HEP to improve balance to decrease risk of falling    Time 6    Period Weeks    Status New    Target Date 02/05/22      PT LONG TERM GOAL #2   Title PT to be able to single leg stance for 12 seconds B    Time 6    Period Weeks    Status New      PT LONG TERM GOAL #3   Title PT leg strength to increase to at least 4/5 to improve functional abiltiy  to include 12 sit to stands in 30 seconds.    Time 6    Period Weeks    Status New      PT LONG TERM GOAL #4   Title no falls in the past 6 weeks    Time 6    Period Weeks    Status New                   Plan - 01/01/22 1057     Clinical Impression Statement Session focus  with LE strengthening and additional balance training activities.  Added sidelying hip strengthening to HEP with cueing for form and control, able to demonstrate appropriate mechanics following cueing.   Cueing for heel to toe mechanics to improve gait mechanics.  Added marching, sidestep and step up training with HHA required for safety.  Periodic seated rest break required per fatigue, no reports of pain through session.    Personal Factors and Comorbidities Age;Comorbidity 2    Comorbidities Lumbar stenosis, cervical myelopathy, and Oa    Examination-Activity Limitations Stand;Lift;Locomotion Level;Reach Overhead    Examination-Participation Restrictions Cleaning;Laundry;Shop    Stability/Clinical Decision Making Evolving/Moderate complexity    Clinical Decision Making Moderate    Rehab Potential Good    PT Frequency 2x / week    PT Duration 6 weeks    PT Treatment/Interventions Patient/family education;Therapeutic activities;Therapeutic exercise;Balance training    PT Next Visit Plan Gait training, hip strengthening, functional strengthening and balance training.  Progress to SLS activities when ready.  Wall arch for balance and posture if able.    PT Home Exercise Plan standing extension, sitting abdominal set, bridge and supine hip abduction 1/11 sit to stands, heel/ toe raise, tandem stance 1/17: Wback, sidelying clam and abd.    Consulted and Agree with Plan of Care Patient             Patient will benefit from skilled therapeutic intervention in order to improve the following deficits and impairments:  Abnormal gait, Decreased activity tolerance, Decreased balance, Decreased range of motion, Difficulty walking, Decreased strength  Visit Diagnosis:  Repeated falls  Muscle weakness (generalized)     Problem List Patient Active Problem List   Diagnosis Date Noted   Hypokalemia 01/23/2021   Chronic low back pain 07/17/2020   Essential hypertension 07/17/2020   Hypothyroidism  07/17/2020   Dyslipidemia 07/17/2020   Osteoporosis 07/17/2020   Depression, major, single episode, 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   Osteoarthritis 07/17/2020   OSA on CPAP 07/17/2020   Becky Sax, LPTA/CLT; CBIS [phone]  Juel Burrow, PTA 01/01/2022, 11:01 AM  [facility] [address] [place], [place], [address] Phone: [phone]   Fax:  [phone]  Name: Amanda Mathews MRN: [id number] Date of Birth: 02/11/1946

## 2022-01-01 NOTE — Patient Instructions (Addendum)
Scapular Retraction: Abduction (Standing)    With arms elevated and elbows bent to 90, pinch shoulder blades together and press arms back. Repeat 10 times per set. Do 2 sets per session. Do 4 sessions per week.  http://orth.exer.us/951   Copyright  VHI. All rights reserved.    Abduction: Clam (Eccentric) - Side-Lying    Lie on side with knees bent. Lift top knee, keeping feet together. Keep trunk steady. Slowly lower for 3-5 seconds.  10 reps per set, 2 sets per day, 4 days per week.   http://ecce.exer.us/65   Copyright  VHI. All rights reserved.    Abduction: Side Leg Lift (Eccentric) - Side-Lying    Lie on side. Lift top leg slightly higher than shoulder level. Keep top leg straight with body, toes pointing forward.  Slowly lower for 3-5 seconds. 10 reps per set, 2sets per day, 4 days per week.  http://ecce.exer.us/63   Copyright  VHI. All rights reserved.

## 2022-01-03 ENCOUNTER — Ambulatory Visit (HOSPITAL_COMMUNITY): Payer: Medicare PPO | Admitting: Physical Therapy

## 2022-01-03 ENCOUNTER — Other Ambulatory Visit: Payer: Self-pay

## 2022-01-03 DIAGNOSIS — R296 Repeated falls: Secondary | ICD-10-CM

## 2022-01-03 DIAGNOSIS — R2681 Unsteadiness on feet: Secondary | ICD-10-CM | POA: Diagnosis not present

## 2022-01-03 DIAGNOSIS — M6281 Muscle weakness (generalized): Secondary | ICD-10-CM | POA: Diagnosis not present

## 2022-01-03 NOTE — Therapy (Signed)
[facility] [address] [place], [place], [address] Phone: [phone]   Fax:  [phone]  Physical Therapy Treatment  Patient Details  Name: Amanda Mathews MRN: [id number] Date of Birth: 31-Jul-1946 Referring Provider (PT): Jacquiline Doe   Encounter Date: 01/03/2022   PT End of Session - 01/03/22 1045     Visit Number 4    Number of Visits 12    Date for PT Re-Evaluation 02/05/22    Authorization Type [employer]    Authorization Time Period 12 approved    Authorization - Visit Number 4    Authorization - Number of Visits 12    Progress Note Due on Visit 10    PT Start Time 1005    PT Stop Time 1047    PT Time Calculation (min) 42 min    Equipment Utilized During Treatment --   SBA/CGA/ use of RW   Activity Tolerance Patient tolerated treatment well;Patient limited by fatigue    Behavior During Therapy WFL for tasks assessed/performed             Past Medical History:  Diagnosis Date   Cataract    Osteoporosis    Sleep apnea    Thyroid disease     Past Surgical History:  Procedure Laterality Date   back stimulator      BUNIONECTOMY     CHOLECYSTECTOMY     FEMUR FRACTURE SURGERY     REPLACEMENT TOTAL KNEE BILATERAL      There were no vitals filed for this visit.   Subjective Assessment - 01/03/22 1010     Subjective Pt states no falls or issues since being here last.  States from 3am until getting up in the morning and walking around her knees will wake her from aching.    Currently in Pain? No/denies                               [facility] Adult PT Treatment/Exercise - 01/03/22 0001       Knee/Hip Exercises: Standing   Heel Raises 20 reps    Hip Flexion Both;10 reps;Knee bent;2 sets    Hip Flexion Limitations 1 HHA alternating marching 3" holds    Hip Abduction Both;2 sets;10 reps    Hip Extension Both;2 sets;10 reps    Lateral Step Up Both;10 reps;Hand Hold: 1;Step Height: 4"     Forward Step Up Both;10 reps;Hand Hold: 2;Hand Hold: 1;Step Height: 4"    Other Standing Knee Exercises sidestep 5RT in parallel bars without UE assist    Other Standing Knee Exercises tandem stance 30" each with intermittent HHA                       PT Short Term Goals - 12/25/21 1057       PT SHORT TERM GOAL #1   Title PT to be I in HEP to improve strength to decrease risk of falling    Time 3    Period Weeks    Status New    Target Date 01/15/22      PT SHORT TERM GOAL #2   Title PT to be able to single leg stance for at least 8 seconds B to reduce risk of falling    Time 3    Period Weeks    Status New  PT Long Term Goals - 12/25/21 1058       PT LONG TERM GOAL #1   Title PT to be I in advanced HEP to improve balance to decrease risk of falling    Time 6    Period Weeks    Status New    Target Date 02/05/22      PT LONG TERM GOAL #2   Title PT to be able to single leg stance for 12 seconds B    Time 6    Period Weeks    Status New      PT LONG TERM GOAL #3   Title PT leg strength to increase to at least 4/5 to improve functional abiltiy  to include 12 sit to stands in 30 seconds.    Time 6    Period Weeks    Status New      PT LONG TERM GOAL #4   Title no falls in the past 6 weeks    Time 6    Period Weeks    Status New                   Plan - 01/03/22 1050     Clinical Impression Statement Continued wit hLE strengthening and stabilization exercises.  Completed 2 sets of most exercises with intermittent seated rest breaks needed.  Added lateral step ups and tandem stance in bars.  Pt able to maintain static balance approx. 5 second max before needing UE assist.  Pt able to complete sidestepping in bars without use of UEs and was first time being able to do this without UE assit.    Personal Factors and Comorbidities Age;Comorbidity 2    Comorbidities Lumbar stenosis, cervical myelopathy, and Oa     Examination-Activity Limitations Stand;Lift;Locomotion Level;Reach Overhead    Examination-Participation Restrictions Cleaning;Laundry;Shop    Stability/Clinical Decision Making Evolving/Moderate complexity    Rehab Potential Good    PT Frequency 2x / week    PT Duration 6 weeks    PT Treatment/Interventions Patient/family education;Therapeutic activities;Therapeutic exercise;Balance training    PT Next Visit Plan Gait training, hip strengthening, functional strengthening and balance training.  Progress to SLS activities when ready.  Wall arch for balance and posture if able.    PT Home Exercise Plan standing extension, sitting abdominal set, bridge and supine hip abduction 1/11 sit to stands, heel/ toe raise, tandem stance 1/17: Wback, sidelying clam and abd.    Consulted and Agree with Plan of Care Patient             Patient will benefit from skilled therapeutic intervention in order to improve the following deficits and impairments:  Abnormal gait, Decreased activity tolerance, Decreased balance, Decreased range of motion, Difficulty walking, Decreased strength  Visit Diagnosis: Repeated falls  Muscle weakness (generalized)     Problem List Patient Active Problem List   Diagnosis Date Noted   Hypokalemia 01/23/2021   Chronic low back pain 07/17/2020   Essential hypertension 07/17/2020   Hypothyroidism 07/17/2020   Dyslipidemia 07/17/2020   Osteoporosis 07/17/2020   Depression, major, single episode, 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   Osteoarthritis 07/17/2020   OSA on CPAP 07/17/2020   Lurena Nida, PTA/CLT, WTA [phone]  Lurena Nida, PTA 01/03/2022, 10:50 AM  [facility] [address] [place], [place], [address] Phone: [phone]   Fax:  [phone]  Name: Amanda Mathews MRN: [id number] Date of Birth: [birth year]

## 2022-01-08 ENCOUNTER — Encounter (HOSPITAL_COMMUNITY): Payer: Self-pay

## 2022-01-08 ENCOUNTER — Telehealth: Payer: Self-pay

## 2022-01-08 ENCOUNTER — Other Ambulatory Visit: Payer: Self-pay

## 2022-01-08 ENCOUNTER — Ambulatory Visit (HOSPITAL_COMMUNITY): Payer: Medicare PPO

## 2022-01-08 DIAGNOSIS — R296 Repeated falls: Secondary | ICD-10-CM | POA: Diagnosis not present

## 2022-01-08 DIAGNOSIS — M6281 Muscle weakness (generalized): Secondary | ICD-10-CM

## 2022-01-08 DIAGNOSIS — R2681 Unsteadiness on feet: Secondary | ICD-10-CM | POA: Diagnosis not present

## 2022-01-08 NOTE — Telephone Encounter (Signed)
Please advise  Patient had Zoster Live x3

## 2022-01-08 NOTE — Telephone Encounter (Signed)
Patient called in and wanted to know if she needed to get another shingles shot she stated she has 3 but unsure if needed another one. Please advise.

## 2022-01-08 NOTE — Patient Instructions (Signed)
Single Leg Balance: Eyes Open    Stand on right leg with eyes open. Hold 30 seconds. 5 reps 2 times per day.  http://ggbe.exer.us/5   Copyright  VHI. All rights reserved.   ABDUCTION: Standing (Active)    Stand, feet flat. Lift right leg out to side. Complete 2 sets of 10 repetitions.  http://gtsc.exer.us/111   Copyright  VHI. All rights reserved.

## 2022-01-08 NOTE — Therapy (Signed)
[facility] [address] [place], [place], [address] Phone: [phone]   Fax:  [phone]  Physical Therapy Treatment  Patient Details  Name: Amanda Mathews MRN: [id number] Date of Birth: March 15, 1946 Referring Provider (PT): Jacquiline Doe   Encounter Date: 01/08/2022   PT End of Session - 01/08/22 1043     Visit Number 5    Number of Visits 12    Date for PT Re-Evaluation 02/05/22    Authorization Type [employer]    Authorization Time Period 12 approved    Authorization - Visit Number 5    Authorization - Number of Visits 12    Progress Note Due on Visit 10    PT Start Time 1038    PT Stop Time 1122    PT Time Calculation (min) 44 min    Equipment Utilized During Treatment --   SBA/CGA/ use of RW   Activity Tolerance Patient tolerated treatment well;Patient limited by fatigue    Behavior During Therapy WFL for tasks assessed/performed             Past Medical History:  Diagnosis Date   Cataract    Osteoporosis    Sleep apnea    Thyroid disease     Past Surgical History:  Procedure Laterality Date   back stimulator      BUNIONECTOMY     CHOLECYSTECTOMY     FEMUR FRACTURE SURGERY     REPLACEMENT TOTAL KNEE BILATERAL      There were no vitals filed for this visit.   Subjective Assessment - 01/08/22 1041     Subjective Pt reports [facility] and feels she may have over done it.  Reports increased LBP and lateral hip pain today, pain scale 3/10. No reports of recent fall.    Pertinent History OA, Chronis low back pain with a stimulator in her back, hx of Rt femur fx    Currently in Pain? Yes    Pain Score 3     Pain Location Hip    Pain Orientation Right;Lateral    Pain Descriptors / Indicators Aching;Sore    Pain Onset More than a month ago    Pain Frequency Intermittent                [facility] PT Assessment - 01/08/22 0001       Assessment   Medical Diagnosis History of  falling, unsteady gait    Referring Provider (PT) Jacquiline Doe    Onset Date/Surgical Date 02/13/21    Next MD Visit 2/13 pain management; [name] April 2023    Prior Therapy for her back      Precautions   Precautions [facility] Adult PT Treatment/Exercise - 01/08/22 0001       Knee/Hip Exercises: Standing   Heel Raises 20 reps    Hip Flexion Both;10 reps;Knee bent;2 sets    Hip Flexion Limitations 1 HHA alternating toe tapping 6in step height    Hip Abduction Both;2 sets;10 reps    Hip Extension Both;2 sets;10 reps    Forward Step Up Both;10 reps;Hand Hold: 2;Hand Hold: 1;Step Height: 6"    Functional Squat 10 reps    Functional Squat Limitations 3D hip excursion    SLS BLE max of 5 2-3"    SLS with Vectors 2x  5" with HHA    Other Standing Knee Exercises sidestep 5RT in parallel bars without UE assist; wall arch 5x    Other Standing Knee Exercises tandem stance 2x 30" each with intermittent HHA      Knee/Hip Exercises: Seated   Sit to Sand 10 reps;without UE support   eccentric control                      PT Short Term Goals - 12/25/21 1057       PT SHORT TERM GOAL #1   Title PT to be I in HEP to improve strength to decrease risk of falling    Time 3    Period Weeks    Status New    Target Date 01/15/22      PT SHORT TERM GOAL #2   Title PT to be able to single leg stance for at least 8 seconds B to reduce risk of falling    Time 3    Period Weeks    Status New               PT Long Term Goals - 12/25/21 1058       PT LONG TERM GOAL #1   Title PT to be I in advanced HEP to improve balance to decrease risk of falling    Time 6    Period Weeks    Status New    Target Date 02/05/22      PT LONG TERM GOAL #2   Title PT to be able to single leg stance for 12 seconds B    Time 6    Period Weeks    Status New      PT LONG TERM GOAL #3   Title PT leg strength to increase to at least 4/5 to  improve functional abiltiy  to include 12 sit to stands in 30 seconds.    Time 6    Period Weeks    Status New      PT LONG TERM GOAL #4   Title no falls in the past 6 weeks    Time 6    Period Weeks    Status New                   Plan - 01/08/22 1306     Clinical Impression Statement Pt arrived with antalgic gait mechanics and reports of Rt hip pain.  Added 3D hip excursion to improve weight bearing with reports of pain reduced following mobility exercise.  Session focus with hip strengthening and balance activities.  Pt posture with forward trunk, cueing to improve posture assisted with pain and balance.  Added SLS based activities that required HHA. EOS pt reports pain reduced feels more weakness than pain at EOS.    Personal Factors and Comorbidities Age;Comorbidity 2    Comorbidities Lumbar stenosis, cervical myelopathy, and Oa    Examination-Activity Limitations Stand;Lift;Locomotion Level;Reach Overhead    Examination-Participation Restrictions Cleaning;Laundry;Shop    Stability/Clinical Decision Making Evolving/Moderate complexity    Clinical Decision Making Moderate    Rehab Potential Good    PT Frequency 2x / week    PT Duration 6 weeks    PT Treatment/Interventions Patient/family education;Therapeutic activities;Therapeutic exercise;Balance training    PT Next Visit Plan Gait training, hip strengthening, functional strengthening and balance training.  Progress to SLS activities when ready.  Wall arch for balance and posture if able.    PT Home Exercise Plan standing  extension, sitting abdominal set, bridge and supine hip abduction 1/11 sit to stands, heel/ toe raise, tandem stance 1/17: Wback, sidelying clam and abd.; 1/24:  SLS    Consulted and Agree with Plan of Care Patient             Patient will benefit from skilled therapeutic intervention in order to improve the following deficits and impairments:  Abnormal gait, Decreased activity tolerance, Decreased  balance, Decreased range of motion, Difficulty walking, Decreased strength  Visit Diagnosis: Repeated falls  Muscle weakness (generalized)     Problem List Patient Active Problem List   Diagnosis Date Noted   Hypokalemia 01/23/2021   Chronic low back pain 07/17/2020   Essential hypertension 07/17/2020   Hypothyroidism 07/17/2020   Dyslipidemia 07/17/2020   Osteoporosis 07/17/2020   Depression, major, single episode, 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   Osteoarthritis 07/17/2020   OSA on CPAP 07/17/2020   Becky Sax, LPTA/CLT; CBIS [phone]  Juel Burrow, PTA 01/08/2022, 1:10 PM   [facility] [address] [place], [place], [address] Phone: [phone]   Fax:  [phone]  Name: Kenyotta Dorfman MRN: [id number] Date of Birth: 1946-11-16

## 2022-01-09 ENCOUNTER — Telehealth: Payer: Self-pay

## 2022-01-09 NOTE — Telephone Encounter (Signed)
Patient believes she had a shingles shot in early 2021 but is not sure. She wants to know if she needs another one. If so, can she go to [facility] or should she come to the office.

## 2022-01-09 NOTE — Telephone Encounter (Signed)
Left message to return call to our office at their convenience.  

## 2022-01-09 NOTE — Telephone Encounter (Signed)
The recommendation per current guidelines is for her to get the shingrix x 2 even if she has had older shingles vaccines.  Katina Degree. [name], MD 01/09/2022 7:41 AM

## 2022-01-10 ENCOUNTER — Ambulatory Visit (HOSPITAL_COMMUNITY): Payer: Medicare PPO

## 2022-01-10 ENCOUNTER — Other Ambulatory Visit: Payer: Self-pay

## 2022-01-10 ENCOUNTER — Encounter (HOSPITAL_COMMUNITY): Payer: Self-pay

## 2022-01-10 DIAGNOSIS — R296 Repeated falls: Secondary | ICD-10-CM

## 2022-01-10 DIAGNOSIS — M6281 Muscle weakness (generalized): Secondary | ICD-10-CM | POA: Diagnosis not present

## 2022-01-10 DIAGNOSIS — R2681 Unsteadiness on feet: Secondary | ICD-10-CM | POA: Diagnosis not present

## 2022-01-10 NOTE — Therapy (Signed)
[place], [place], [address] Phone: [phone]   Fax:  [phone]  Physical Therapy Treatment  Patient Details  Name: Amanda Mathews MRN: [id number] Date of Birth: 12-19-1945 Referring Provider (PT): Dimas Chyle   Encounter Date: 01/10/2022   PT End of Session - 01/10/22 0925     Visit Number 6    Number of Visits 12    Date for PT Re-Evaluation 02/05/22    Authorization Type [employer]    Authorization Time Period 12 approved 1/10-->02/05/22    Authorization - Visit Number 6    Authorization - Number of Visits 12    Progress Note Due on Visit 10    PT Start Time 0920    PT Stop Time 1000    PT Time Calculation (min) 40 min    Equipment Utilized During Treatment Gait belt    Activity Tolerance Patient tolerated treatment well;Patient limited by fatigue    Behavior During Therapy WFL for tasks assessed/performed             Past Medical History:  Diagnosis Date   Cataract    Osteoporosis    Sleep apnea    Thyroid disease     Past Surgical History:  Procedure Laterality Date   back stimulator      BUNIONECTOMY     CHOLECYSTECTOMY     FEMUR FRACTURE SURGERY     REPLACEMENT TOTAL KNEE BILATERAL      There were no vitals filed for this visit.   Subjective Assessment - 01/10/22 0924     Subjective Pt stated she feels a lot better than she felt on Tuesday.  No reports of pain or recent fall.    Pertinent History OA, Chronis low back pain with a stimulator in her back, hx of Rt femur fx    Currently in Pain? No/denies                               [facility] Adult PT Treatment/Exercise - 01/10/22 0001       Exercises   Exercises Knee/Hip      Knee/Hip Exercises: Standing   Heel Raises 20 reps    Hip Flexion Both;10 reps;Knee bent;2 sets    Hip Flexion Limitations 1 HHA alternating toe tapping 6in step height    Hip Abduction Both;2 sets;10 reps    Abduction  Limitations GTB around thigh 3" holds    Forward Step Up Both;10 reps;Hand Hold: 2;Hand Hold: 1;Step Height: 6"    Functional Squat 10 reps    Functional Squat Limitations 3D hip excursion    SLS BLE max of 5 2-3"    Other Standing Knee Exercises paloff RTB NBOS 15x 2    Other Standing Knee Exercises shoulder extension and rows 10x RTB each                       PT Short Term Goals - 12/25/21 1057       PT SHORT TERM GOAL #1   Title PT to be I in HEP to improve strength to decrease risk of falling    Time 3    Period Weeks    Status New    Target Date 01/15/22      PT SHORT TERM GOAL #2   Title PT to be able to single leg stance for at least 8 seconds B to reduce  risk of falling    Time 3    Period Weeks    Status New               PT Long Term Goals - 12/25/21 1058       PT LONG TERM GOAL #1   Title PT to be I in advanced HEP to improve balance to decrease risk of falling    Time 6    Period Weeks    Status New    Target Date 02/05/22      PT LONG TERM GOAL #2   Title PT to be able to single leg stance for 12 seconds B    Time 6    Period Weeks    Status New      PT LONG TERM GOAL #3   Title PT leg strength to increase to at least 4/5 to improve functional abiltiy  to include 12 sit to stands in 30 seconds.    Time 6    Period Weeks    Status New      PT LONG TERM GOAL #4   Title no falls in the past 6 weeks    Time 6    Period Weeks    Status New                   Plan - 01/10/22 [id number]     Clinical Impression Statement Continued session focus with functional strengthening and balance training.  Utilized mirror to improve awareness of posture through session.  Added paloff for core and postural strenghtening with theraband.  No reports of pain through session, was limited by fatigue required periodic seated rest breaks.    Personal Factors and Comorbidities Age;Comorbidity 2    Comorbidities Lumbar stenosis, cervical myelopathy,  and Oa    Examination-Activity Limitations Stand;Lift;Locomotion Level;Reach Overhead    Examination-Participation Restrictions Cleaning;Laundry;Shop    Stability/Clinical Decision Making Evolving/Moderate complexity    Clinical Decision Making Moderate    Rehab Potential Good    PT Frequency 2x / week    PT Duration 6 weeks    PT Treatment/Interventions Patient/family education;Therapeutic activities;Therapeutic exercise;Balance training    PT Next Visit Plan Gait training with SPC, hip strengthening, functional strengthening and balance training.  Progress to SLS activities when ready.  Wall arch for balance and posture if able.    PT Home Exercise Plan standing extension, sitting abdominal set, bridge and supine hip abduction 1/11 sit to stands, heel/ toe raise, tandem stance 1/17: Wback, sidelying clam and abd.; 1/24:  SLS; sidestep in front of counter    Consulted and Agree with Plan of Care Patient             Patient will benefit from skilled therapeutic intervention in order to improve the following deficits and impairments:  Abnormal gait, Decreased activity tolerance, Decreased balance, Decreased range of motion, Difficulty walking, Decreased strength  Visit Diagnosis: Repeated falls  Muscle weakness (generalized)     Problem List Patient Active Problem List   Diagnosis Date Noted   Hypokalemia 01/23/2021   Chronic low back pain 07/17/2020   Essential hypertension 07/17/2020   Hypothyroidism 07/17/2020   Dyslipidemia 07/17/2020   Osteoporosis 07/17/2020   Depression, major, single episode, complete remission ([place]) 07/17/2020   Osteoarthritis 07/17/2020   OSA on CPAP 07/17/2020   Ihor Austin, LPTA/CLT; CBIS [phone]  Aldona Lento, PTA 01/10/2022, 10:12 AM  [place], [place], [address]  Phone: [phone]   Fax:  [phone]  Name: Amanda Mathews MRN: [id number] Date of  Birth: 02-25-1946

## 2022-01-11 NOTE — Telephone Encounter (Signed)
Pt notified that she has had both vaccines. 09/16/17 & 04/01/18

## 2022-01-11 NOTE — Telephone Encounter (Signed)
See other TE.

## 2022-01-15 ENCOUNTER — Other Ambulatory Visit: Payer: Self-pay

## 2022-01-15 ENCOUNTER — Ambulatory Visit (HOSPITAL_COMMUNITY): Payer: Medicare PPO | Admitting: Physical Therapy

## 2022-01-15 VITALS — HR 106

## 2022-01-15 DIAGNOSIS — R296 Repeated falls: Secondary | ICD-10-CM | POA: Diagnosis not present

## 2022-01-15 DIAGNOSIS — R2681 Unsteadiness on feet: Secondary | ICD-10-CM | POA: Diagnosis not present

## 2022-01-15 DIAGNOSIS — M6281 Muscle weakness (generalized): Secondary | ICD-10-CM

## 2022-01-15 NOTE — Therapy (Signed)
[place], [place], [address] Phone: [phone]   Fax:  [phone]  Physical Therapy Treatment  Patient Details  Name: Amanda Mathews MRN: [id number] Date of Birth: September 04, 1946 Referring Provider (PT): Dimas Chyle   Encounter Date: 01/15/2022   PT End of Session - 01/15/22 1041     Visit Number 7    Number of Visits 12    Date for PT Re-Evaluation 02/05/22    Authorization Type [employer]    Authorization Time Period 12 approved 1/10-->02/05/22    Authorization - Visit Number 7    Authorization - Number of Visits 12    Progress Note Due on Visit 10    PT Start Time 1037    PT Stop Time 1115    PT Time Calculation (min) 38 min    Equipment Utilized During Treatment Gait belt    Activity Tolerance Patient tolerated treatment well;Patient limited by fatigue    Behavior During Therapy WFL for tasks assessed/performed             Past Medical History:  Diagnosis Date   Cataract    Osteoporosis    Sleep apnea    Thyroid disease     Past Surgical History:  Procedure Laterality Date   back stimulator      BUNIONECTOMY     CHOLECYSTECTOMY     FEMUR FRACTURE SURGERY     REPLACEMENT TOTAL KNEE BILATERAL      Vitals:   01/15/22 1107 01/15/22 1112  Pulse: 96 (!) 106  SpO2: 94% 96%     Subjective Assessment - 01/15/22 1040     Subjective Patient reports that things have been going well at home and that she has not had any falls. She is trying to go to the gym every day for about 45 minutes with the bicycles.    Pertinent History OA, Chronis low back pain with a stimulator in her back, hx of Rt femur fx    Currently in Pain? No/denies                               [facility] Adult PT Treatment/Exercise - 01/15/22 0001       Neuro Re-ed    Neuro Re-ed Details  Cable walking #2 1x4RT each direction with 3lbs AWs for all      Knee/Hip Exercises: Aerobic   Nustep x28min                        PT Short Term Goals - 12/25/21 1057       PT SHORT TERM GOAL #1   Title PT to be I in HEP to improve strength to decrease risk of falling    Time 3    Period Weeks    Status New    Target Date 01/15/22      PT SHORT TERM GOAL #2   Title PT to be able to single leg stance for at least 8 seconds B to reduce risk of falling    Time 3    Period Weeks    Status New               PT Long Term Goals - 12/25/21 1058       PT LONG TERM GOAL #1   Title PT to be I in advanced HEP to improve balance to decrease risk of falling  Time 6    Period Weeks    Status New    Target Date 02/05/22      PT LONG TERM GOAL #2   Title PT to be able to single leg stance for 12 seconds B    Time 6    Period Weeks    Status New      PT LONG TERM GOAL #3   Title PT leg strength to increase to at least 4/5 to improve functional abiltiy  to include 12 sit to stands in 30 seconds.    Time 6    Period Weeks    Status New      PT LONG TERM GOAL #4   Title no falls in the past 6 weeks    Time 6    Period Weeks    Status New                   Plan - 01/15/22 1241     Clinical Impression Statement Patient tolerated treatment well during today's session, but did struggle with the cable walking most of which being the side to side direction. She was still unstable during ambulation following the treatment session, but her LE movements were quick and step length was appropriate. There were 5+ near LOB, but none of which that required therapist intervention.    Personal Factors and Comorbidities Age;Comorbidity 2    Comorbidities Lumbar stenosis, cervical myelopathy, and Oa    Examination-Activity Limitations Stand;Lift;Locomotion Level;Reach Overhead    Examination-Participation Restrictions Cleaning;Laundry;Shop    Stability/Clinical Decision Making Evolving/Moderate complexity    Rehab Potential Good    PT Frequency 2x / week    PT Duration 6 weeks     PT Treatment/Interventions Patient/family education;Therapeutic activities;Therapeutic exercise;Balance training    PT Next Visit Plan Gait training with SPC, hip strengthening, functional strengthening and balance training.  Progress to SLS activities when ready.  Wall arch for balance and posture if able.    PT Home Exercise Plan standing extension, sitting abdominal set, bridge and supine hip abduction 1/11 sit to stands, heel/ toe raise, tandem stance 1/17: Wback, sidelying clam and abd.; 1/24:  SLS; sidestep in front of counter    Consulted and Agree with Plan of Care Patient             Patient will benefit from skilled therapeutic intervention in order to improve the following deficits and impairments:  Abnormal gait, Decreased activity tolerance, Decreased balance, Decreased range of motion, Difficulty walking, Decreased strength  Visit Diagnosis: Repeated falls  Muscle weakness (generalized)     Problem List Patient Active Problem List   Diagnosis Date Noted   Hypokalemia 01/23/2021   Chronic low back pain 07/17/2020   Essential hypertension 07/17/2020   Hypothyroidism 07/17/2020   Dyslipidemia 07/17/2020   Osteoporosis 07/17/2020   Depression, major, single episode, complete remission ([place]) 07/17/2020   Osteoarthritis 07/17/2020   OSA on CPAP 07/17/2020    Adalberto Cole, PT 01/15/2022, 12:43 PM  [facility] [address] [place], [place], [address] Phone: [phone]   Fax:  [phone]  Name: Amanda Mathews MRN: [id number] Date of Birth: [birth year]

## 2022-01-17 ENCOUNTER — Other Ambulatory Visit: Payer: Self-pay | Admitting: Family Medicine

## 2022-01-17 ENCOUNTER — Other Ambulatory Visit: Payer: Self-pay

## 2022-01-17 ENCOUNTER — Encounter (HOSPITAL_COMMUNITY): Payer: Self-pay | Admitting: Physical Therapy

## 2022-01-17 ENCOUNTER — Ambulatory Visit (HOSPITAL_COMMUNITY): Payer: Medicare PPO | Attending: Family Medicine | Admitting: Physical Therapy

## 2022-01-17 DIAGNOSIS — M6281 Muscle weakness (generalized): Secondary | ICD-10-CM | POA: Diagnosis not present

## 2022-01-17 DIAGNOSIS — R296 Repeated falls: Secondary | ICD-10-CM | POA: Diagnosis not present

## 2022-01-17 NOTE — Therapy (Signed)
[facility] [address] [place], [place], [address] Phone: [phone]   Fax:  [phone]  Physical Therapy Treatment  Patient Details  Name: Amanda Mathews MRN: [id number] Date of Birth: Jan 16, 1946 Referring Provider (PT): Jacquiline Doe   Encounter Date: 01/17/2022   PT End of Session - 01/17/22 1200     Visit Number 8    Number of Visits 12    Date for PT Re-Evaluation 02/05/22    Authorization Type [employer]    Authorization Time Period 12 approved 1/10-->02/05/22    Authorization - Visit Number 8    Authorization - Number of Visits 12    Progress Note Due on Visit 10    PT Start Time 1137    PT Stop Time 1217    PT Time Calculation (min) 40 min    Equipment Utilized During Treatment Gait belt    Activity Tolerance Patient tolerated treatment well;Patient limited by fatigue    Behavior During Therapy WFL for tasks assessed/performed             Past Medical History:  Diagnosis Date   Cataract    Osteoporosis    Sleep apnea    Thyroid disease     Past Surgical History:  Procedure Laterality Date   back stimulator      BUNIONECTOMY     CHOLECYSTECTOMY     FEMUR FRACTURE SURGERY     REPLACEMENT TOTAL KNEE BILATERAL      There were no vitals filed for this visit.   Subjective Assessment - 01/17/22 1216     Subjective Pt states that she continues to go to the gym and continues to do her exercises.  She has pain in her lower legs at night but when she gets up and walks around she if fine.    Pertinent History OA, Chronis low back pain with a stimulator in her back, hx of Rt femur fx    Currently in Pain? No/denies                               [facility] Adult PT Treatment/Exercise - 01/17/22 0001       Exercises   Exercises Knee/Hip      Knee/Hip Exercises: Stretches   Gastroc Stretch Limitations slantboard 3 x 30"      Knee/Hip Exercises: Aerobic   Nustep x11min      Knee/Hip Exercises:  Standing   Forward Step Up Both;10 reps;Hand Hold: 2;Hand Hold: 1;Step Height: 6"                 Balance Exercises - 01/17/22 0001       Balance Exercises: Standing   Tandem Stance Eyes closed;Other reps (comment)   with head turns x 10   SLS Eyes open;5 reps    Sidestepping 2 reps    Heel Raises 15 reps    Toe Raise 15 reps    Sit to Stand Standard surface                  PT Short Term Goals - 12/25/21 1057       PT SHORT TERM GOAL #1   Title PT to be I in HEP to improve strength to decrease risk of falling    Time 3    Period Weeks    Status New    Target Date 01/15/22      PT SHORT TERM  GOAL #2   Title PT to be able to single leg stance for at least 8 seconds B to reduce risk of falling    Time 3    Period Weeks    Status New               PT Long Term Goals - 12/25/21 1058       PT LONG TERM GOAL #1   Title PT to be I in advanced HEP to improve balance to decrease risk of falling    Time 6    Period Weeks    Status New    Target Date 02/05/22      PT LONG TERM GOAL #2   Title PT to be able to single leg stance for 12 seconds B    Time 6    Period Weeks    Status New      PT LONG TERM GOAL #3   Title PT leg strength to increase to at least 4/5 to improve functional abiltiy  to include 12 sit to stands in 30 seconds.    Time 6    Period Weeks    Status New      PT LONG TERM GOAL #4   Title no falls in the past 6 weeks    Time 6    Period Weeks    Status New                   Plan - 01/17/22 1200     Clinical Impression Statement Todays treatment focused on balance with pt needing supervision to gontact gaurd assist.  PT will continue to benefit from balance activity to assist in decreasing her falls.    Personal Factors and Comorbidities Age;Comorbidity 2    Comorbidities Lumbar stenosis, cervical myelopathy, and Oa    Examination-Activity Limitations Stand;Lift;Locomotion Level;Reach Overhead     Examination-Participation Restrictions Cleaning;Laundry;Shop    Stability/Clinical Decision Making Evolving/Moderate complexity    Rehab Potential Good    PT Frequency 2x / week    PT Duration 6 weeks    PT Treatment/Interventions Patient/family education;Therapeutic activities;Therapeutic exercise;Balance training    PT Next Visit Plan Gait training with SPC, hip strengthening, functional strengthening and balance training. .  Add wall arch into program    PT Home Exercise Plan standing extension, sitting abdominal set, bridge and supine hip abduction 1/11 sit to stands, heel/ toe raise, tandem stance 1/17: Wback, sidelying clam and abd.; 1/24:  SLS; sidestep in front of counter    Consulted and Agree with Plan of Care Patient             Patient will benefit from skilled therapeutic intervention in order to improve the following deficits and impairments:  Abnormal gait, Decreased activity tolerance, Decreased balance, Decreased range of motion, Difficulty walking, Decreased strength  Visit Diagnosis: Repeated falls  Muscle weakness (generalized)     Problem List Patient Active Problem List   Diagnosis Date Noted   Hypokalemia 01/23/2021   Chronic low back pain 07/17/2020   Essential hypertension 07/17/2020   Hypothyroidism 07/17/2020   Dyslipidemia 07/17/2020   Osteoporosis 07/17/2020   Depression, major, single episode, 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   Osteoarthritis 07/17/2020   OSA on CPAP 07/17/2020    Virgina Organ, PT CLT [phone]  01/17/2022, 12:18 PM  [facility] [address] [place], [place], [address] Phone: [phone]   Fax:  [phone]  Name: Amanda Mathews MRN: [id number] Date  of Birth: 09-20-1946

## 2022-01-22 ENCOUNTER — Ambulatory Visit (HOSPITAL_COMMUNITY): Payer: Medicare PPO | Admitting: Physical Therapy

## 2022-01-22 ENCOUNTER — Other Ambulatory Visit: Payer: Self-pay

## 2022-01-22 DIAGNOSIS — R296 Repeated falls: Secondary | ICD-10-CM | POA: Diagnosis not present

## 2022-01-22 DIAGNOSIS — M6281 Muscle weakness (generalized): Secondary | ICD-10-CM

## 2022-01-22 NOTE — Therapy (Signed)
[facility] [address] [place], [place], [address] Phone: [phone]   Fax:  [phone]  Physical Therapy Treatment  Patient Details  Name: Amanda Mathews MRN: [id number] Date of Birth: 1946/09/25 Referring Provider (PT): Jacquiline Doe   Encounter Date: 01/22/2022   PT End of Session - 01/22/22 0955     Visit Number 9    Number of Visits 12    Date for PT Re-Evaluation 02/05/22    Authorization Type [employer]    Authorization Time Period 12 approved 1/10-->02/05/22    Authorization - Visit Number 8    Authorization - Number of Visits 12    Progress Note Due on Visit 10    PT Start Time 0955    PT Stop Time 1035    PT Time Calculation (min) 40 min    Equipment Utilized During Treatment Gait belt    Activity Tolerance Patient tolerated treatment well;Patient limited by fatigue    Behavior During Therapy WFL for tasks assessed/performed             Past Medical History:  Diagnosis Date   Cataract    Osteoporosis    Sleep apnea    Thyroid disease     Past Surgical History:  Procedure Laterality Date   back stimulator      BUNIONECTOMY     CHOLECYSTECTOMY     FEMUR FRACTURE SURGERY     REPLACEMENT TOTAL KNEE BILATERAL      There were no vitals filed for this visit.   Subjective Assessment - 01/22/22 0957     Subjective PT states she overdid it at the gym yesterday.    Pertinent History OA, Chronis low back pain with a stimulator in her back, hx of Rt femur fx    Currently in Pain? No/denies                               [facility] Adult PT Treatment/Exercise - 01/22/22 0001       Exercises   Exercises Knee/Hip      Knee/Hip Exercises: Stretches   Gastroc Stretch Limitations slantboard 3 x 30"      Knee/Hip Exercises: Standing   Heel Raises --   wall arch with heel raises x 10   Forward Step Up Both;10 reps;Step Height: 4"    Forward Step Up Limitations power up    Other Standing Knee  Exercises back and shoulder to wall B UE flexion to 90 degrees      Knee/Hip Exercises: Seated   Sit to Sand 10 reps                 Balance Exercises - 01/22/22 0001       Balance Exercises: Standing   Standing Eyes Opened Narrow base of support (BOS);Foam/compliant surface;5 reps   with head turns   Tandem Stance Eyes closed;Other reps (comment)   with head turns x 10   SLS Eyes open;5 reps    Sidestepping 2 reps    Marching 15 reps    Toe Raise 15 reps                  PT Short Term Goals - 01/22/22 1041       PT SHORT TERM GOAL #1   Title PT to be I in HEP to improve strength to decrease risk of falling    Time 3    Period  Weeks    Status Achieved    Target Date 01/15/22      PT SHORT TERM GOAL #2   Title PT to be able to single leg stance for at least 8 seconds B to reduce risk of falling    Time 3    Period Weeks    Status On-going               PT Long Term Goals - 01/22/22 1041       PT LONG TERM GOAL #1   Title PT to be I in advanced HEP to improve balance to decrease risk of falling    Time 6    Period Weeks    Status On-going    Target Date 02/05/22      PT LONG TERM GOAL #2   Title PT to be able to single leg stance for 12 seconds B    Time 6    Period Weeks    Status On-going      PT LONG TERM GOAL #3   Title PT leg strength to increase to at least 4/5 to improve functional abiltiy  to include 12 sit to stands in 30 seconds.    Time 6    Period Weeks    Status On-going      PT LONG TERM GOAL #4   Title no falls in the past 6 weeks    Time 6    Period Weeks    Status On-going                   Plan - 01/22/22 2505     Clinical Impression Statement Continued to focus on balance this treatment with supervision assistance only.  Added marching, standing against wall with UE flexion and wall arch    Personal Factors and Comorbidities Age;Comorbidity 2    Comorbidities Lumbar stenosis, cervical myelopathy, and  Oa    Examination-Activity Limitations Stand;Lift;Locomotion Level;Reach Overhead    Examination-Participation Restrictions Cleaning;Laundry;Shop    Stability/Clinical Decision Making Evolving/Moderate complexity    Rehab Potential Good    PT Frequency 2x / week    PT Duration 6 weeks    PT Treatment/Interventions Patient/family education;Therapeutic activities;Therapeutic exercise;Balance training    PT Next Visit Plan reassess    PT Home Exercise Plan standing extension, sitting abdominal set, bridge and supine hip abduction 1/11 sit to stands, heel/ toe raise, tandem stance 1/17: Wback, sidelying clam and abd.; 1/24:  SLS; sidestep in front of counter    Consulted and Agree with Plan of Care Patient             Patient will benefit from skilled therapeutic intervention in order to improve the following deficits and impairments:  Abnormal gait, Decreased activity tolerance, Decreased balance, Decreased range of motion, Difficulty walking, Decreased strength  Visit Diagnosis: Repeated falls  Muscle weakness (generalized)     Problem List Patient Active Problem List   Diagnosis Date Noted   Hypokalemia 01/23/2021   Chronic low back pain 07/17/2020   Essential hypertension 07/17/2020   Hypothyroidism 07/17/2020   Dyslipidemia 07/17/2020   Osteoporosis 07/17/2020   Depression, major, single episode, 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   Osteoarthritis 07/17/2020   OSA on CPAP 07/17/2020   Virgina Organ, PT CLT [phone]  01/22/2022, 10:42 AM  [facility] [address] [place], [place], [address] Phone: [phone]   Fax:  [phone]  Name: Amanda Mathews MRN: [id number] Date of Birth: 07/07/1946

## 2022-01-23 ENCOUNTER — Other Ambulatory Visit: Payer: Self-pay | Admitting: Family Medicine

## 2022-01-24 ENCOUNTER — Encounter (HOSPITAL_COMMUNITY): Payer: Self-pay | Admitting: Physical Therapy

## 2022-01-24 ENCOUNTER — Ambulatory Visit (HOSPITAL_COMMUNITY): Payer: Medicare PPO | Admitting: Physical Therapy

## 2022-01-24 ENCOUNTER — Other Ambulatory Visit: Payer: Self-pay

## 2022-01-24 DIAGNOSIS — M6281 Muscle weakness (generalized): Secondary | ICD-10-CM | POA: Diagnosis not present

## 2022-01-24 DIAGNOSIS — R296 Repeated falls: Secondary | ICD-10-CM

## 2022-01-24 NOTE — Therapy (Signed)
[facility] [address] [place], [place], [address] Phone: [phone]   Fax:  [phone]  Physical Therapy Treatment  Patient Details  Name: Amanda Mathews MRN: [id number] Date of Birth: 02/14/1946 Referring Provider (PT): Jacquiline Doe   Encounter Date: 01/24/2022   PT End of Session - 01/24/22 1034     Visit Number 10    Number of Visits 12    Date for PT Re-Evaluation 02/05/22    Authorization Type [employer]    Authorization Time Period 12 approved 1/10-->02/05/22;had yolanda request extension to 2/23 as pt will be out of town and wants to finish out her last two visits    Authorization - Visit Number 10    Authorization - Number of Visits 12    Progress Note Due on Visit 10    PT Start Time 1000    PT Stop Time 1048    PT Time Calculation (min) 48 min    Equipment Utilized During Treatment Gait belt    Activity Tolerance Patient tolerated treatment well;Patient limited by fatigue    Behavior During Therapy WFL for tasks assessed/performed             Past Medical History:  Diagnosis Date   Cataract    Osteoporosis    Sleep apnea    Thyroid disease     Past Surgical History:  Procedure Laterality Date   back stimulator      BUNIONECTOMY     CHOLECYSTECTOMY     FEMUR FRACTURE SURGERY     REPLACEMENT TOTAL KNEE BILATERAL      There were no vitals filed for this visit.   Subjective Assessment - 01/24/22 1007     Subjective Amanda Mathews states that she feels like she is 70% better.    Pertinent History OA, Chronis low back pain with a stimulator in her back, hx of Rt femur fx    How long can you sit comfortably? no problem    How long can you stand comfortably? Pt can stand for a few minutes now before she could not stand.    How long can you walk comfortably? walks with a walker in the home only    Currently in Pain? No/denies                [facility] PT Assessment - 01/24/22 0001       Assessment    Medical Diagnosis History of falling, unsteady gait    Referring Provider (PT) Jacquiline Doe    Onset Date/Surgical Date 02/13/21    Next MD Visit not scheduled    Prior Therapy for her back      Precautions   Precautions Fall      Restrictions   Weight Bearing Restrictions No      Home Environment   Living Environment Private residence    Type of Home House    Home Access Stairs to enter    Entrance Stairs-Number of Steps 3    Entrance Stairs-Rails Right    Additional Comments does ot complete in a reciprocal manner      Prior Function   Level of Independence Independent    Vocation Retired    Leisure work out,      Copy Status Within Functional Limits for tasks assessed      Functional Tests   Functional tests Single leg stance;Sit to Stand      Single Leg Stance   Comments Rt:  4' was  2"; Lt:4" -0"      Sit to Stand   Comments 10 in 30 seconds   was 10 in 30 seconds     AROM   Lumbar Extension 15 was 8      Strength   Right Hip Flexion 4+/5   was 3+   Right Hip Extension 3-/5   wsa 2/5   Right Hip ABduction 3+/5   ws 2+   Left Hip Flexion 5/5    Left Hip Extension 2+/5   was 2/5/ tested sl as pt can not tolerate prone   Left Hip ABduction 3/5   was 3/5   Right Knee Extension 5/5   was 4+   Left Knee Flexion 5/5   tested sl as pt can not be on their stomach   Left Knee Extension 5/5    Right Ankle Dorsiflexion 5/5    Left Ankle Dorsiflexion 5/5                           [facility] Adult PT Treatment/Exercise - 01/24/22 0001       Exercises   Exercises Lumbar      Lumbar Exercises: Stretches   Single Knee to Chest Stretch Left;Right;3 reps;30 seconds      Lumbar Exercises: Sidelying   Other Sidelying Lumbar Exercises hip extension x 10B      Knee/Hip Exercises: Aerobic   Nustep x59min      Knee/Hip Exercises: Standing   SLS BLE max of 4"      Knee/Hip Exercises: Seated   Sit to Sand 10 reps      Knee/Hip  Exercises: Supine   Bridges 10 reps      Knee/Hip Exercises: Sidelying   Hip ABduction Both;10 reps                       PT Short Term Goals - 01/24/22 1033       PT SHORT TERM GOAL #1   Title PT to be I in HEP to improve strength to decrease risk of falling    Time 3    Period Weeks    Status Achieved    Target Date 01/15/22      PT SHORT TERM GOAL #2   Title PT to be able to single leg stance for at least 8 seconds B to reduce risk of falling    Time 3    Period Weeks    Status On-going               PT Long Term Goals - 01/24/22 1033       PT LONG TERM GOAL #1   Title PT to be I in advanced HEP to improve balance to decrease risk of falling    Time 6    Period Weeks    Status Achieved    Target Date 02/05/22      PT LONG TERM GOAL #2   Title PT to be able to single leg stance for 12 seconds B    Time 6    Period Weeks    Status On-going      PT LONG TERM GOAL #3   Title PT leg strength to increase to at least 4/5 to improve functional abiltiy  to include 12 sit to stands in 30 seconds.    Time 6    Period Weeks    Status On-going  PT LONG TERM GOAL #4   Title no falls in the past 6 weeks    Time 6    Period Weeks    Status Achieved                   Plan - 01/24/22 1053     Clinical Impression Statement Pt reassessed.  Although her balance and strength remain significantly decreased they are all significantly better than when pt started therapy.  Pt would like to continue for her two visits she has left but will be out of town next week.  At that point she requests to work on her own as she realizes that her strength and balance will not come back quickly.    Personal Factors and Comorbidities Age;Comorbidity 2    Comorbidities Lumbar stenosis, cervical myelopathy, and Oa    Examination-Activity Limitations Stand;Lift;Locomotion Level;Reach Overhead    Examination-Participation Restrictions Cleaning;Laundry;Shop     Stability/Clinical Decision Making Evolving/Moderate complexity    Rehab Potential Good    PT Frequency 2x / week    PT Duration 6 weeks    PT Treatment/Interventions Patient/family education;Therapeutic activities;Therapeutic exercise;Balance training    PT Next Visit Plan reassess    PT Home Exercise Plan standing extension, sitting abdominal set, bridge and supine hip abduction 1/11 sit to stands, heel/ toe raise, tandem stance 1/17: Wback, sidelying clam and abd.; 1/24:  SLS; sidestep in front of counter;2/9:  knee to chest stretch, sidelying hip extension    Consulted and Agree with Plan of Care Patient             Patient will benefit from skilled therapeutic intervention in order to improve the following deficits and impairments:  Abnormal gait, Decreased activity tolerance, Decreased balance, Decreased range of motion, Difficulty walking, Decreased strength  Visit Diagnosis: Repeated falls  Muscle weakness (generalized)     Problem List Patient Active Problem List   Diagnosis Date Noted   Hypokalemia 01/23/2021   Chronic low back pain 07/17/2020   Essential hypertension 07/17/2020   Hypothyroidism 07/17/2020   Dyslipidemia 07/17/2020   Osteoporosis 07/17/2020   Depression, major, single episode, 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   Osteoarthritis 07/17/2020   OSA on CPAP 07/17/2020   Virgina Organ, PT CLT [phone]  01/24/2022, 10:56 AM   [facility] [address] [place], [place], [address] Phone: [phone]   Fax:  [phone]  Name: Harshika Mago MRN: [id number] Date of Birth: [birth year]

## 2022-01-28 DIAGNOSIS — M545 Low back pain, unspecified: Secondary | ICD-10-CM | POA: Diagnosis not present

## 2022-01-28 DIAGNOSIS — R269 Unspecified abnormalities of gait and mobility: Secondary | ICD-10-CM | POA: Diagnosis not present

## 2022-01-28 DIAGNOSIS — M47816 Spondylosis without myelopathy or radiculopathy, lumbar region: Secondary | ICD-10-CM | POA: Diagnosis not present

## 2022-01-28 DIAGNOSIS — G8929 Other chronic pain: Secondary | ICD-10-CM | POA: Diagnosis not present

## 2022-01-29 ENCOUNTER — Encounter (HOSPITAL_COMMUNITY): Payer: Medicare PPO

## 2022-01-29 DIAGNOSIS — R600 Localized edema: Secondary | ICD-10-CM | POA: Diagnosis not present

## 2022-01-29 DIAGNOSIS — M25572 Pain in left ankle and joints of left foot: Secondary | ICD-10-CM | POA: Diagnosis not present

## 2022-01-29 DIAGNOSIS — M2170 Unequal limb length (acquired), unspecified site: Secondary | ICD-10-CM | POA: Diagnosis not present

## 2022-01-29 DIAGNOSIS — R2689 Other abnormalities of gait and mobility: Secondary | ICD-10-CM | POA: Diagnosis not present

## 2022-01-29 DIAGNOSIS — L909 Atrophic disorder of skin, unspecified: Secondary | ICD-10-CM | POA: Diagnosis not present

## 2022-01-31 ENCOUNTER — Encounter (HOSPITAL_COMMUNITY): Payer: Medicare PPO | Admitting: Physical Therapy

## 2022-02-05 ENCOUNTER — Ambulatory Visit (HOSPITAL_COMMUNITY): Payer: Medicare PPO | Admitting: Physical Therapy

## 2022-02-05 ENCOUNTER — Other Ambulatory Visit: Payer: Self-pay

## 2022-02-05 DIAGNOSIS — M6281 Muscle weakness (generalized): Secondary | ICD-10-CM

## 2022-02-05 DIAGNOSIS — R296 Repeated falls: Secondary | ICD-10-CM | POA: Diagnosis not present

## 2022-02-05 NOTE — Therapy (Signed)
[facility] [address] [place], [place], [address] Phone: [phone]   Fax:  [phone]  Physical Therapy Treatment  Patient Details  Name: Amanda Mathews MRN: [id number] Date of Birth: 05/01/1946 Referring Provider (PT): Jacquiline Doe   Encounter Date: 02/05/2022   PT End of Session - 02/05/22 1317     Visit Number 11    Number of Visits 12    Date for PT Re-Evaluation 02/08/22    Authorization Type [employer]    Authorization Time Period 12 approved 1/10-->02/05/22;had yolanda request extension to 2/23 as pt will be out of town and wants to finish out her last two visits;2 visits approved thru 2/25    Authorization - Visit Number 11    Authorization - Number of Visits 12    Progress Note Due on Visit 12    PT Start Time 1320    PT Stop Time 1400    PT Time Calculation (min) 40 min    Equipment Utilized During Treatment Gait belt    Activity Tolerance Patient tolerated treatment well;Patient limited by fatigue    Behavior During Therapy WFL for tasks assessed/performed             Past Medical History:  Diagnosis Date   Cataract    Osteoporosis    Sleep apnea    Thyroid disease     Past Surgical History:  Procedure Laterality Date   back stimulator      BUNIONECTOMY     CHOLECYSTECTOMY     FEMUR FRACTURE SURGERY     REPLACEMENT TOTAL KNEE BILATERAL      There were no vitals filed for this visit.   Subjective Assessment - 02/05/22 1328     Subjective Ms. [name] states that she is having pain in her Lt leg, states that it comes and goesl    Currently in Pain? Yes    Pain Score 5     Pain Location Back    Pain Orientation Left    Pain Descriptors / Indicators Aching    Pain Radiating Towards to knee    Pain Onset More than a month ago    Pain Frequency Intermittent                      [facility] Adult PT Treatment/Exercise - 02/05/22 0001       Exercises   Exercises Lumbar      Lumbar  Exercises: Standing   Other Standing Lumbar Exercises wall arch, Y lift off; hip extension while supported on counter. all x 10      Lumbar Exercises: Seated   Sit to Stand 10 reps                 Balance Exercises - 02/05/22 0001       Balance Exercises: Standing   Tandem Stance Eyes open;5 reps   head turns.   SLS Eyes open;5 reps    Sidestepping 3 reps    Other Standing Exercises cervical retraction                  PT Short Term Goals - 01/24/22 1033       PT SHORT TERM GOAL #1   Title PT to be I in HEP to improve strength to decrease risk of falling    Time 3    Period Weeks    Status Achieved    Target Date 01/15/22      PT  SHORT TERM GOAL #2   Title PT to be able to single leg stance for at least 8 seconds B to reduce risk of falling    Time 3    Period Weeks    Status On-going               PT Long Term Goals - 01/24/22 1033       PT LONG TERM GOAL #1   Title PT to be I in advanced HEP to improve balance to decrease risk of falling    Time 6    Period Weeks    Status Achieved    Target Date 02/05/22      PT LONG TERM GOAL #2   Title PT to be able to single leg stance for 12 seconds B    Time 6    Period Weeks    Status On-going      PT LONG TERM GOAL #3   Title PT leg strength to increase to at least 4/5 to improve functional abiltiy  to include 12 sit to stands in 30 seconds.    Time 6    Period Weeks    Status On-going      PT LONG TERM GOAL #4   Title no falls in the past 6 weeks    Time 6    Period Weeks    Status Achieved                   Plan - 02/05/22 1357     Clinical Impression Statement Added cervical/scapular retraction, wall arch and wall lift off as well as counter hip extension to pt program.  Pt has one last session which we will carry forward objective findings from last visit and answer any questions    Personal Factors and Comorbidities Age;Comorbidity 2    Comorbidities Lumbar stenosis,  cervical myelopathy, and Oa    Examination-Activity Limitations Stand;Lift;Locomotion Level;Reach Overhead    Examination-Participation Restrictions Cleaning;Laundry;Shop    Stability/Clinical Decision Making Evolving/Moderate complexity    Rehab Potential Good    PT Frequency 2x / week    PT Duration 6 weeks    PT Treatment/Interventions Patient/family education;Therapeutic activities;Therapeutic exercise;Balance training    PT Next Visit Plan discharge.    PT Home Exercise Plan standing extension, sitting abdominal set, bridge and supine hip abduction 1/11 sit to stands, heel/ toe raise, tandem stance 1/17: Wback, sidelying clam and abd.; 1/24:  SLS; sidestep in front of counter;2/9:  knee to chest stretch, sidelying hip extension; 2/21:  wall arch, wall lift off, cervical and scapular retraction.    Consulted and Agree with Plan of Care Patient             Patient will benefit from skilled therapeutic intervention in order to improve the following deficits and impairments:  Abnormal gait, Decreased activity tolerance, Decreased balance, Decreased range of motion, Difficulty walking, Decreased strength  Visit Diagnosis: Repeated falls  Muscle weakness (generalized)     Problem List Patient Active Problem List   Diagnosis Date Noted   Hypokalemia 01/23/2021   Chronic low back pain 07/17/2020   Essential hypertension 07/17/2020   Hypothyroidism 07/17/2020   Dyslipidemia 07/17/2020   Osteoporosis 07/17/2020   Depression, major, single episode, 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   Osteoarthritis 07/17/2020   OSA on CPAP 07/17/2020  Virgina Organ, PT CLT [phone]  02/05/2022, 2:01 PM  [facility] [address] [place], [place], [address] Phone: [phone]  Fax:  [phone]  Name: Amanda Mathews MRN: [id number] Date of Birth: January 19, 1946

## 2022-02-07 ENCOUNTER — Ambulatory Visit (HOSPITAL_COMMUNITY): Payer: Medicare PPO | Admitting: Physical Therapy

## 2022-02-07 ENCOUNTER — Other Ambulatory Visit: Payer: Self-pay

## 2022-02-07 DIAGNOSIS — R296 Repeated falls: Secondary | ICD-10-CM

## 2022-02-07 DIAGNOSIS — M6281 Muscle weakness (generalized): Secondary | ICD-10-CM

## 2022-02-07 NOTE — Therapy (Signed)
[facility] [address] [place], [place], [address] Phone: [phone]   Fax:  [phone]  Physical Therapy Treatment  Patient Details  Name: Amanda Mathews MRN: [id number] Date of Birth: 05-Nov-1946 Referring Provider (PT): Dimas Chyle  PHYSICAL THERAPY DISCHARGE SUMMARY  Visits from Start of Care: 12  Current functional level related to goals / functional outcomes: See below   Remaining deficits: Decreased balance and strength   Education / Equipment: HEP   Patient agrees to discharge. Patient goals were partially met. Patient is being discharged due to being pleased with the current functional level.  Encounter Date: 02/07/2022   PT End of Session - 02/07/22 0850     Visit Number 12    Number of Visits 12    Date for PT Re-Evaluation 02/08/22    Authorization Type [employer]    Authorization Time Period 12 approved 1/10-->02/05/22;had yolanda request extension to 2/23 as pt will be out of town and wants to finish out her last two visits;2 visits approved thru 2/25    Authorization - Visit Number 12    Authorization - Number of Visits 12    Progress Note Due on Visit 12    PT Start Time 0830    PT Stop Time 0900    PT Time Calculation (min) 30 min    Equipment Utilized During Treatment Gait belt    Activity Tolerance Patient tolerated treatment well;Patient limited by fatigue    Behavior During Therapy WFL for tasks assessed/performed             Past Medical History:  Diagnosis Date   Cataract    Osteoporosis    Sleep apnea    Thyroid disease     Past Surgical History:  Procedure Laterality Date   back stimulator      BUNIONECTOMY     CHOLECYSTECTOMY     FEMUR FRACTURE SURGERY     REPLACEMENT TOTAL KNEE BILATERAL      There were no vitals filed for this visit.   Subjective Assessment - 02/07/22 0831     Subjective Ms. Colclasure states that she feels like she is 70% better.  Pt is going to the gym every  morning unless she has a MD appointment in [place].    Pertinent History OA, Chronis low back pain with a stimulator in her back, hx of Rt femur fx    How long can you sit comfortably? no problem    How long can you stand comfortably? Pt can stand for a few minutes now before she could not stand.    How long can you walk comfortably? walks with a walker in the home only    Currently in Pain? Yes    Pain Score 3     Pain Location Back    Pain Orientation Left    Pain Descriptors / Indicators Aching    Pain Type Chronic pain    Pain Radiating Towards to knee    Pain Onset More than a month ago                [facility] PT Assessment - 02/07/22 0001       Assessment   Medical Diagnosis History of falling, unsteady gait    Referring Provider (PT) Dimas Chyle    Onset Date/Surgical Date 02/13/21    Next MD Visit not scheduled    Prior Therapy for her back      Precautions   Precautions Fall  Restrictions   Weight Bearing Restrictions No      Home Environment   Living Environment Private residence    Type of [place] to enter    Entrance Stairs-Number of Steps 3    Entrance Stairs-Rails Right    Additional Comments does ot complete in a reciprocal manner      Prior Function   Level of Independence Independent    Vocation Retired    Leisure work out,      Charity fundraiser Status Within Functional Limits for tasks assessed      Functional Tests   Functional tests Single leg stance;Sit to Stand      Single Leg Stance   Comments Rt: 4' was  2"; Lt:4" -0"      Sit to Stand   Comments 10 in 30 seconds   was 10 in 30 seconds     AROM   Lumbar Extension 15 was 8      Strength   Right Hip Flexion 4+/5   was 3+   Right Hip Extension 3-/5   wsa 2/5   Right Hip ABduction 3+/5   ws 2+   Left Hip Flexion 5/5    Left Hip Extension 2+/5   was 2/5/ tested sl as pt can not tolerate prone   Left Hip ABduction 3/5   was 3/5   Right  Knee Extension 5/5   was 4+   Left Knee Flexion 5/5   tested sl as pt can not be on their stomach   Left Knee Extension 5/5    Right Ankle Dorsiflexion 5/5    Left Ankle Dorsiflexion 5/5                           [facility] Adult PT Treatment/Exercise - 02/07/22 0001       Exercises   Exercises Lumbar      Lumbar Exercises: Stretches   Single Knee to Chest Stretch Left;Right;30 seconds;4 reps    Lower Trunk Rotation 5 reps      Lumbar Exercises: Standing   Other Standing Lumbar Exercises tandem stance x 3      Lumbar Exercises: Seated   Sit to Stand 10 reps      Lumbar Exercises: Supine   Bridge 15 reps      Lumbar Exercises: Sidelying   Hip Abduction Left;10 reps                     PT Education - 02/07/22 0849     Education Details Gave pt four main exercises to work on    [employer]) Educated Patient    Methods Explanation    Comprehension Verbalized understanding;Returned demonstration              PT Short Term Goals - 01/24/22 1033       PT SHORT TERM GOAL #1   Title PT to be I in HEP to improve strength to decrease risk of falling    Time 3    Period Weeks    Status Achieved    Target Date 01/15/22      PT SHORT TERM GOAL #2   Title PT to be able to single leg stance for at least 8 seconds B to reduce risk of falling    Time 3    Period Weeks    Status On-going  PT Long Term Goals - 01/24/22 1033       PT LONG TERM GOAL #1   Title PT to be I in advanced HEP to improve balance to decrease risk of falling    Time 6    Period Weeks    Status Achieved    Target Date 02/05/22      PT LONG TERM GOAL #2   Title PT to be able to single leg stance for 12 seconds B    Time 6    Period Weeks    Status On-going      PT LONG TERM GOAL #3   Title PT leg strength to increase to at least 4/5 to improve functional abiltiy  to include 12 sit to stands in 30 seconds.    Time 6    Period Weeks    Status  On-going      PT LONG TERM GOAL #4   Title no falls in the past 6 weeks    Time 6    Period Weeks    Status Achieved                   Plan - 02/07/22 0850     Clinical Impression Statement Today is pt final session.  All questions and concerns were addressed.  Therapist encouraged pt to continue going to the [facility].  Therapist also scaled down exercises to four important ones that pt should be doing daily instructed pt that the other exercises are good and add a few in when she has time.    Personal Factors and Comorbidities Age;Comorbidity 2    Comorbidities Lumbar stenosis, cervical myelopathy, and Oa    Examination-Activity Limitations Stand;Lift;Locomotion Level;Reach Overhead    Examination-Participation Restrictions Cleaning;Laundry;Shop    Stability/Clinical Decision Making Evolving/Moderate complexity    Rehab Potential Good    PT Frequency 2x / week    PT Duration 6 weeks    PT Treatment/Interventions Patient/family education;Therapeutic activities;Therapeutic exercise;Balance training    PT Next Visit Plan discharge.    PT Home Exercise Plan standing extension, sitting abdominal set, bridge and supine hip abduction 1/11 sit to stands, heel/ toe raise, tandem stance 1/17: Wback, sidelying clam and abd.; 1/24:  SLS; sidestep in front of counter;2/9:  knee to chest stretch, sidelying hip extension; 2/21:  wall arch, wall lift off, cervical and scapular retraction.2/23:  knee to chest, bridge, sidelying abductin and tandem stance in corner.    Consulted and Agree with Plan of Care Patient             Patient will benefit from skilled therapeutic intervention in order to improve the following deficits and impairments:  Abnormal gait, Decreased activity tolerance, Decreased balance, Decreased range of motion, Difficulty walking, Decreased strength  Visit Diagnosis: Repeated falls  Muscle weakness (generalized)     Problem List Patient Active Problem List    Diagnosis Date Noted   Hypokalemia 01/23/2021   Chronic low back pain 07/17/2020   Essential hypertension 07/17/2020   Hypothyroidism 07/17/2020   Dyslipidemia 07/17/2020   Osteoporosis 07/17/2020   Depression, major, single episode, complete remission ([place]) 07/17/2020   Osteoarthritis 07/17/2020   OSA on CPAP 07/17/2020   Rayetta Humphrey, PT CLT [phone]  02/07/2022, 10:00 [facility] [address] [place], [place], [address] Phone: [phone]   Fax:  [phone]  Name: Amanda Mathews MRN: [id number] Date of Birth: [birth year]

## 2022-02-08 ENCOUNTER — Other Ambulatory Visit: Payer: Self-pay | Admitting: Family Medicine

## 2022-02-16 ENCOUNTER — Other Ambulatory Visit: Payer: Self-pay | Admitting: Family Medicine

## 2022-02-19 ENCOUNTER — Encounter (HOSPITAL_COMMUNITY): Payer: Self-pay

## 2022-02-19 ENCOUNTER — Other Ambulatory Visit: Payer: Self-pay

## 2022-02-19 ENCOUNTER — Emergency Department (HOSPITAL_COMMUNITY): Payer: Medicare PPO

## 2022-02-19 ENCOUNTER — Emergency Department (HOSPITAL_COMMUNITY)
Admission: EM | Admit: 2022-02-19 | Discharge: 2022-02-19 | Disposition: A | Discharge status: Home / Self Care | Payer: Medicare PPO | Attending: Emergency Medicine | Admitting: Emergency Medicine

## 2022-02-19 DIAGNOSIS — X500XXA Overexertion from strenuous movement or load, initial encounter: Secondary | ICD-10-CM | POA: Insufficient documentation

## 2022-02-19 DIAGNOSIS — S4992XA Unspecified injury of left shoulder and upper arm, initial encounter: Secondary | ICD-10-CM | POA: Diagnosis present

## 2022-02-19 DIAGNOSIS — M79602 Pain in left arm: Secondary | ICD-10-CM | POA: Diagnosis not present

## 2022-02-19 DIAGNOSIS — R0789 Other chest pain: Secondary | ICD-10-CM | POA: Insufficient documentation

## 2022-02-19 DIAGNOSIS — S46212A Strain of muscle, fascia and tendon of other parts of biceps, left arm, initial encounter: Secondary | ICD-10-CM | POA: Insufficient documentation

## 2022-02-19 DIAGNOSIS — R079 Chest pain, unspecified: Secondary | ICD-10-CM | POA: Diagnosis not present

## 2022-02-19 DIAGNOSIS — Y9239 Other specified sports and athletic area as the place of occurrence of the external cause: Secondary | ICD-10-CM | POA: Insufficient documentation

## 2022-02-19 LAB — CBC WITH DIFFERENTIAL/PLATELET
Abs Immature Granulocytes: 0.02 10*3/uL (ref 0.00–0.07)
Basophils Absolute: 0.1 10*3/uL (ref 0.0–0.1)
Basophils Relative: 1 %
Eosinophils Absolute: 0.1 10*3/uL (ref 0.0–0.5)
Eosinophils Relative: 1 %
HCT: 42.6 % (ref 36.0–46.0)
Hemoglobin: 13.9 g/dL (ref 12.0–15.0)
Immature Granulocytes: 0 %
Lymphocytes Relative: 8 %
Lymphs Abs: 0.7 10*3/uL (ref 0.7–4.0)
MCH: 30.7 pg (ref 26.0–34.0)
MCHC: 32.6 g/dL (ref 30.0–36.0)
MCV: 94 fL (ref 80.0–100.0)
Monocytes Absolute: 0.7 10*3/uL (ref 0.1–1.0)
Monocytes Relative: 7 %
Neutro Abs: 7.8 10*3/uL — ABNORMAL HIGH (ref 1.7–7.7)
Neutrophils Relative %: 83 %
Platelets: 222 10*3/uL (ref 150–400)
RBC: 4.53 MIL/uL (ref 3.87–5.11)
RDW: 12.4 % (ref 11.5–15.5)
WBC: 9.3 10*3/uL (ref 4.0–10.5)
nRBC: 0 % (ref 0.0–0.2)

## 2022-02-19 LAB — COMPREHENSIVE METABOLIC PANEL
ALT: 14 U/L (ref 0–44)
AST: 15 U/L (ref 15–41)
Albumin: 3.9 g/dL (ref 3.5–5.0)
Alkaline Phosphatase: 92 U/L (ref 38–126)
Anion gap: 9 (ref 5–15)
BUN: 20 mg/dL (ref 8–23)
CO2: 28 mmol/L (ref 22–32)
Calcium: 9 mg/dL (ref 8.9–10.3)
Chloride: 104 mmol/L (ref 98–111)
Creatinine, Ser: 0.93 mg/dL (ref 0.44–1.00)
GFR, Estimated: >60 mL/min (ref >60)
Glucose, Bld: 116 mg/dL — ABNORMAL HIGH (ref 70–99)
Potassium: 3.4 mmol/L — ABNORMAL LOW (ref 3.5–5.1)
Sodium: 141 mmol/L (ref 135–145)
Total Bilirubin: 0.7 mg/dL (ref 0.3–1.2)
Total Protein: 6.5 g/dL (ref 6.5–8.1)

## 2022-02-19 LAB — TROPONIN I (HIGH SENSITIVITY)
Troponin I (High Sensitivity): 3 ng/L (ref <18)
Troponin I (High Sensitivity): 4 ng/L (ref <18)

## 2022-02-19 LAB — LIPASE, BLOOD: Lipase: 32 U/L (ref 11–51)

## 2022-02-19 IMAGING — DX DG CHEST 1V PORT
1 series · 1 of 1 positions shown · non-contrast
Comparison: No priors.

CLINICAL DATA: 75-year-old female with history of chest pain and
arm pain.

EXAM:
PORTABLE CHEST 1 VIEW

[chest ap]
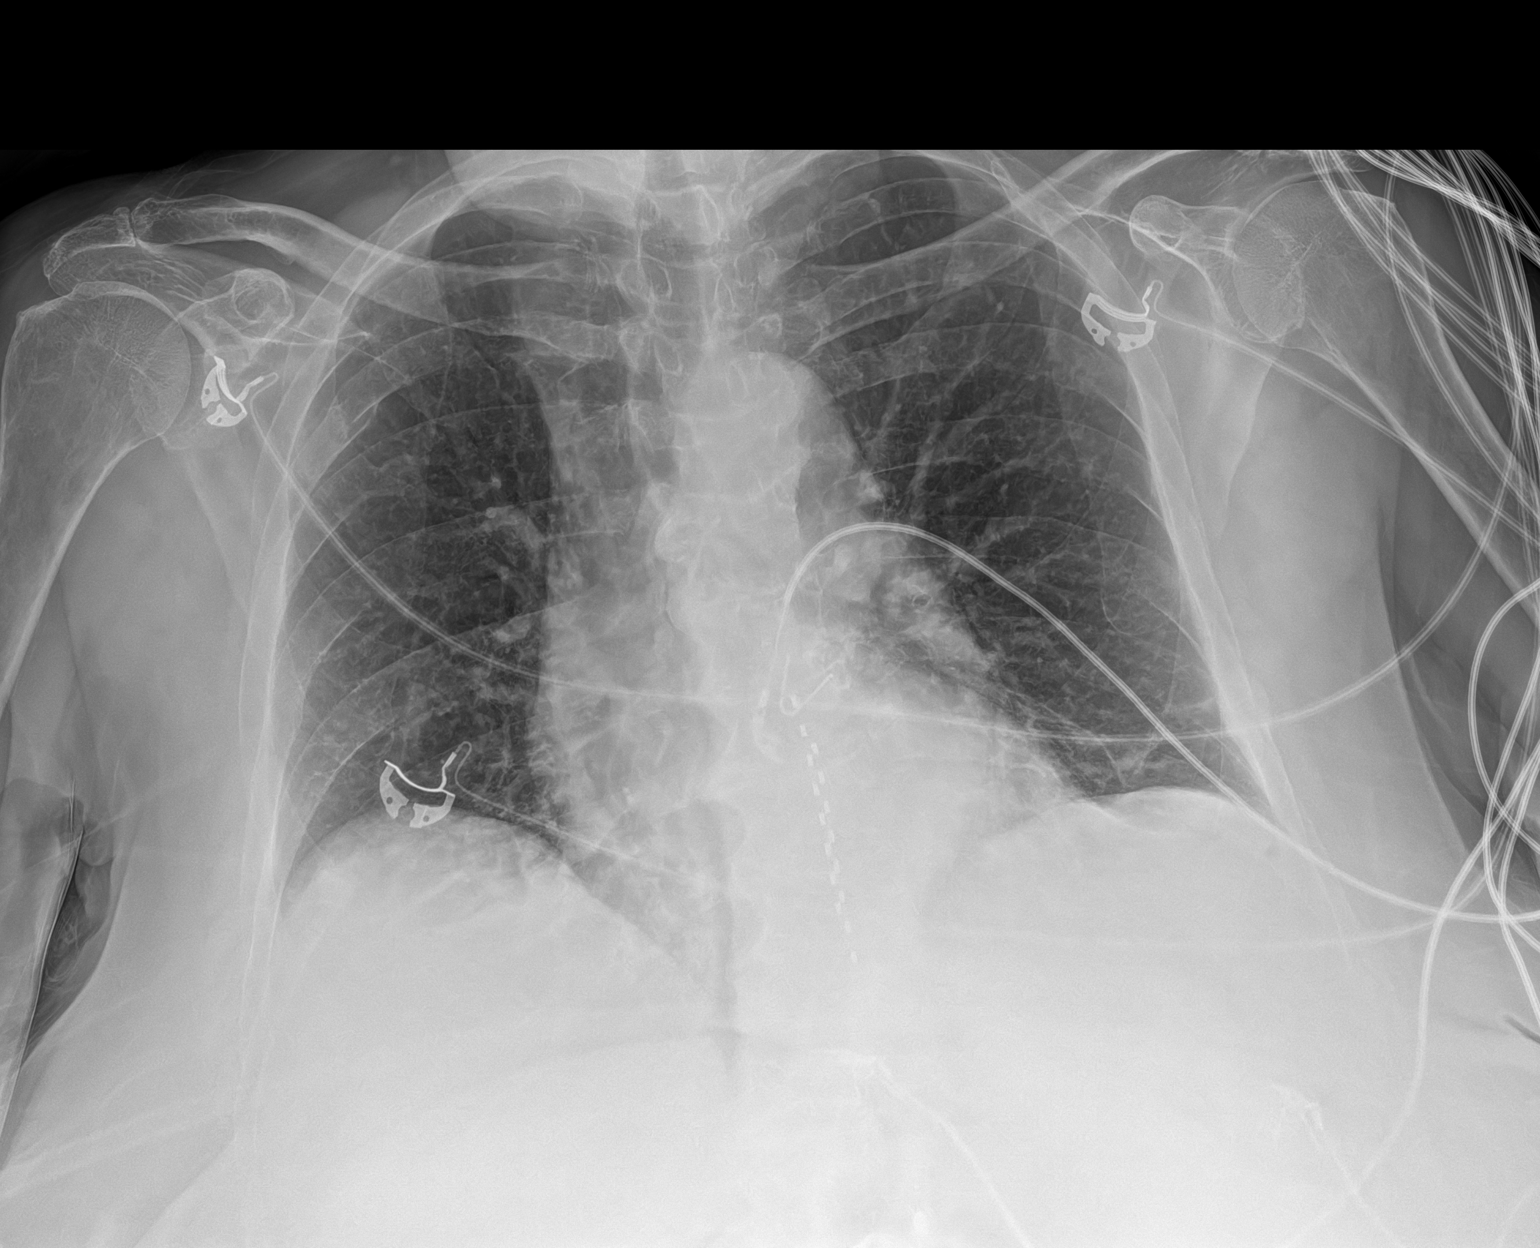

[1 of 1 positions shown; findings below may reference images not displayed]

FINDINGS: Lung volumes are low. No consolidative airspace disease. No pleural
effusions. No pneumothorax. No pulmonary nodule or mass noted.
Pulmonary vasculature and the cardiomediastinal silhouette are
within normal limits. Spinal cord stimulator projecting over the
lower thoracic region. Old healed fractures of the posterolateral
aspect of the right fourth rib and lateral right sixth rib
incidentally noted.
IMPRESSION: 1. Low lung volumes without radiographic evidence of acute
cardiopulmonary disease.

## 2022-02-19 IMAGING — DX DG SHOULDER 2+V*L*
2 series · 2 of 2 positions shown · non-contrast
Comparison: No priors.

CLINICAL DATA: 75-year-old female with history of arm pain.

EXAM:
LEFT SHOULDER - 2+ VIEW

[shoulder ap]
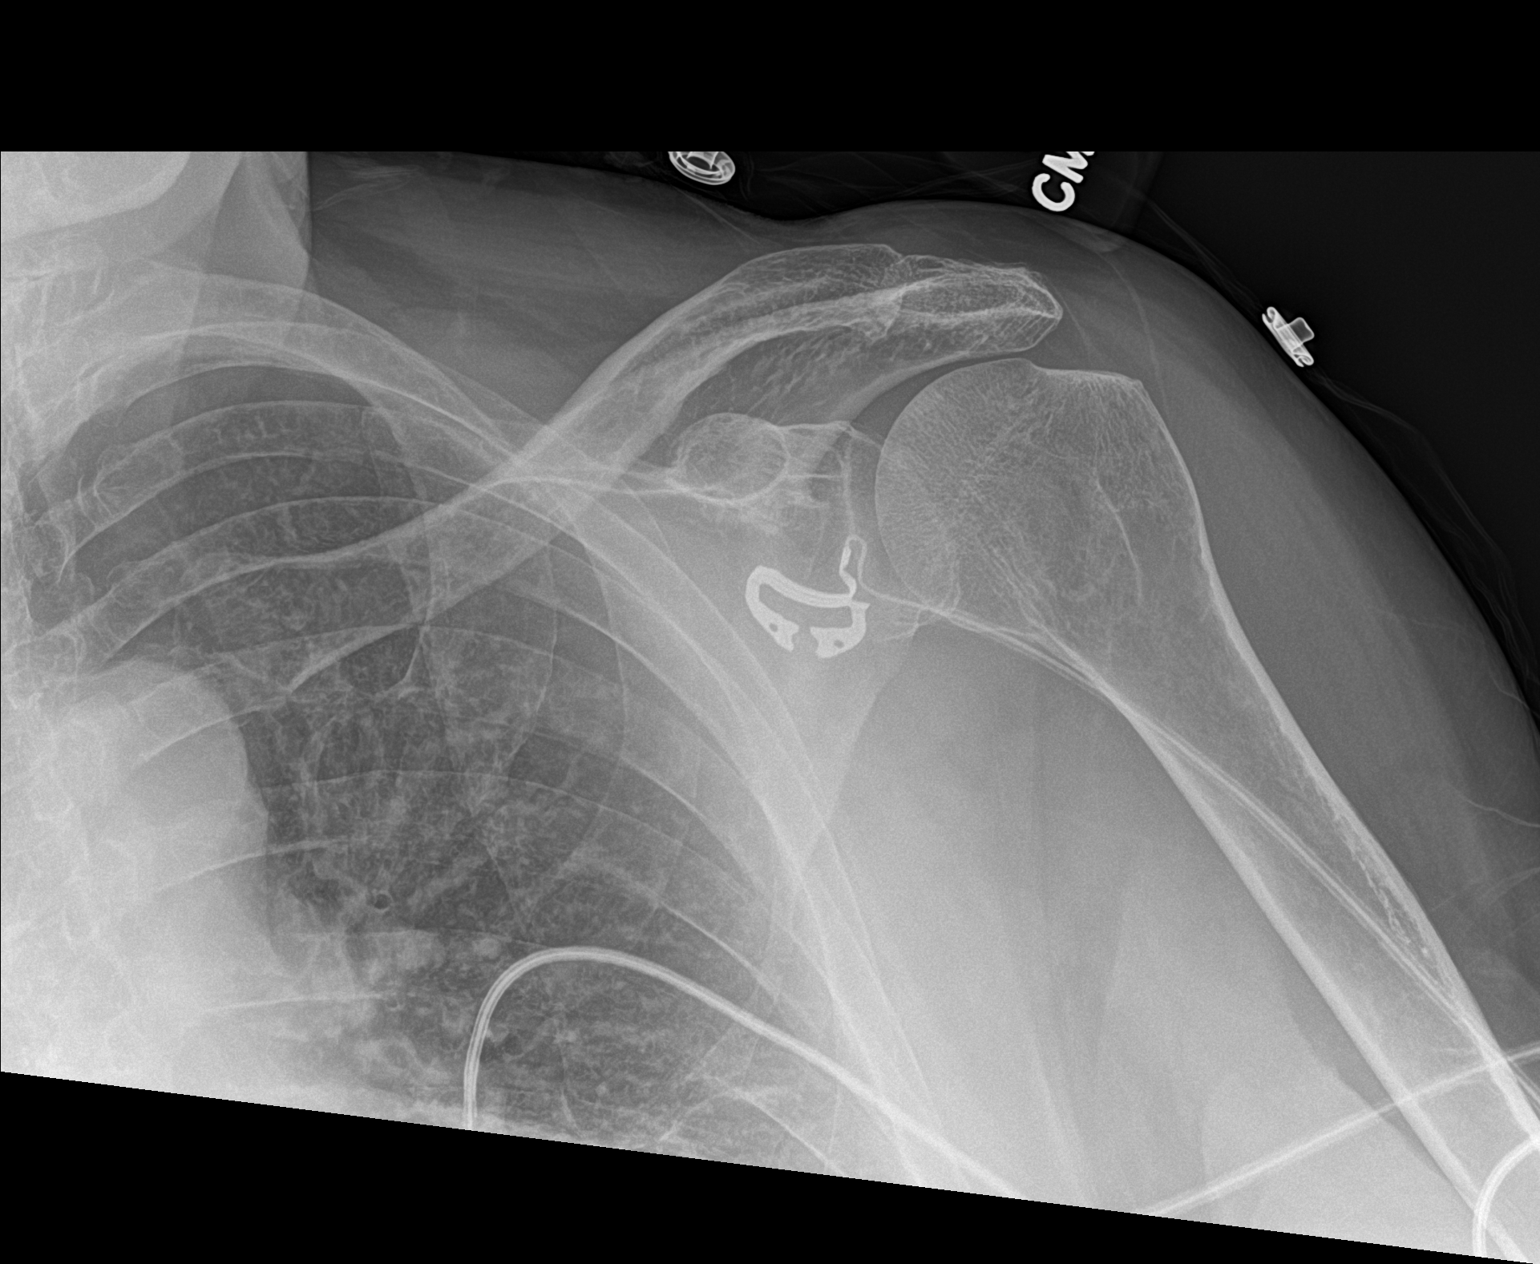

[shoulder obl]
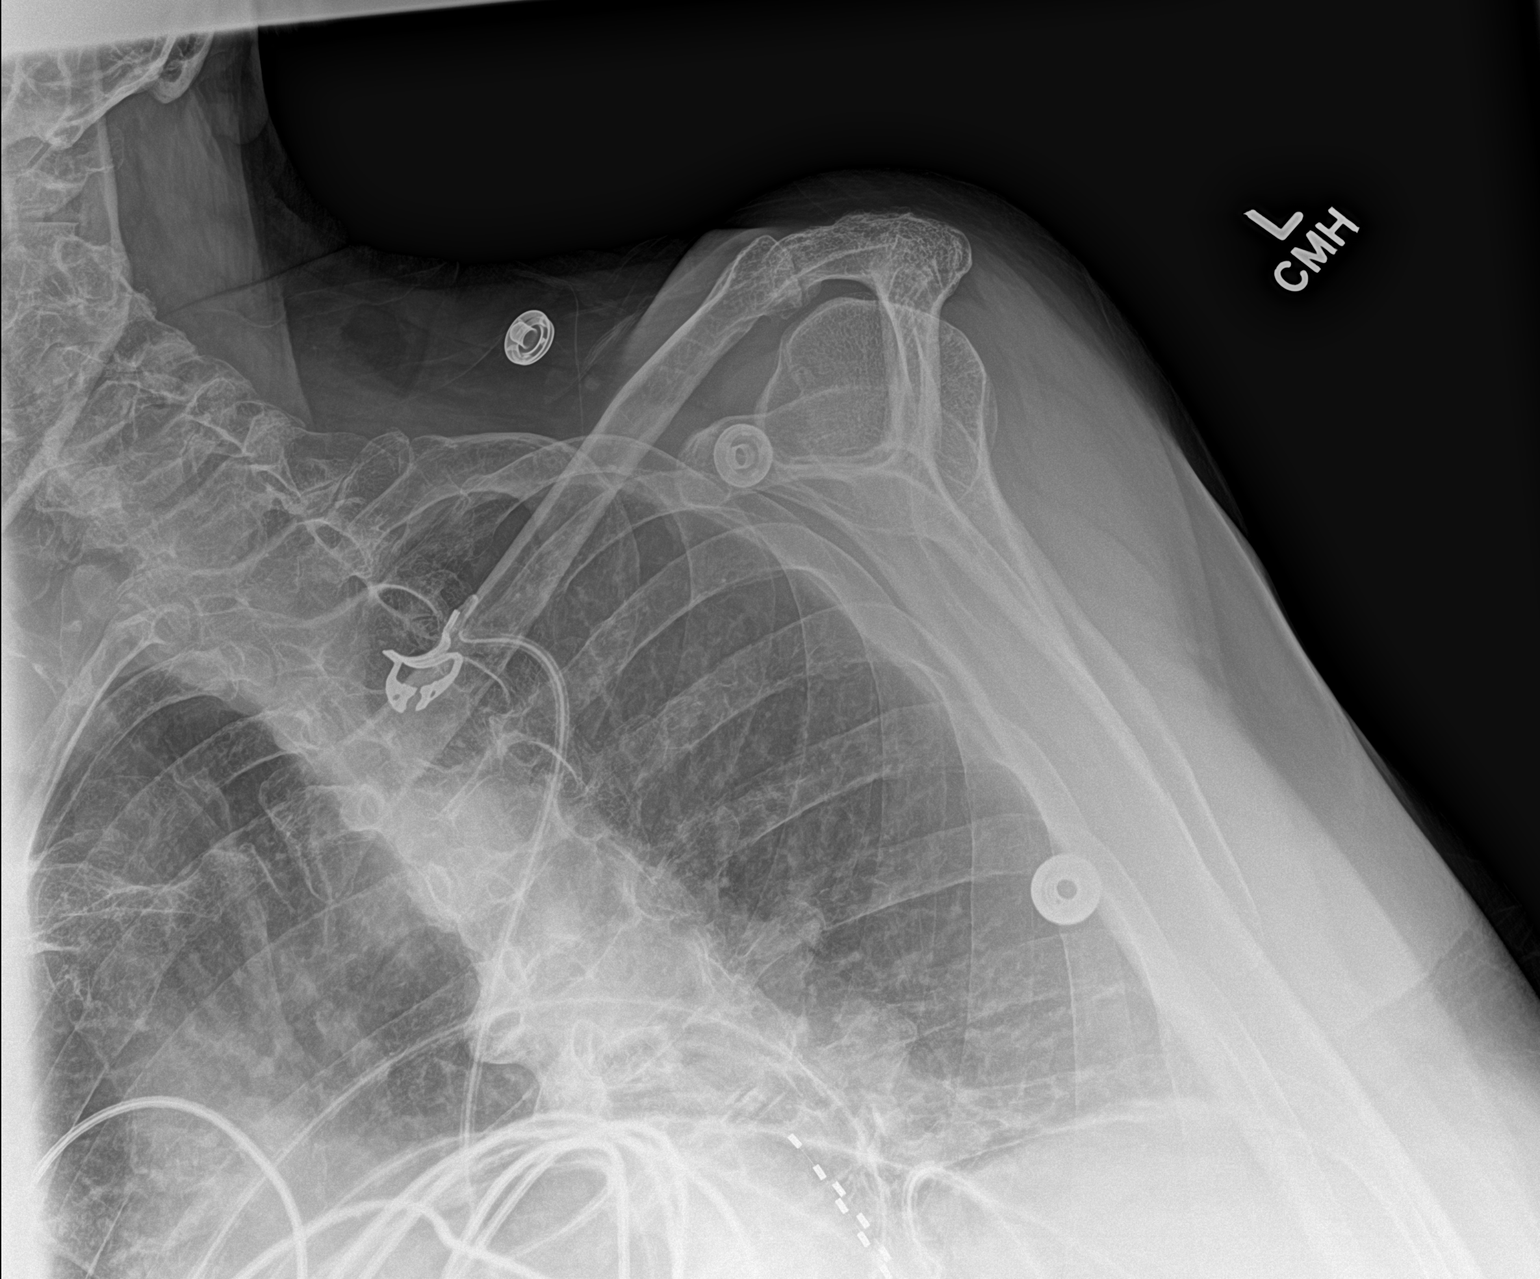

[2 of 2 positions shown; findings below may reference images not displayed]

FINDINGS: There is no evidence of fracture or dislocation. There is no
evidence of arthropathy or other focal bone abnormality. Soft
tissues are unremarkable.
IMPRESSION: Negative.

## 2022-02-19 MED ORDER — KETOROLAC TROMETHAMINE 30 MG/ML IJ SOLN
6.2500 mg | Freq: Once | INTRAMUSCULAR | Status: AC
  Administered 2022-02-19: 6.25 mg via INTRAVENOUS
  Filled 2022-02-19: qty 1

## 2022-02-19 MED ORDER — OXYCODONE-ACETAMINOPHEN 5-325 MG PO TABS
1.0000 | ORAL_TABLET | Freq: Once | ORAL | Status: DC
  Filled 2022-02-19: qty 1

## 2022-02-19 MED ORDER — ONDANSETRON HCL 4 MG/2ML IJ SOLN
4.0000 mg | Freq: Once | INTRAMUSCULAR | Status: AC
  Administered 2022-02-19: 4 mg via INTRAVENOUS
  Filled 2022-02-19: qty 2

## 2022-02-19 NOTE — ED Notes (Signed)
Patient verbalized understanding of discharge instructions.  ?

## 2022-02-19 NOTE — Discharge Instructions (Addendum)
Follow-up with Dr. Romeo Apple for your arm.  You can see the cardiologist here for your chest discomfort ?

## 2022-02-19 NOTE — ED Provider Notes (Signed)
[facility] EMERGENCY DEPARTMENT Provider Note   CSN: [id number] Arrival date & time: 02/19/22  0250     History  Chief Complaint  Patient presents with   Chest Pain    Amanda Mathews is a 76 y.o. female.  Patient presents to the emergency department for evaluation of primarily left arm pain with some discomfort in the chest.  Patient reports that pain began overnight.  She did go to the gym earlier in the day and lift weights, but did not notice any pain after exercising.      Home Medications Prior to Admission medications   Medication Sig Start Date End Date Taking? Authorizing Provider  albuterol (VENTOLIN HFA) 108 (90 Base) MCG/ACT inhaler Inhale 2 puffs into the lungs every 6 (six) hours as needed for wheezing or shortness of breath.    [provider]  buPROPion (WELLBUTRIN XL) 300 MG 24 hr tablet TAKE 1 TABLET BY MOUTH EVERY DAY 02/08/22   Ardith Dark, MD  Calcium Carb-Cholecalciferol (CALCIUM 600-D PO) Take 600 mg by mouth daily.    [provider]  carbamazepine (TEGRETOL) 200 MG tablet Take 200 mg by mouth 2 (two) times daily.    [provider]  Cholecalciferol (VITAMIN D3) 125 MCG (5000 UT) CAPS Take 5,000 Units by mouth daily.    [provider]  denosumab (PROLIA) 60 MG/ML SOSY injection Inject 60 mg into the skin every 6 (six) months.    [provider]  gabapentin (NEURONTIN) 300 MG capsule TAKE 1 CAPSULE BY MOUTH THREE TIMES A DAY Patient taking differently: Twice a day 06/29/21   Ardith Dark, MD  hydrochlorothiazide (HYDRODIURIL) 25 MG tablet Take 1 tablet (25 mg total) by mouth daily. 11/12/21   Ardith Dark, MD  levothyroxine (SYNTHROID) 150 MCG tablet TAKE 1 TABLET BY MOUTH DAILY BEFORE BREAKFAST. 11/07/21   Ardith Dark, MD  MAGNESIUM PO Take 1 tablet by mouth 2 (two) times daily.    [provider]  meloxicam (MOBIC) 7.5 MG tablet TAKE 1 TABLET BY MOUTH EVERY DAY 01/23/22   Ardith Dark,  MD  methocarbamol (ROBAXIN) 750 MG tablet TAKE 1 TABLET (750 MG TOTAL) BY MOUTH 2 (TWO) TIMES DAILY AS NEEDED FOR MUSCLE SPASMS 02/18/22   Ardith Dark, MD  NON FORMULARY Bi-pap machine Patient not taking: Reported on 11/29/2021    [provider]  nystatin cream (MYCOSTATIN) nystatin 100,000 unit/gram topical cream    [provider]  potassium chloride SA (KLOR-CON) 20 MEQ tablet Take 20 mEq by mouth daily.    [provider]  simvastatin (ZOCOR) 20 MG tablet Take 1 tablet (20 mg total) by mouth at bedtime. 06/11/21   Ardith Dark, MD      Allergies    Codeine, Hydromorphone, Morphine, and Ropinirole    Review of Systems   Review of Systems  Musculoskeletal:  Positive for myalgias.   Physical Exam Updated Vital Signs BP 120/76    Pulse 84    Temp 98.2 F (36.8 C)    Resp 17    Ht 5' (1.524 m)    Wt 83 kg    SpO2 98%    BMI 35.74 kg/m  Physical Exam Vitals and nursing note reviewed.  Constitutional:      General: She is not in acute distress.    Appearance: She is well-developed.  HENT:     Head: Normocephalic and atraumatic.     Mouth/Throat:     Mouth:  Mucous membranes are moist.  Eyes:     General: Vision grossly intact. Gaze aligned appropriately.     Extraocular Movements: Extraocular movements intact.     Conjunctiva/sclera: Conjunctivae normal.  Cardiovascular:     Rate and Rhythm: Normal rate and regular rhythm.     Pulses: Normal pulses.     Heart sounds: Normal heart sounds, S1 normal and S2 normal. No murmur heard.   No friction rub. No gallop.  Pulmonary:     Effort: Pulmonary effort is normal. No respiratory distress.     Breath sounds: Normal breath sounds.  Abdominal:     General: Bowel sounds are normal.     Palpations: Abdomen is soft.     Tenderness: There is no abdominal tenderness. There is no guarding or rebound.     Hernia: No hernia is present.  Musculoskeletal:        General: No swelling.     Left shoulder:  Decreased range of motion.     Left upper arm: Tenderness present.     Cervical back: Full passive range of motion without pain, normal range of motion and neck supple. No spinous process tenderness or muscular tenderness. Normal range of motion.     Right lower leg: No edema.     Left lower leg: No edema.  Skin:    General: Skin is warm and dry.     Capillary Refill: Capillary refill takes less than 2 seconds.     Findings: No ecchymosis, erythema, rash or wound.  Neurological:     General: No focal deficit present.     Mental Status: She is alert and oriented to person, place, and time.     GCS: GCS eye subscore is 4. GCS verbal subscore is 5. GCS motor subscore is 6.     Cranial Nerves: Cranial nerves 2-12 are intact.     Sensory: Sensation is intact.     Motor: Motor function is intact.     Coordination: Coordination is intact.  Psychiatric:        Attention and Perception: Attention normal.        Mood and Affect: Mood normal.        Speech: Speech normal.        Behavior: Behavior normal.    ED Results / Procedures / Treatments   Labs (all labs ordered are listed, but only abnormal results are displayed) Labs Reviewed  CBC WITH DIFFERENTIAL/PLATELET - Abnormal; Notable for the following components:      Result Value   Neutro Abs 7.8 (*)    All other components within normal limits  COMPREHENSIVE METABOLIC PANEL - Abnormal; Notable for the following components:   Potassium 3.4 (*)    Glucose, Bld 116 (*)    All other components within normal limits  LIPASE, BLOOD  TROPONIN I (HIGH SENSITIVITY)  TROPONIN I (HIGH SENSITIVITY)    EKG EKG Interpretation  Date/Time:  Tuesday February 19 2022 03:04:07 EST Ventricular Rate:  92 PR Interval:  220 QRS Duration: 114 QT Interval:  383 QTC Calculation: 474 R Axis:   -62 Text Interpretation: Sinus rhythm Prolonged PR interval Consider left atrial enlargement Left anterior fascicular block Anteroseptal infarct, age  indeterminate Lateral leads are also involved Confirmed by Gilda Crease [id number]) on 02/19/2022 8:14:10 AM  Radiology DG Chest Port 1 View  Result Date: 02/19/2022 CLINICAL DATA:  76 year old female with history of chest pain and arm pain. EXAM: PORTABLE CHEST 1 VIEW COMPARISON:  No priors. FINDINGS:  Lung volumes are low. No consolidative airspace disease. No pleural effusions. No pneumothorax. No pulmonary nodule or mass noted. Pulmonary vasculature and the cardiomediastinal silhouette are within normal limits. Spinal cord stimulator projecting over the lower thoracic region. Old healed fractures of the posterolateral aspect of the right fourth rib and lateral right sixth rib incidentally noted. IMPRESSION: 1. Low lung volumes without radiographic evidence of acute cardiopulmonary disease. Electronically Signed   By: Trudie Reed M.D.   On: 02/19/2022 05:32   DG Shoulder Left  Result Date: 02/19/2022 CLINICAL DATA:  76 year old female with history of arm pain. EXAM: LEFT SHOULDER - 2+ VIEW COMPARISON:  No priors. FINDINGS: There is no evidence of fracture or dislocation. There is no evidence of arthropathy or other focal bone abnormality. Soft tissues are unremarkable. IMPRESSION: Negative. Electronically Signed   By: Trudie Reed M.D.   On: 02/19/2022 05:32    Procedures Procedures    Medications Ordered in ED Medications  oxyCODONE-acetaminophen (PERCOCET/ROXICET) 5-325 MG per tablet 1 tablet (1 tablet Oral Patient Refused/Not Given 02/19/22 0738)  ondansetron (ZOFRAN) injection 4 mg (4 mg Intravenous Given 02/19/22 0739)  ketorolac (TORADOL) 30 MG/ML injection 6.25 mg (6.25 mg Intravenous Given 02/19/22 4680)    ED Course/ Medical Decision Making/ A&P                           Medical Decision Making Amount and/or Complexity of Data Reviewed Labs: ordered. Radiology: ordered.  Risk Prescription drug management.   Presents to the emergency department for evaluation of chest  and arm pain.  Patient's pain is primarily in the left upper arm.  She cannot raise her arm above her head because of severe pain in the arm with movement.  The area is tender to the touch as well.  No swelling, no deformity of the bicep muscle.  Patient has been lifting weights and working out, this is most consistent with musculoskeletal pain.  EKG without ischemic changes.  Symptoms are very atypical for cardiac etiology.  First troponin negative.  Second troponin pending.  Will sign out to oncoming ER physician.  If second troponin is negative, patient will be appropriate for discharge, analgesia for musculoskeletal pain and follow-up with primary care.        Final Clinical Impression(s) / ED Diagnoses Final diagnoses:  Atypical chest pain  Biceps strain, left, initial encounter    Rx / DC Orders ED Discharge Orders     None         Lutisha Knoche, Canary Brim, MD 02/19/22 [phone]

## 2022-02-19 NOTE — ED Triage Notes (Signed)
Pt to ED from home c/o left arm and chest pain described as tightness. No cardiac hx reported. Accompanied by sister. Ambulatory.  ?

## 2022-02-22 DIAGNOSIS — I10 Essential (primary) hypertension: Secondary | ICD-10-CM | POA: Diagnosis not present

## 2022-02-22 DIAGNOSIS — G500 Trigeminal neuralgia: Secondary | ICD-10-CM | POA: Diagnosis not present

## 2022-02-22 DIAGNOSIS — Z6835 Body mass index (BMI) 35.0-35.9, adult: Secondary | ICD-10-CM | POA: Diagnosis not present

## 2022-02-23 ENCOUNTER — Other Ambulatory Visit: Payer: Self-pay | Admitting: Family Medicine

## 2022-03-04 ENCOUNTER — Encounter: Payer: Self-pay | Admitting: Orthopedic Surgery

## 2022-03-04 ENCOUNTER — Other Ambulatory Visit: Payer: Self-pay

## 2022-03-04 ENCOUNTER — Ambulatory Visit: Payer: Medicare PPO | Admitting: Orthopedic Surgery

## 2022-03-04 VITALS — BP 145/64 | HR 73 | Ht 60.0 in | Wt 183.4 lb

## 2022-03-04 DIAGNOSIS — M778 Other enthesopathies, not elsewhere classified: Secondary | ICD-10-CM | POA: Diagnosis not present

## 2022-03-04 NOTE — Progress Notes (Signed)
Chief Complaint  ?Patient presents with  ? Arm Pain  ?  LEFT bicep/ patient injured it exercising. States it is feeling better. Still tender from shoulder to elbow  ? ?History this is a 76 year old female seen in the emergency room on March 7 after performing some upper extremity exercises and going home and developing left arm and shoulder pain.  She had a cardiac work-up which was negative ? ?Patient went to the gym that day was normal do the exercise and then noticed some pain at night ? ?This is subsequently almost completely resolved ? ?Examination reveals a healthy-appearing well-developed well-nourished female awake alert and oriented x3 mood and affect normal ? ?Normal passive range of motion of the left shoulder in all planes ? ?Nearly normal active motion ? ?No acute issues noted on the shoulder ? ?Probably tendinitis in the left shoulder ? ?Recommend 2 weeks of rest and then gradual return to normal activities including upper extremity shoulder exercises ? ? ?Encounter Diagnosis  ?Name Primary?  ? Tendinitis of left shoulder Yes  ? ? ? ?

## 2022-03-05 ENCOUNTER — Ambulatory Visit (INDEPENDENT_AMBULATORY_CARE_PROVIDER_SITE_OTHER): Payer: Medicare PPO | Admitting: Family Medicine

## 2022-03-05 ENCOUNTER — Encounter: Payer: Self-pay | Admitting: Family Medicine

## 2022-03-05 VITALS — BP 116/74 | HR 65 | Temp 97.1°F | Ht 60.0 in | Wt 181.6 lb

## 2022-03-05 DIAGNOSIS — I10 Essential (primary) hypertension: Secondary | ICD-10-CM | POA: Diagnosis not present

## 2022-03-05 DIAGNOSIS — F325 Major depressive disorder, single episode, in full remission: Secondary | ICD-10-CM | POA: Diagnosis not present

## 2022-03-05 DIAGNOSIS — E785 Hyperlipidemia, unspecified: Secondary | ICD-10-CM

## 2022-03-05 MED ORDER — HYDROCHLOROTHIAZIDE 25 MG PO TABS: 12.5000 mg | ORAL_TABLET | Freq: Every day | ORAL | 3 refills | Status: DC

## 2022-03-05 NOTE — Assessment & Plan Note (Addendum)
Well-controlled.  She is having some urinary frequency issues.  We will decrease dose of HCTZ to 12.5 mg daily.  She will check in with me in a few weeks via MyChart. ?

## 2022-03-05 NOTE — Progress Notes (Signed)
? ?  Amanda Mathews is a 76 y.o. female who presents today for an office visit. ? ?Assessment/Plan:  ?Chronic Problems Addressed Today: ?Depression, major, single episode, complete remission (HCC) ?She is doing well.  She is having increased tremors on Wellbutrin.  PHQ score 0.  She would like to stop medication.  Think this is reasonable.  Her depression has been well controlled for several years.  We will wean off over the next few weeks and she will follow-up with me in a few weeks via MyChart to let me know how her mood is doing as well as her tremors.  If tremors persist despite stopping Wellbutrin may need to be referred to neurology to look for other possible causes. ? ?Dyslipidemia ?Last LDL 106.  Continue simvastatin 20 mg daily. ? ?Essential hypertension ?Well-controlled.  She is having some urinary frequency issues.  We will decrease dose of HCTZ to 12.5 mg daily.  She will check in with me in a few weeks via MyChart. ? ? ?  ?Subjective:  ?HPI: ? ?Patient here to 6 months follow up.   ? ?She has been doing well since last visit. She is still having issue with jaw tremors. Started about a month ago.  This could also be due to her depression medication. She is currently on Wellbutrin 300 mg daily. She has been taking Wellbutrin for the past few years due to depression relating to her chronic knee pain. She has not tried any other medication. No SI and HI.  ? ?She has had issue with fall in the past. She was referred to physical therapy, This has helped. She denies any fall since then. She is down about 25 pounds down. She has been exercising everyday 5 times a week. She has been doing well otherwise.  ? ?She is taking HCTZ 25 mg daily for her blood pressure. She has not been checking her blood pressure at home. Denies any nausea or vomiting.  ? ?   ?  ?Objective:  ?Physical Exam: ?BP 116/74 (BP Location: Right Arm)   Pulse 65   Temp (!) 97.1 ?F (36.2 ?C) (Temporal)   Ht 5' (1.524 m)   Wt 181 lb 9.6  oz (82.4 kg)   SpO2 97%   BMI 35.47 kg/m?   ?Gen: No acute distress, resting comfortably ?CV: Regular rate and rhythm with no murmurs appreciated ?Pulm: Normal work of breathing, clear to auscultation bilaterally with no crackles, wheezes, or rhonchi ?Neuro: Grossly normal, moves all extremities.  Tremor noted in bilateral hands and jaw. CN 2-12 intact.  ?Psych: Normal affect and thought content ? ?   ? ? ?I,Savera Zaman,acting as a Neurosurgeon for Jacquiline Doe, MD.,have documented all relevant documentation on the behalf of Jacquiline Doe, MD,as directed by  Jacquiline Doe, MD while in the presence of Jacquiline Doe, MD.  ? ?I, Jacquiline Doe, MD, have reviewed all documentation for this visit. The documentation on 03/05/22 for the exam, diagnosis, procedures, and orders are all accurate and complete. ? ?Katina Degree. [name], MD ?03/05/2022 10:38 AM  ? ?

## 2022-03-05 NOTE — Patient Instructions (Signed)
It was very nice to see you today! ? ?Please take your Wellbutrin every other day for the next 1 to 2 weeks and then stop. ? ?Please decrease your HCTZ to 12.5 mg daily.  This is half of your current dose. ? ?Please send a message in a few weeks to let me know how you are doing.  I will see back in 6 months.  Please come back sooner if needed. ? ?Take care, ?Dr Jerline Pain ? ?PLEASE NOTE: ? ?If you had any lab tests please let us know if you have not heard back within a few days. You may see your results on mychart before we have a chance to review them but we will give you a call once they are reviewed by [place]. If we ordered any referrals today, please let us know if you have not heard from their office within the next week.  ? ?Please try these tips to maintain a healthy lifestyle: ? ?Eat at least 3 REAL meals and 1-2 snacks per day.  Aim for no more than 5 hours between eating.  If you eat breakfast, please do so within one hour of getting up.  ? ?Each meal should contain half fruits/vegetables, one quarter protein, and one quarter carbs (no bigger than a computer mouse) ? ?Cut down on sweet beverages. This includes juice, soda, and sweet tea.  ? ?Drink at least 1 glass of water with each meal and aim for at least 8 glasses per day ? ?Exercise at least 150 minutes every week.   ?

## 2022-03-05 NOTE — Assessment & Plan Note (Signed)
Last LDL 106.  Continue simvastatin 20 mg daily. ?

## 2022-03-05 NOTE — Assessment & Plan Note (Signed)
She is doing well.  She is having increased tremors on Wellbutrin.  PHQ score 0.  She would like to stop medication.  Think this is reasonable.  Her depression has been well controlled for several years.  We will wean off over the next few weeks and she will follow-up with me in a few weeks via MyChart to let me know how her mood is doing as well as her tremors.  If tremors persist despite stopping Wellbutrin may need to be referred to neurology to look for other possible causes. ?

## 2022-03-07 ENCOUNTER — Telehealth: Payer: Self-pay | Admitting: Family Medicine

## 2022-03-07 NOTE — Telephone Encounter (Signed)
Pt states she was told to call back with information regarding her last shingles vax from previous primary care location.  ? ?Hingrix was administered on April 01, 2018.  ? ?Please call back if you need more information. ? ?[facility] ? ?

## 2022-03-07 NOTE — Telephone Encounter (Signed)
Immunizations are current and up to date.  ?

## 2022-03-14 DIAGNOSIS — H524 Presbyopia: Secondary | ICD-10-CM | POA: Diagnosis not present

## 2022-03-14 DIAGNOSIS — Z961 Presence of intraocular lens: Secondary | ICD-10-CM | POA: Diagnosis not present

## 2022-03-14 DIAGNOSIS — H52203 Unspecified astigmatism, bilateral: Secondary | ICD-10-CM | POA: Diagnosis not present

## 2022-03-14 DIAGNOSIS — H5213 Myopia, bilateral: Secondary | ICD-10-CM | POA: Diagnosis not present

## 2022-03-18 ENCOUNTER — Ambulatory Visit: Payer: Medicare PPO | Admitting: Family Medicine

## 2022-03-28 ENCOUNTER — Other Ambulatory Visit: Payer: Self-pay | Admitting: Family Medicine

## 2022-03-29 ENCOUNTER — Telehealth: Payer: Self-pay

## 2022-03-29 NOTE — Telephone Encounter (Signed)
Last Prolia inj 10/10/21 ?Next Prolia inj due 04/11/22 ? ?PA# [id number] ?12/16/21-/12/15/22 ?

## 2022-03-31 ENCOUNTER — Other Ambulatory Visit: Payer: Self-pay | Admitting: Family Medicine

## 2022-04-01 ENCOUNTER — Ambulatory Visit: Payer: Medicare PPO | Admitting: Cardiology

## 2022-04-01 NOTE — Progress Notes (Deleted)
? ? ? ?Clinical Summary ?Ms. Duley is a 76 y.o.female ? ?1.Chest pain ?- seen in ER 02/19/22 with chest pain and left arm pain ?- had gone to gym day before to lift weights ?- in ER could not raise arm above head due to pain.  ?Past Medical History:  ?Diagnosis Date  ? Cataract   ? Osteoporosis   ? Sleep apnea   ? Thyroid disease   ? ? ? ?Allergies  ?Allergen Reactions  ? Codeine Nausea And Vomiting  ? Hydromorphone Nausea Only  ? Morphine Nausea Only and Other (See Comments)  ?  Hallucinations   ? Ropinirole   ?  hallucinations  ? ? ? ?Current Outpatient Medications  ?Medication Sig Dispense Refill  ? albuterol (VENTOLIN HFA) 108 (90 Base) MCG/ACT inhaler Inhale 2 puffs into the lungs every 6 (six) hours as needed for wheezing or shortness of breath. (Patient not taking: Reported on 03/05/2022)    ? Calcium Carb-Cholecalciferol (CALCIUM 600-D PO) Take 600 mg by mouth daily.    ? carbamazepine (TEGRETOL) 200 MG tablet Take 200 mg by mouth 2 (two) times daily.    ? Cholecalciferol (VITAMIN D3) 125 MCG (5000 UT) CAPS Take 5,000 Units by mouth daily.    ? denosumab (PROLIA) 60 MG/ML SOSY injection Inject 60 mg into the skin every 6 (six) months.    ? gabapentin (NEURONTIN) 300 MG capsule TAKE 1 CAPSULE BY MOUTH THREE TIMES A DAY (Patient taking differently: Twice a day) 90 capsule 5  ? hydrochlorothiazide (HYDRODIURIL) 25 MG tablet Take 0.5 tablets (12.5 mg total) by mouth daily. 90 tablet 3  ? levothyroxine (SYNTHROID) 150 MCG tablet TAKE 1 TABLET BY MOUTH DAILY BEFORE BREAKFAST. 90 tablet 1  ? MAGNESIUM PO Take 1 tablet by mouth 2 (two) times daily.    ? meloxicam (MOBIC) 7.5 MG tablet TAKE 1 TABLET BY MOUTH EVERY DAY 30 tablet 0  ? methocarbamol (ROBAXIN) 750 MG tablet TAKE 1 TABLET (750 MG TOTAL) BY MOUTH 2 (TWO) TIMES DAILY AS NEEDED FOR MUSCLE SPASMS 60 tablet 0  ? NON FORMULARY Bi-pap machine    ? nystatin cream (MYCOSTATIN) nystatin 100,000 unit/gram topical cream    ? potassium chloride SA (KLOR-CON) 20 MEQ  tablet Take 20 mEq by mouth daily.    ? simvastatin (ZOCOR) 20 MG tablet Take 1 tablet (20 mg total) by mouth at bedtime. 90 tablet 3  ? ?No current facility-administered medications for this visit.  ? ? ? ?Past Surgical History:  ?Procedure Laterality Date  ? back stimulator     ? BUNIONECTOMY    ? CHOLECYSTECTOMY    ? FEMUR FRACTURE SURGERY    ? REPLACEMENT TOTAL KNEE BILATERAL    ? ? ? ?Allergies  ?Allergen Reactions  ? Codeine Nausea And Vomiting  ? Hydromorphone Nausea Only  ? Morphine Nausea Only and Other (See Comments)  ?  Hallucinations   ? Ropinirole   ?  hallucinations  ? ? ? ? ?Family History  ?Problem Relation Age of Onset  ? Hypertension Mother   ? Stroke Mother   ? Arthritis Sister   ? COPD Sister   ? Depression Sister   ? Diabetes Sister   ? Alcohol abuse Brother   ? Hypertension Sister   ? ? ? ?Social History ?Ms. Chio reports that she has never smoked. She has never used smokeless tobacco. ?Ms. Bogosian reports no history of alcohol use. ? ? ?Review of Systems ?CONSTITUTIONAL: No weight loss, fever, chills, weakness or  fatigue.  ?HEENT: Eyes: No visual loss, blurred vision, double vision or yellow sclerae.No hearing loss, sneezing, congestion, runny nose or sore throat.  ?SKIN: No rash or itching.  ?CARDIOVASCULAR:  ?RESPIRATORY: No shortness of breath, cough or sputum.  ?GASTROINTESTINAL: No anorexia, nausea, vomiting or diarrhea. No abdominal pain or blood.  ?GENITOURINARY: No burning on urination, no polyuria ?NEUROLOGICAL: No headache, dizziness, syncope, paralysis, ataxia, numbness or tingling in the extremities. No change in bowel or bladder control.  ?MUSCULOSKELETAL: No muscle, back pain, joint pain or stiffness.  ?LYMPHATICS: No enlarged nodes. No history of splenectomy.  ?PSYCHIATRIC: No history of depression or anxiety.  ?ENDOCRINOLOGIC: No reports of sweating, cold or heat intolerance. No polyuria or polydipsia.  ?. ? ? ?Physical Examination ?There were no vitals filed for this  visit. ?There were no vitals filed for this visit. ? ?Gen: resting comfortably, no acute distress ?HEENT: no scleral icterus, pupils equal round and reactive, no palptable cervical adenopathy,  ?CV ?Resp: Clear to auscultation bilaterally ?GI: abdomen is soft, non-tender, non-distended, normal bowel sounds, no hepatosplenomegaly ?MSK: extremities are warm, no edema.  ?Skin: warm, no rash ?Neuro:  no focal deficits ?Psych: appropriate affect ? ? ?Diagnostic Studies ? ? ? ? ?Assessment and Plan  ? ? ? ? ? ? ?Antoine Poche, M.D., F.A.C.C. ?

## 2022-04-04 DIAGNOSIS — R82998 Other abnormal findings in urine: Secondary | ICD-10-CM | POA: Diagnosis not present

## 2022-04-04 DIAGNOSIS — R350 Frequency of micturition: Secondary | ICD-10-CM | POA: Diagnosis not present

## 2022-04-04 DIAGNOSIS — R3129 Other microscopic hematuria: Secondary | ICD-10-CM | POA: Diagnosis not present

## 2022-04-04 DIAGNOSIS — R351 Nocturia: Secondary | ICD-10-CM | POA: Diagnosis not present

## 2022-04-04 NOTE — Telephone Encounter (Signed)
Prolia VOB initiated via [email or web address] ? ?Last OV:  ?Next OV:  ?Last Prolia inj: 10/10/21 ?Next Prolia inj DUE: 04/11/22 ? ?

## 2022-04-10 DIAGNOSIS — G500 Trigeminal neuralgia: Secondary | ICD-10-CM | POA: Diagnosis not present

## 2022-04-17 ENCOUNTER — Other Ambulatory Visit (HOSPITAL_COMMUNITY): Payer: Self-pay | Admitting: Neurological Surgery

## 2022-04-17 ENCOUNTER — Other Ambulatory Visit: Payer: Self-pay | Admitting: Neurological Surgery

## 2022-04-23 ENCOUNTER — Other Ambulatory Visit: Payer: Self-pay | Admitting: Neurological Surgery

## 2022-04-23 ENCOUNTER — Other Ambulatory Visit (HOSPITAL_COMMUNITY): Payer: Self-pay | Admitting: Neurological Surgery

## 2022-04-23 DIAGNOSIS — G500 Trigeminal neuralgia: Secondary | ICD-10-CM

## 2022-04-24 ENCOUNTER — Ambulatory Visit (HOSPITAL_COMMUNITY)
Admission: RE | Admit: 2022-04-24 | Discharge: 2022-04-24 | Disposition: A | Discharge status: Home / Self Care | Payer: Medicare PPO | Source: Ambulatory Visit | Attending: Neurological Surgery | Admitting: Neurological Surgery

## 2022-04-24 DIAGNOSIS — R519 Headache, unspecified: Secondary | ICD-10-CM | POA: Diagnosis not present

## 2022-04-24 DIAGNOSIS — G500 Trigeminal neuralgia: Secondary | ICD-10-CM | POA: Diagnosis not present

## 2022-04-24 IMAGING — MR MR HEAD WO/W CM
11 of 14 series · 29 of 48 positions shown · IV contrast (gadavist)
Comparison: None Available.

CLINICAL DATA: Trigeminal neuralgia; pain left side of face

EXAM:
MRI HEAD WITHOUT AND WITH CONTRAST
TECHNIQUE: Multiplanar, multiecho pulse sequences of the brain and surrounding
structures were obtained without and with intravenous contrast.
CONTRAST:  8mL GADAVIST GADOBUTROL 1 MMOL/ML IV SOLN

[Series 5: DWI · axial · 3.0mm · 1.09mm/px · z∈[-57,+108]mm · 6 of 112 slices shown (1 of 4)]
[im 1/112]
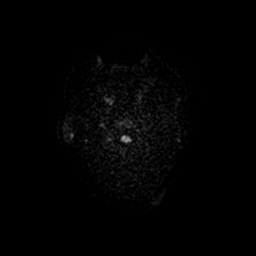
[im 23/112]
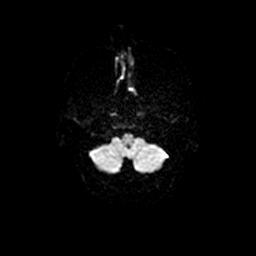
[im 45/112]
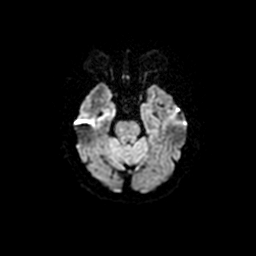
[im 67/112]
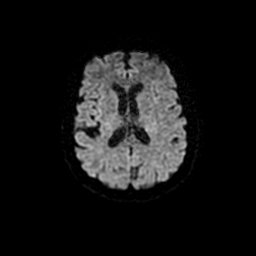
[im 89/112]
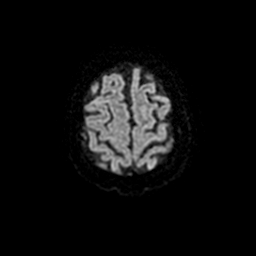
[im 112/112]
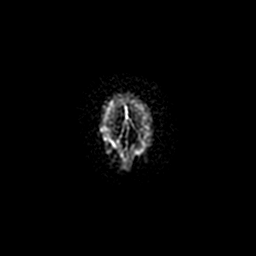

[Series 6: DWI · coronal · 5.0mm · 1.09mm/px · 4 of 76 slices shown (2 of 4)]
[im 1/76]
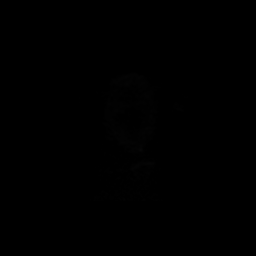
[im 26/76]
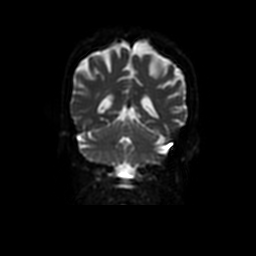
[im 51/76]
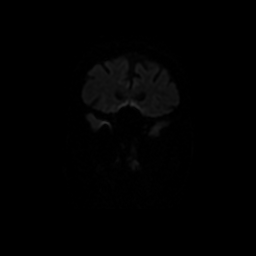
[im 76/76]
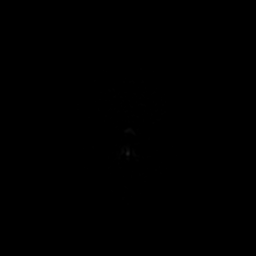

[Series 7: T2 · axial · 5.0mm · 0.43mm/px · 1 of 25 slices shown]
[im 1/25]
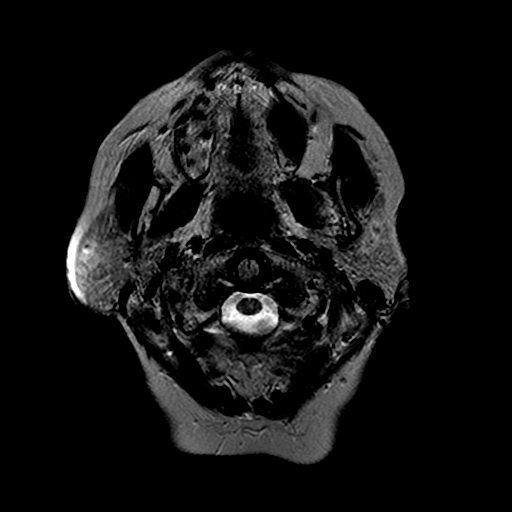

[Series 8: T1 · sagittal · 6.0mm · 0.49mm/px · 1 of 19 slices shown]
[im 1/19]
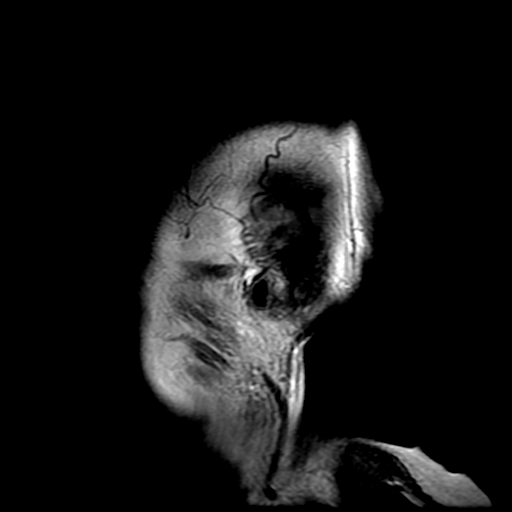

[Series 9: FLAIR · axial · 3.0mm · 0.45mm/px · 1 of 28 slices shown]
[im 1/28]
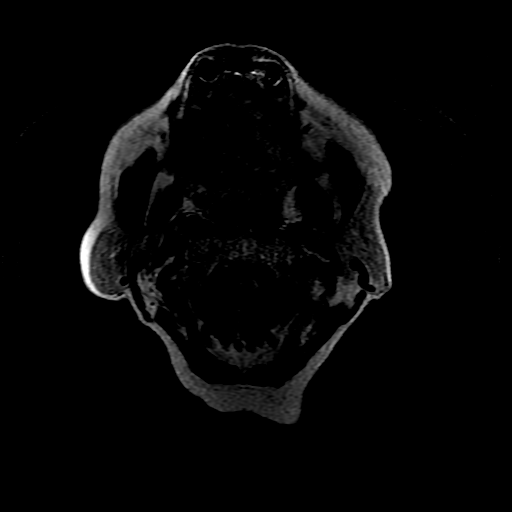

[Series 11: ax mpgr · axial · 5.0mm · 0.45mm/px · 1 of 24 slices shown]
[im 1/24]
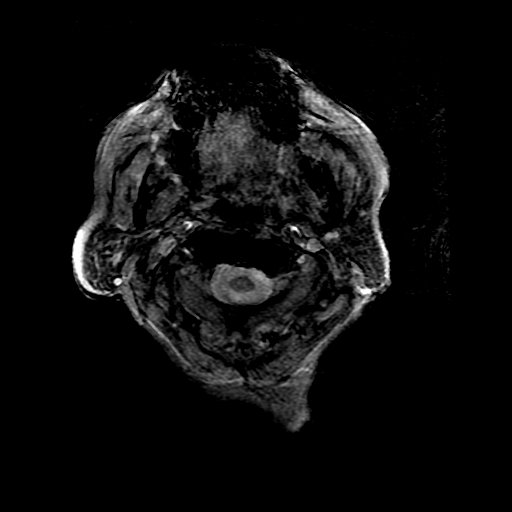

[Series 500: DWI · axial · 3.0mm · 1.09mm/px · z∈[-57,+108]mm · 3 of 56 slices shown (3 of 4)]
[im 1/56]
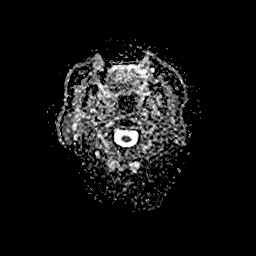
[im 28/56]
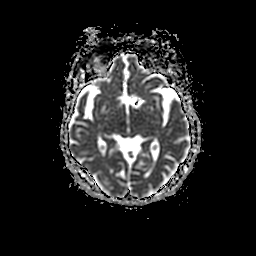
[im 56/56]
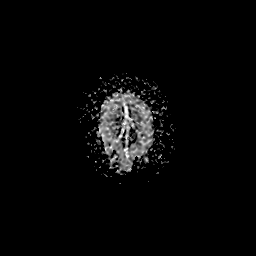

[Series 600: DWI · coronal · 5.0mm · 1.09mm/px · 2 of 38 slices shown (4 of 4)]
[im 1/38]
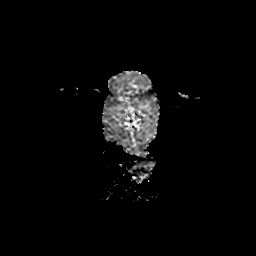
[im 38/38]
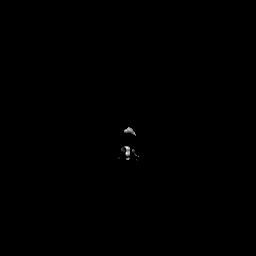

[Series 1000: coronal reformat · axial · 1.0mm · 0.47mm/px · z∈[+17,+117]mm · 3 of 70 slices shown (1 of 2)]
[im 1/70]
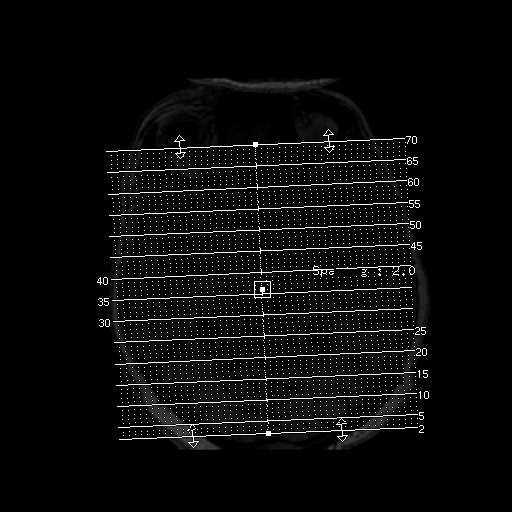
[im 35/70]
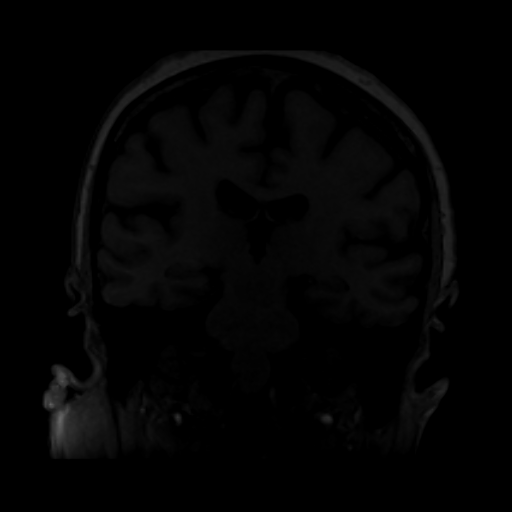
[im 70/70]
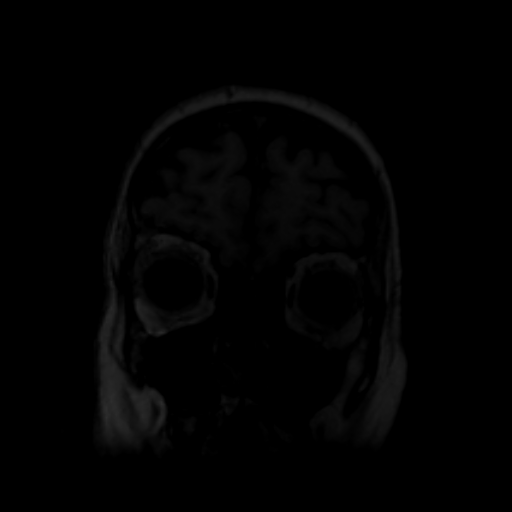

[Series 1001: sag reformat · axial · 1.0mm · 0.47mm/px · z∈[+17,+127]mm · 6 of 126 slices shown]
[im 1/126]
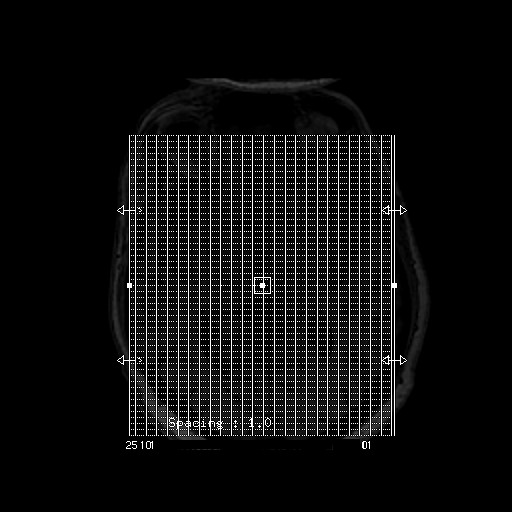
[im 26/126]
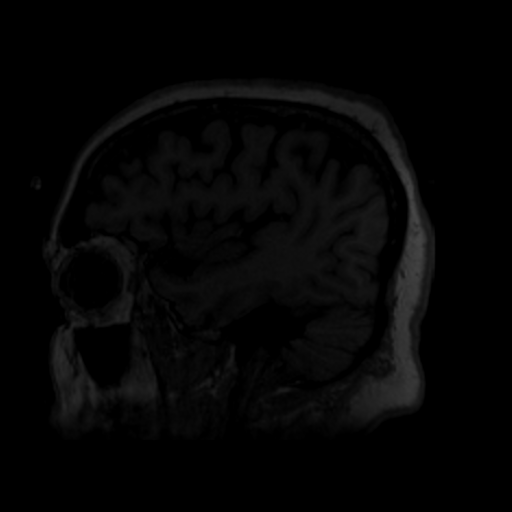
[im 51/126]
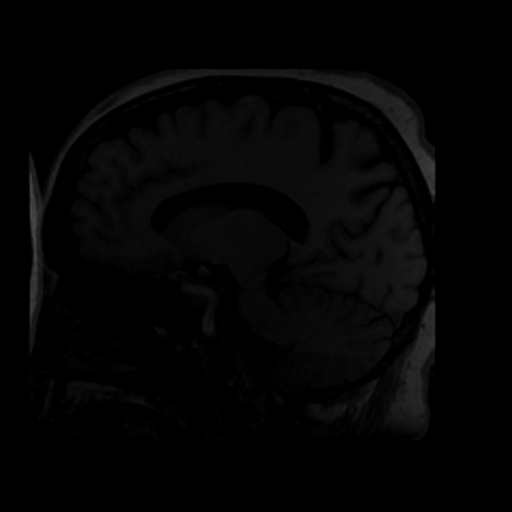
[im 76/126]
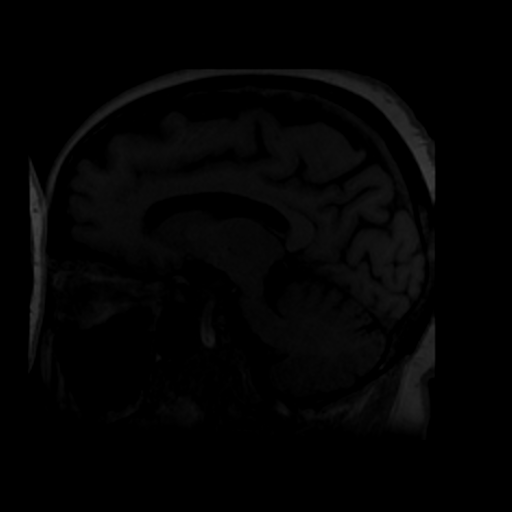
[im 101/126]
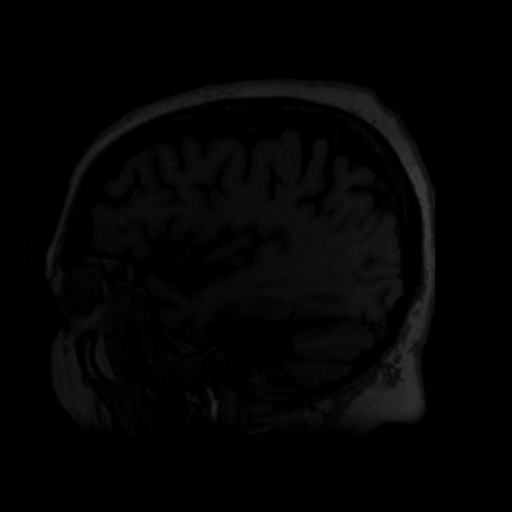
[im 126/126]
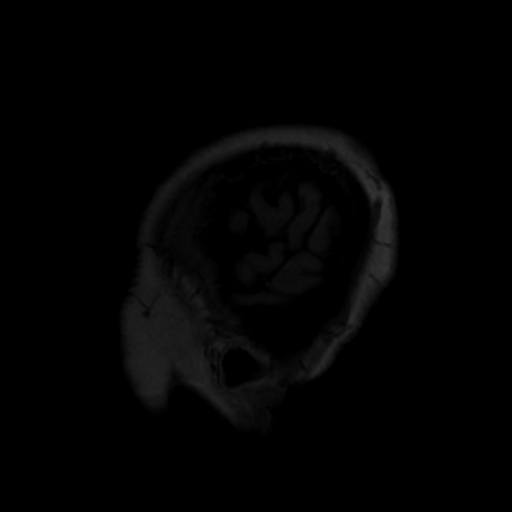

[Series 1200: coronal reformat · axial · 1.0mm · 0.47mm/px · 1 of 70 slices shown (2 of 2)]
[im 1/70]
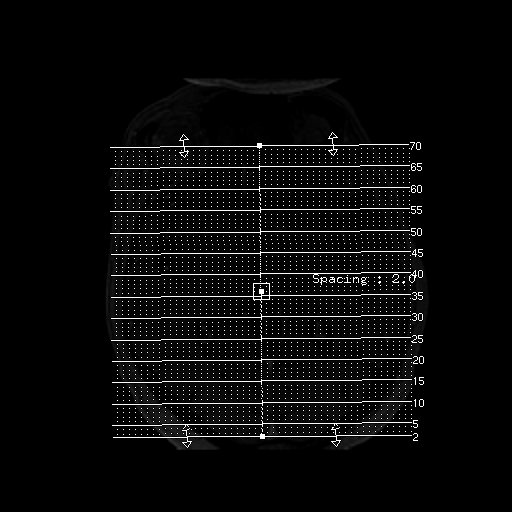

[29 of 48 positions shown; findings below may reference images not displayed]

FINDINGS: Brain: There is no acute infarction or intracranial hemorrhage.
There is no intracranial mass, mass effect, or edema. There is no
hydrocephalus or extra-axial fluid collection. Prominence of the
ventricles and sulci reflects parenchymal volume loss patchy foci of
T2 hyperintensity in the supratentorial white matter are nonspecific
but may reflect mild chronic microvascular ischemic changes. No
abnormal enhancement.

Vascular: Major vessel flow voids at the skull base are preserved.

Skull and upper cervical spine: Non-expansile appearing T2
hyperintensity at the left petrous apex likely reflects trapped
fluid within in area of pneumatization. Normal marrow signal is
preserved.

Sinuses/Orbits: Paranasal sinuses are aerated. Bilateral lens
replacements.

Other: Sella is expanded and "empty."  Mastoid air cells are clear.
IMPRESSION: Study was performed using a standard brain protocol.

No mass or abnormal enhancement along the course of the left
trigeminal nerve.

Mild chronic microvascular ischemic changes.

## 2022-04-24 MED ORDER — GADOBUTROL 1 MMOL/ML IV SOLN
8.0000 mL | Freq: Once | INTRAVENOUS | Status: AC | PRN
  Administered 2022-04-24: 8 mL via INTRAVENOUS

## 2022-04-25 DIAGNOSIS — G500 Trigeminal neuralgia: Secondary | ICD-10-CM | POA: Diagnosis not present

## 2022-04-29 ENCOUNTER — Encounter: Payer: Self-pay | Admitting: Family Medicine

## 2022-04-29 ENCOUNTER — Encounter: Payer: Self-pay | Admitting: Neurology

## 2022-04-29 ENCOUNTER — Ambulatory Visit: Payer: Medicare PPO | Admitting: Family Medicine

## 2022-04-29 ENCOUNTER — Other Ambulatory Visit: Payer: Self-pay | Admitting: Family Medicine

## 2022-04-29 VITALS — BP 135/78 | HR 61 | Temp 98.2°F | Ht 60.0 in | Wt 179.2 lb

## 2022-04-29 DIAGNOSIS — Z1159 Encounter for screening for other viral diseases: Secondary | ICD-10-CM

## 2022-04-29 DIAGNOSIS — G8929 Other chronic pain: Secondary | ICD-10-CM | POA: Diagnosis not present

## 2022-04-29 DIAGNOSIS — Z23 Encounter for immunization: Secondary | ICD-10-CM

## 2022-04-29 DIAGNOSIS — I10 Essential (primary) hypertension: Secondary | ICD-10-CM | POA: Diagnosis not present

## 2022-04-29 DIAGNOSIS — M545 Low back pain, unspecified: Secondary | ICD-10-CM

## 2022-04-29 DIAGNOSIS — G249 Dystonia, unspecified: Secondary | ICD-10-CM

## 2022-04-29 DIAGNOSIS — F325 Major depressive disorder, single episode, in full remission: Secondary | ICD-10-CM | POA: Diagnosis not present

## 2022-04-29 NOTE — Assessment & Plan Note (Signed)
Possibly essential tremor.  Unlikely to be medication side effect though still possible.  We discussed evaluation options.  They would like to have further evaluation.  We will place referral to neurology. ?

## 2022-04-29 NOTE — Assessment & Plan Note (Signed)
Doing well off Wellbutrin.  Mood has been well controlled. ?

## 2022-04-29 NOTE — Assessment & Plan Note (Signed)
No red flags.  She follows with pain management and neurosurgery. ?

## 2022-04-29 NOTE — Patient Instructions (Signed)
It was very nice to see you today! ? ?We will refer you to neurology. ? ?We will check hepatitis C test today and give your pneumonia vaccine. ? ?No other changes today. ? ?Take care, ?Dr [name] ? ?PLEASE NOTE: ? ?If you had any lab tests please let us know if you have not heard back within a few days. You may see your results on mychart before we have a chance to review them but we will give you a call once they are reviewed by [place]. If we ordered any referrals today, please let us know if you have not heard from their office within the next week.  ? ?Please try these tips to maintain a healthy lifestyle: ? ?Eat at least 3 REAL meals and 1-2 snacks per day.  Aim for no more than 5 hours between eating.  If you eat breakfast, please do so within one hour of getting up.  ? ?Each meal should contain half fruits/vegetables, one quarter protein, and one quarter carbs (no bigger than a computer mouse) ? ?Cut down on sweet beverages. This includes juice, soda, and sweet tea.  ? ?Drink at least 1 glass of water with each meal and aim for at least 8 glasses per day ? ?Exercise at least 150 minutes every week.   ?

## 2022-04-29 NOTE — Assessment & Plan Note (Signed)
At goal today on HCTZ 12.5 mg daily.  Follow-up again in 6 to 12 months. ?

## 2022-04-29 NOTE — Progress Notes (Signed)
? ?  Amanda Mathews is a 76 y.o. female who presents today for an office visit. ? ?Assessment/Plan:  ?Chronic Problems Addressed Today: ?Dystonia ?Possibly essential tremor.  Unlikely to be medication side effect though still possible.  We discussed evaluation options.  They would like to have further evaluation.  We will place referral to neurology. ? ?Depression, major, single episode, complete remission (HCC) ?Doing well off Wellbutrin.  Mood has been well controlled. ? ?Essential hypertension ?At goal today on HCTZ 12.5 mg daily.  Follow-up again in 6 to 12 months. ? ?Chronic low back pain ?No red flags.  She follows with pain management and neurosurgery. ? ?Preventative health care ?Pneumovax 23 given today.  Will check hep C antibody. ? ?  ?Subjective:  ?HPI: ? ?Patient her for follow up. She was last seen a couple of months ago. At our last visit she was having issues with tremor in her jaw.  She was concerned it may be due to a side effect of Wellbutrin.  We will stop her Wellbutrin at her last office visit however her tremor has persisted.  Comes and goes.  Is not particularly bothersome though she is somewhat embarrassed by it.  No difficulty with speaking or swallowing.  She saw her neurosurgeon a month ago and she was diagnosed with trigeminal neuralgia.  She did have a MRI done of her brain few weeks ago which did not show any significant abnormalities. ? ?   ?  ?Objective:  ?Physical Exam: ?BP 135/78   Pulse 61   Temp 98.2 ?F (36.8 ?C) (Temporal)   Ht 5' (1.524 m)   Wt 179 lb 3.2 oz (81.3 kg)   SpO2 96%   BMI 35.00 kg/m?   ?Gen: No acute distress, resting comfortably ?CV: Regular rate and rhythm with no murmurs appreciated ?Pulm: Normal work of breathing, clear to auscultation bilaterally with no crackles, wheezes, or rhonchi ?Neuro: Regular rhythmic tremor of mandible noted.  Cranial nerves II through XII otherwise intact.  Strength out of 5 in upper and lower extremities.  Sensation light  touch grossly intact throughout. ?Psych: Normal affect and thought content ? ?   ? ?Katina Degree. [name], MD ?04/29/2022 10:23 AM  ?

## 2022-04-30 ENCOUNTER — Other Ambulatory Visit: Payer: Self-pay | Admitting: Family Medicine

## 2022-04-30 LAB — HEPATITIS C ANTIBODY
Hepatitis C Ab: NONREACTIVE
SIGNAL TO CUT-OFF: 0.06 (ref <1.00)

## 2022-05-01 NOTE — Telephone Encounter (Signed)
Reaching out to [place] to expedite VOB ? ? ?

## 2022-05-01 NOTE — Progress Notes (Signed)
Assessment/Plan:    Tremor Patient reports lifelong history of longstanding essential tremor.  The newer jaw tremor may just be a manifestation of this. Sometimes, jaw tremor can be a manifestation of Parkinson's disease.  However, she has no other features of Parkinson's disease. Patient is on quite a bit of carbamazepine.  Going to go ahead and check that level just to make sure we are not missing something fair in terms of toxicity. No evidence of dystonia Gait instability with hyperreflexia Likely multifactorial.  She has a spinal cord stimulator in for low back pain. Despite the above, however, this would not explain her diffuse hyperreflexia.  Her MRI of the brain was unremarkable. Going to go ahead and do an MRI of the cervical spine just make sure we are not missing anything. Trigeminal neuralgia Tx by Dr. Danielle Dess    Subjective:   Amanda Mathews was seen today in the movement disorders clinic for neurologic consultation at the request of Ardith Dark, MD.  She is seen with her twin sister who supplements history.  The consultation is for the evaluation of "dystonia."  Medical records made available to me are reviewed.  Patient was seen by primary care at the end of March and was complaining about jaw tremor, felt due to Wellbutrin.  Her Wellbutrin was weaned off at the end of March because of jaw tremor.  She followed back up on May 15.  She continued to have the issue.  She was told that it was dystonia, possibly essential tremor, and was sent here for follow-up.  She is also being seen by Dr. Danielle Dess for pain in the left side of the face.  I reviewed his April notes.  She has been treated for trigeminal neuralgia with carbamazepine, 200 mg 4 times per day.  She was last seen on April 26 and told to increase the Tegretol to 200 mg 5 times per day and increase gabapentin to 300 mg 3 times per day.  He also ordered an MRI of the brain. They were going to increase to 6 times per day  but pt states that they decided not to after some labs. Pt states that face pain has been going on a year.  Jaw tremor has been going on for maybe a year or so, around the same time she was dx with TN.  Pt doesn't notice it until she looks in mirror.  Her sisters husband was first to point it out.  She has tremor in both hands most of her life.  She has an upper bridge on the top teeth only and it is cemented in.  The lower front teeth are her natural teeth.    Balance isn't good.  She hasn't fallen in last 6 months.  She was seen by primary care in December for several falls.  With 1 fall she was getting out of the car and lost balance and fell onto her right side.  A few days later she slipped on her deck and fell.  With 1 fall, she tripped over asphalt and fell.  She states that all of those falls were related to her shoes being too large.  She has not fallen since.  Voice is good.  No new numbness or tingling.      Neuroimaging of the brain has  previously been performed.  It is available for my review today.  she had an MRI brain on Apr 24, 2022.  There was very few T2 hyperintensities/mild  white matter disease.   ALLERGIES:   Allergies  Allergen Reactions   Codeine Nausea And Vomiting   Hydromorphone Nausea Only   Morphine Nausea Only and Other (See Comments)    Hallucinations    Ropinirole     hallucinations    CURRENT MEDICATIONS:  Current Outpatient Medications  Medication Instructions   Calcium Carb-Cholecalciferol (CALCIUM 600-D PO) 600 mg, Oral, Daily   carbamazepine (TEGRETOL) 200 mg, Oral, 2 times daily, 5 times daily 200 mg   denosumab (PROLIA) 60 mg, Subcutaneous, Every 6 months   gabapentin (NEURONTIN) 300 MG capsule TAKE 1 CAPSULE BY MOUTH THREE TIMES A DAY   hydrochlorothiazide (HYDRODIURIL) 12.5 mg, Oral, Daily   levothyroxine (SYNTHROID) 150 MCG tablet TAKE 1 TABLET BY MOUTH DAILY BEFORE BREAKFAST.   MAGNESIUM PO 1 tablet, Oral, 2 times daily, 250 mg   meloxicam  (MOBIC) 7.5 MG tablet TAKE 1 TABLET BY MOUTH EVERY DAY   methocarbamol (ROBAXIN) 750 MG tablet TAKE 1 TABLET BY MOUTH TWICE A DAY AS NEEDED FOR MUSCLE SPASMS   nystatin cream (MYCOSTATIN) nystatin 100,000 unit/gram topical cream   potassium chloride SA (KLOR-CON) 20 MEQ tablet 20 mEq, Daily   simvastatin (ZOCOR) 20 mg, Oral, Daily at bedtime   Vitamin D3 5,000 Units, Oral, Daily    Objective:   PHYSICAL EXAMINATION:    VITALS:   Vitals:   05/06/22 0927  BP: 138/77  Pulse: 71  SpO2: 94%  Weight: 178 lb (80.7 kg)  Height: 5' (1.524 m)    GEN:  The patient appears stated age and is in NAD. HEENT:  Normocephalic, atraumatic.  The mucous membranes are moist. The superficial temporal arteries are without ropiness or tenderness. CV:  RRR Lungs:  CTAB Neck/HEME:  There are no carotid bruits bilaterally.  Neurological examination:  Orientation: The patient is alert and oriented x3.  Cranial nerves: There is good facial symmetry.  Extraocular muscles are intact. The visual fields are full to confrontational testing. The speech is fluent and clear. Soft palate rises symmetrically and there is no tongue deviation. Hearing is intact to conversational tone. Sensation: Sensation is intact to light touch throughout (facial, trunk, extremities). Vibration is intact at the bilateral big toe. There is no extinction with double simultaneous stimulation.  Motor: Strength is 5/5 in the bilateral upper and lower extremities.   Shoulder shrug is equal and symmetric.  There is no pronator drift. Deep tendon reflexes: Deep tendon reflexes are 3/4 at the bilateral biceps, triceps, brachioradialis, patella and achilles. Plantar responses are downgoing bilaterally.  Movement examination: Tone: There is nl tone in the bilateral upper extremities.  The tone in the lower extremities is nl.  Abnormal movements: there is jaw tremor.  There is mild postural and intention tremor.  There is no rest tremor of the  upper extremities, even with distraction procedures. Coordination:  There is no decremation with RAM's, with any form of RAMS, including alternating supination and pronation of the forearm, hand opening and closing, finger taps, heel taps and toe taps. Gait and Station: The patient pushes off to arise.  Her gait is antalgic and somewhat unstable even with a walker.  I have reviewed and interpreted the following labs independently   Chemistry      Component Value Date/Time   NA 141 02/19/2022 0605   NA 147 06/08/2020 0000   K 3.4 (L) 02/19/2022 0605   CL 104 02/19/2022 0605   CO2 28 02/19/2022 0605   BUN 20 02/19/2022 9767  BUN 20 06/08/2020 0000   CREATININE 0.93 02/19/2022 0605   GLU 102 06/08/2020 0000      Component Value Date/Time   CALCIUM 9.0 02/19/2022 0605   ALKPHOS 92 02/19/2022 0605   AST 15 02/19/2022 0605   ALT 14 02/19/2022 0605   BILITOT 0.7 02/19/2022 0605      Lab Results  Component Value Date   TSH 0.95 09/17/2021   Lab Results  Component Value Date   WBC 9.3 02/19/2022   HGB 13.9 02/19/2022   HCT 42.6 02/19/2022   MCV 94.0 02/19/2022   PLT 222 02/19/2022   No results found for: PHENYTOIN, PHENOBARB, VALPROATE, CBMZ    Total time spent on today's visit was 53 minutes, including both face-to-face time and nonface-to-face time.  Time included that spent on review of records (prior notes available to me/labs/imaging if pertinent), discussing treatment and goals, answering patient's questions and coordinating care.  Cc:  Ardith DarkParker, Caleb M, MD

## 2022-05-02 ENCOUNTER — Telehealth: Payer: Self-pay | Admitting: Family Medicine

## 2022-05-02 NOTE — Telephone Encounter (Signed)
Patient calling back for lab results °

## 2022-05-02 NOTE — Progress Notes (Signed)
Please inform patient of the following:  Her hep C test is NORMAL.  Amanda Mathews. [name], MD 05/02/2022 8:40 AM

## 2022-05-03 NOTE — Telephone Encounter (Signed)
Please see lab result note.

## 2022-05-04 NOTE — Telephone Encounter (Addendum)
Pt ready for scheduling on or after 04/11/22  Out-of-pocket cost due at time of visit: $0  Primary: [employer] Prolia co-insurance: 0% Admin fee co-insurance: 0%  Secondary: n/a Prolia co-insurance:  Admin fee co-insurance:   Deductible: does not apply  Prior Auth: APPROVED PA# [id number] 12/16/21-/12/15/22    ** This summary of benefits is an estimation of the patient's out-of-pocket cost. Exact cost may very based on individual plan coverage.

## 2022-05-06 ENCOUNTER — Other Ambulatory Visit (INDEPENDENT_AMBULATORY_CARE_PROVIDER_SITE_OTHER): Payer: Medicare PPO

## 2022-05-06 ENCOUNTER — Ambulatory Visit (INDEPENDENT_AMBULATORY_CARE_PROVIDER_SITE_OTHER): Payer: Medicare PPO

## 2022-05-06 ENCOUNTER — Ambulatory Visit: Payer: Medicare PPO | Admitting: Neurology

## 2022-05-06 ENCOUNTER — Encounter: Payer: Self-pay | Admitting: Neurology

## 2022-05-06 VITALS — BP 138/77 | HR 71 | Ht 60.0 in | Wt 178.0 lb

## 2022-05-06 DIAGNOSIS — M810 Age-related osteoporosis without current pathological fracture: Secondary | ICD-10-CM | POA: Diagnosis not present

## 2022-05-06 DIAGNOSIS — Z5181 Encounter for therapeutic drug level monitoring: Secondary | ICD-10-CM

## 2022-05-06 DIAGNOSIS — R270 Ataxia, unspecified: Secondary | ICD-10-CM

## 2022-05-06 DIAGNOSIS — G959 Disease of spinal cord, unspecified: Secondary | ICD-10-CM | POA: Diagnosis not present

## 2022-05-06 DIAGNOSIS — R251 Tremor, unspecified: Secondary | ICD-10-CM | POA: Diagnosis not present

## 2022-05-06 MED ORDER — DENOSUMAB 60 MG/ML ~~LOC~~ SOSY
60.0000 mg | PREFILLED_SYRINGE | Freq: Once | SUBCUTANEOUS | Status: AC
  Administered 2022-05-06: 60 mg via SUBCUTANEOUS

## 2022-05-06 NOTE — Telephone Encounter (Signed)
Vml for pt to call back and sch - luci

## 2022-05-06 NOTE — Progress Notes (Signed)
Amanda Mathews 76 yr old female presents to office today for 6 month Prolia injection per Jacquiline Doe, MD. Administered DENOSUMAB 60 mg/mL subcutaneous right arm. Patient tolerated well.

## 2022-05-08 ENCOUNTER — Telehealth: Payer: Self-pay

## 2022-05-08 LAB — BASIC METABOLIC PANEL WITH GFR
BUN: 19 mg/dL (ref 7–25)
CO2: 26 mmol/L (ref 20–32)
Calcium: 9.3 mg/dL (ref 8.6–10.4)
Chloride: 106 mmol/L (ref 98–110)
Creat: 0.98 mg/dL (ref 0.60–1.00)
Glucose, Bld: 91 mg/dL (ref 65–99)
Potassium: 3.7 mmol/L (ref 3.5–5.3)
Sodium: 145 mmol/L (ref 135–146)
eGFR: 61 mL/min/{1.73_m2} (ref >=60)

## 2022-05-08 LAB — TEST AUTHORIZATION

## 2022-05-08 LAB — CARBAMAZEPINE LEVEL, TOTAL: Carbamazepine Lvl: 8.7 mg/L (ref 4.0–12.0)

## 2022-05-08 NOTE — Telephone Encounter (Signed)
Need to contact patient and provide her with [facility] central scheduling phone number: [phone] to schedule her MRI cervical spine.

## 2022-05-08 NOTE — Telephone Encounter (Signed)
Pt called and message left with number for her to call to get an appointment for MRI

## 2022-05-08 NOTE — Addendum Note (Signed)
Addended by: Karl Luke A on: 05/08/2022 12:34 PM   Modules accepted: Orders

## 2022-05-17 ENCOUNTER — Other Ambulatory Visit: Payer: Self-pay | Admitting: Family Medicine

## 2022-05-27 ENCOUNTER — Ambulatory Visit (HOSPITAL_COMMUNITY)
Admission: RE | Admit: 2022-05-27 | Discharge: 2022-05-27 | Disposition: A | Discharge status: Home / Self Care | Payer: Medicare PPO | Source: Ambulatory Visit | Attending: Neurology | Admitting: Neurology

## 2022-05-27 ENCOUNTER — Other Ambulatory Visit: Payer: Self-pay | Admitting: Family Medicine

## 2022-05-27 DIAGNOSIS — G959 Disease of spinal cord, unspecified: Secondary | ICD-10-CM | POA: Diagnosis not present

## 2022-05-27 DIAGNOSIS — M4802 Spinal stenosis, cervical region: Secondary | ICD-10-CM | POA: Diagnosis not present

## 2022-05-27 IMAGING — MR MR CERVICAL SPINE W/O CM
6 of 7 series · 33 of 48 positions shown · non-contrast
Comparison: None Available.

CLINICAL DATA: Myelopathy, acute, cervical spine

EXAM:
MRI CERVICAL SPINE WITHOUT CONTRAST
TECHNIQUE: Multiplanar, multisequence MR imaging of the cervical spine was
performed. No intravenous contrast was administered.

[Series 3: T2 · sagittal · 3.0mm · 0.90mm/px · 5 of 12 slices shown (1 of 3)]
[im 1/12]
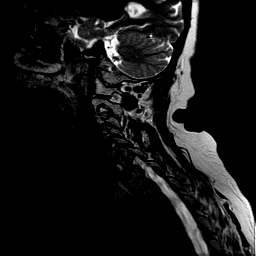
[im 3/12]
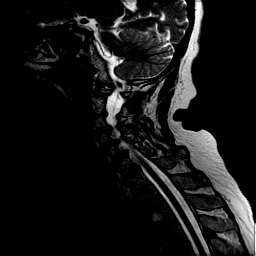
[im 6/12]
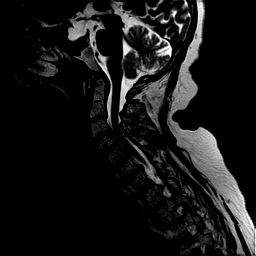
[im 9/12]
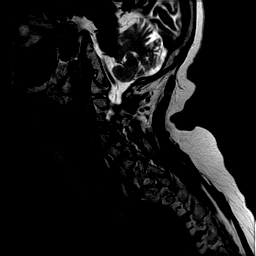
[im 12/12]
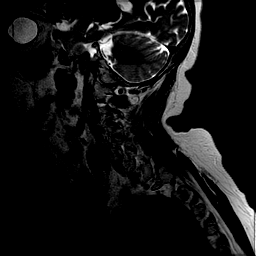

[Series 4: T1 · sagittal · 3.0mm · 0.45mm/px · 5 of 12 slices shown (1 of 2)]
[im 1/12]
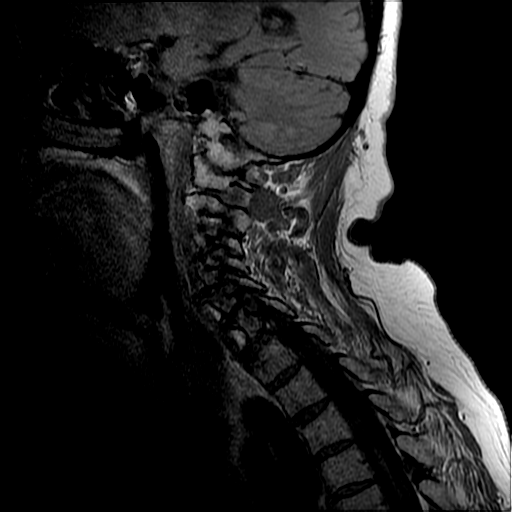
[im 3/12]
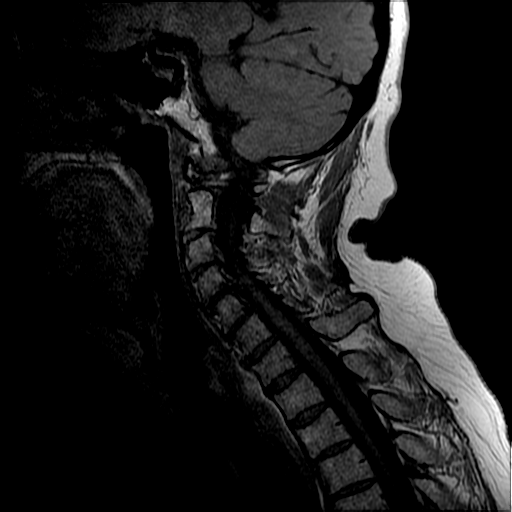
[im 6/12]
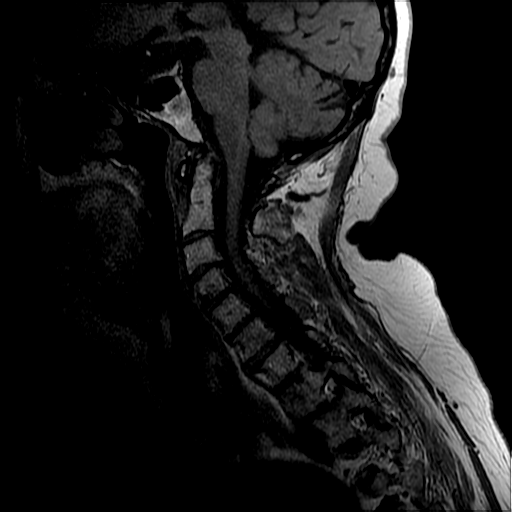
[im 9/12]
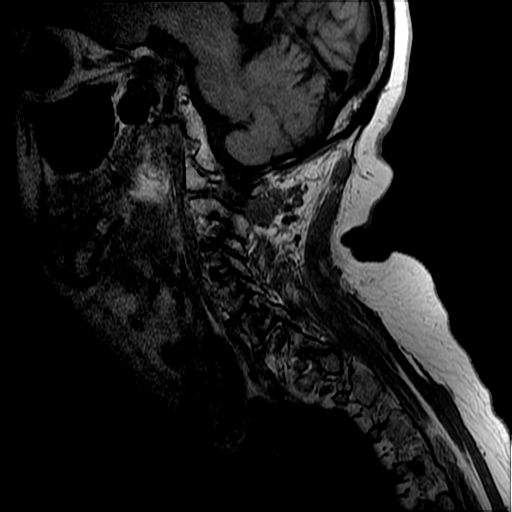
[im 12/12]
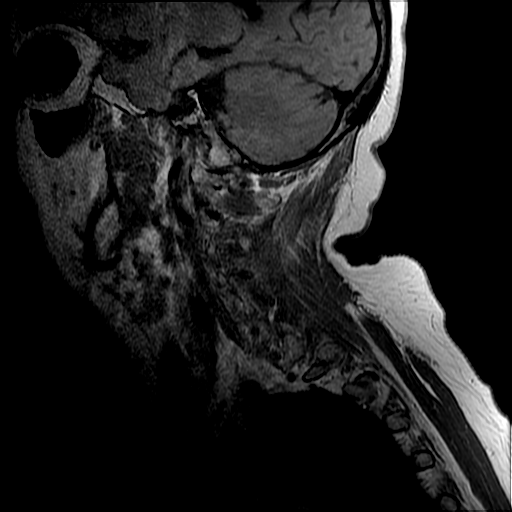

[Series 5: sag ir · sagittal · 3.0mm · 0.45mm/px · 2 of 12 slices shown]
[im 1/12]
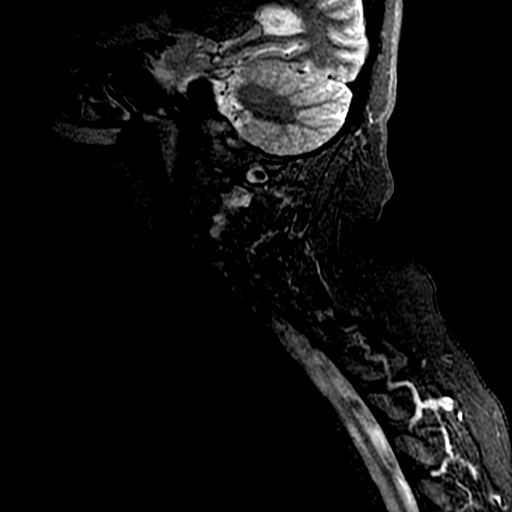
[im 3/12]
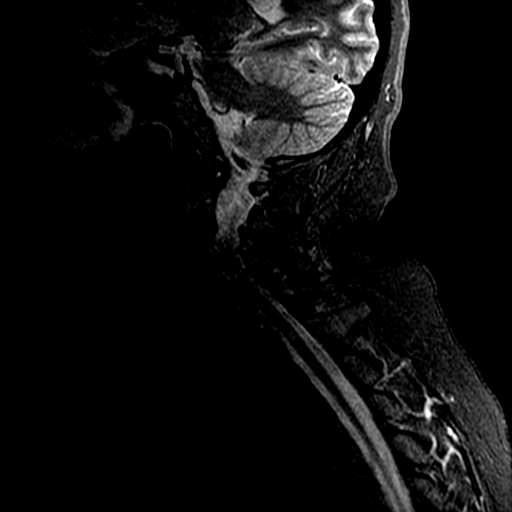

[Series 7: T2 · sagittal · 3.0mm · 0.90mm/px · 5 of 14 slices shown (2 of 3)]
[im 1/14]
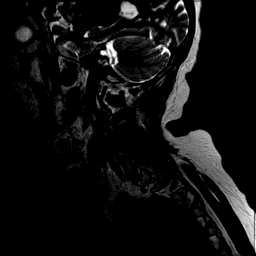
[im 4/14]
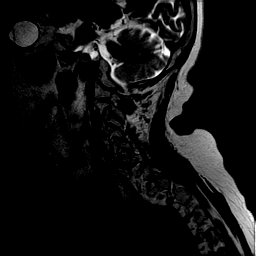
[im 7/14]
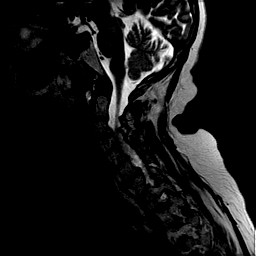
[im 10/14]
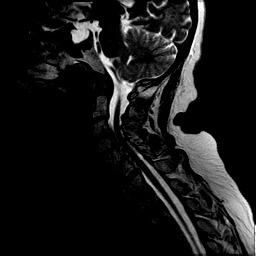
[im 14/14]
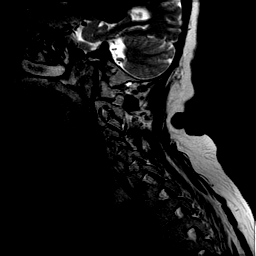

[Series 8: T2 · axial · 3.0mm · 0.78mm/px · z∈[-70,+22]mm · 11 of 30 slices shown (3 of 3)]
[im 1/30]
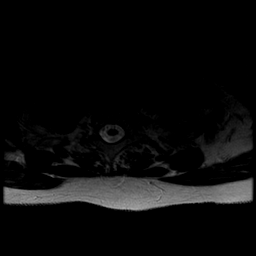
[im 3/30]
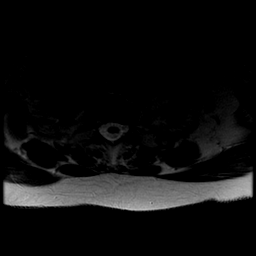
[im 6/30]
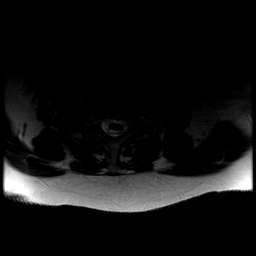
[im 9/30]
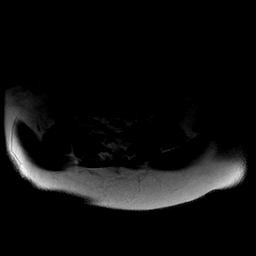
[im 12/30]
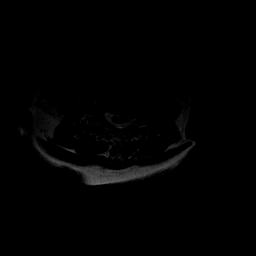
[im 15/30]
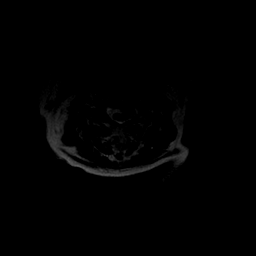
[im 18/30]
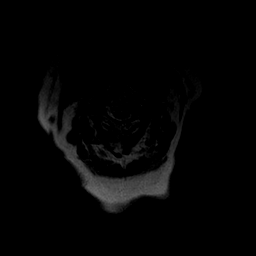
[im 21/30]
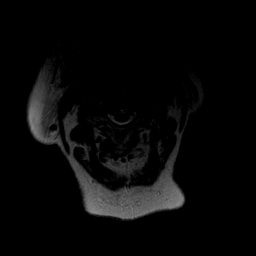
[im 24/30]
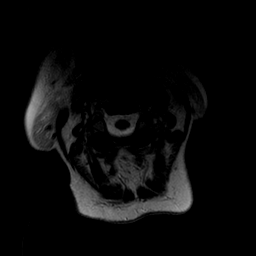
[im 27/30]
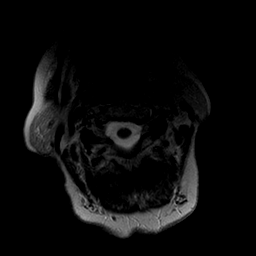
[im 30/30]
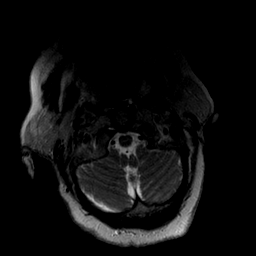

[Series 9: T1 · sagittal · 3.5mm · 0.45mm/px · 5 of 14 slices shown (2 of 2)]
[im 1/14]
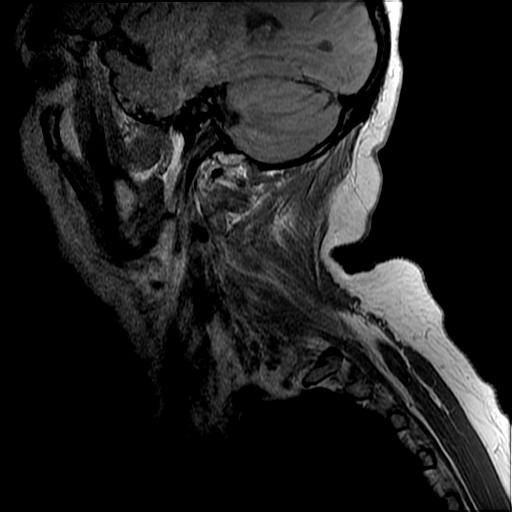
[im 4/14]
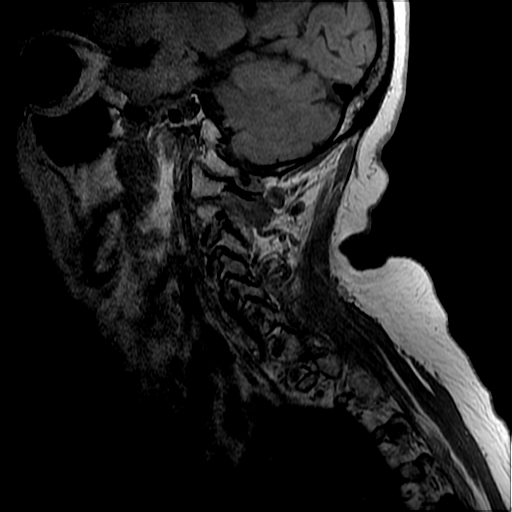
[im 7/14]
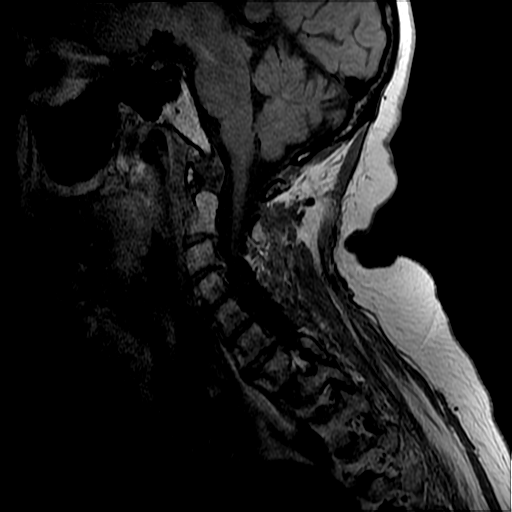
[im 10/14]
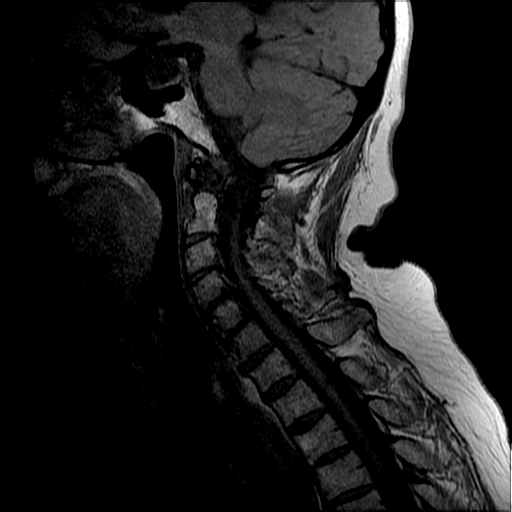
[im 14/14]
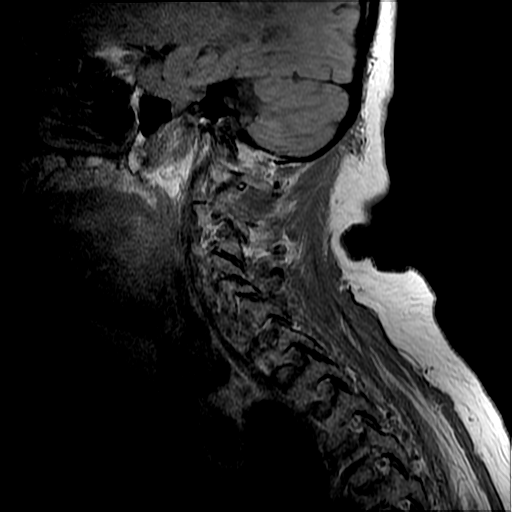

[33 of 48 positions shown; findings below may reference images not displayed]

FINDINGS: Alignment: Grade 1 retrolisthesis at C2-3 and C3-4.

Vertebrae: No fracture, evidence of discitis, or bone lesion.

Cord: Normal signal and morphology.

Posterior Fossa, vertebral arteries, paraspinal tissues: Negative.

Disc levels:

C1-2: Unremarkable.

C2-3: Minimal disc bulge. There is no spinal canal stenosis. No
neural foraminal stenosis.

C3-4: Minimal disc bulge with large right and small left
uncovertebral osteophytes. There is no spinal canal stenosis. Severe
right neural foraminal stenosis.

C4-5: Small disc bulge with uncovertebral hypertrophy. Moderate
spinal canal stenosis. Mild bilateral neural foraminal stenosis.

C5-6: Small disc bulge with uncovertebral hypertrophy. There is no
spinal canal stenosis. Mild left neural foraminal stenosis.

C6-7: Small disc bulge with left-greater-than-right uncovertebral
hypertrophy. There is no spinal canal stenosis. Mild left neural
foraminal stenosis.

C7-T1: Normal disc space and facet joints. There is no spinal canal
stenosis. No neural foraminal stenosis.
IMPRESSION: 1. Moderate spinal canal stenosis at C4-5.
2. Severe right C3-4 neural foraminal stenosis.
3. Mild left neural foraminal stenosis at C5-6 and C6-7.

## 2022-05-28 ENCOUNTER — Other Ambulatory Visit: Payer: Self-pay | Admitting: Family Medicine

## 2022-05-28 ENCOUNTER — Telehealth: Payer: Self-pay | Admitting: Family Medicine

## 2022-05-28 NOTE — Telephone Encounter (Signed)
Pt called in stating she had an MRI on 05/27/22. She states she was told she may need to speak to [facility] regarding her meds. She does not know who advised this or what medications they are referring to. I do not see these notes. Please advise

## 2022-05-29 ENCOUNTER — Telehealth: Payer: Self-pay

## 2022-05-29 ENCOUNTER — Other Ambulatory Visit: Payer: Self-pay | Admitting: Family Medicine

## 2022-05-29 NOTE — Telephone Encounter (Signed)
Pt called an informed that disc protrusion at C4-5, likely contributing to her excessive reflexes.  She also has a pinched nerve going to arm at C3-4 but I don't think she had sx's of that.  Copy faxed  to Dr. Danielle Dess,

## 2022-05-29 NOTE — Telephone Encounter (Signed)
Spoke with patient advise to call Octaviano Batty Tat, DO for more information about MRI  Patient verbalized understanding

## 2022-05-29 NOTE — Telephone Encounter (Signed)
-----   Message from Octaviano Batty Tat, DO sent at 05/29/2022  7:02 AM EDT ----- Let pt know that she has disc protrusion at C4-5, likely contributing to her excessive reflexes.  She also has a pinched nerve going to arm at C3-4 but I don't think she had sx's of that.  Please fax copy to Dr. Danielle Dess, as she is already seeing him and let her know to discuss with him.

## 2022-06-06 DIAGNOSIS — R350 Frequency of micturition: Secondary | ICD-10-CM | POA: Diagnosis not present

## 2022-06-06 DIAGNOSIS — R319 Hematuria, unspecified: Secondary | ICD-10-CM | POA: Diagnosis not present

## 2022-06-06 NOTE — Telephone Encounter (Signed)
Last Prolia in 05/06/22 Next Prolia inj due 11/07/22

## 2022-06-14 DIAGNOSIS — G959 Disease of spinal cord, unspecified: Secondary | ICD-10-CM | POA: Diagnosis not present

## 2022-06-14 DIAGNOSIS — Z6834 Body mass index (BMI) 34.0-34.9, adult: Secondary | ICD-10-CM | POA: Diagnosis not present

## 2022-06-14 DIAGNOSIS — G500 Trigeminal neuralgia: Secondary | ICD-10-CM | POA: Diagnosis not present

## 2022-06-21 DIAGNOSIS — M6281 Muscle weakness (generalized): Secondary | ICD-10-CM | POA: Diagnosis not present

## 2022-06-21 DIAGNOSIS — R350 Frequency of micturition: Secondary | ICD-10-CM | POA: Diagnosis not present

## 2022-06-25 ENCOUNTER — Other Ambulatory Visit: Payer: Self-pay | Admitting: Family Medicine

## 2022-06-26 ENCOUNTER — Other Ambulatory Visit: Payer: Self-pay | Admitting: Family Medicine

## 2022-07-10 ENCOUNTER — Other Ambulatory Visit: Payer: Self-pay | Admitting: *Deleted

## 2022-07-10 ENCOUNTER — Encounter: Payer: Self-pay | Admitting: Family Medicine

## 2022-07-10 ENCOUNTER — Ambulatory Visit: Payer: Medicare PPO | Admitting: Family Medicine

## 2022-07-10 VITALS — BP 118/80 | HR 82 | Temp 98.7°F | Ht 60.0 in | Wt 175.4 lb

## 2022-07-10 DIAGNOSIS — J029 Acute pharyngitis, unspecified: Secondary | ICD-10-CM | POA: Diagnosis not present

## 2022-07-10 DIAGNOSIS — J069 Acute upper respiratory infection, unspecified: Secondary | ICD-10-CM

## 2022-07-10 DIAGNOSIS — R051 Acute cough: Secondary | ICD-10-CM

## 2022-07-10 LAB — POC COVID19 BINAXNOW: SARS Coronavirus 2 Ag: NEGATIVE

## 2022-07-10 MED ORDER — AZITHROMYCIN 250 MG PO TABS: ORAL_TABLET | ORAL | 0 refills | Status: AC

## 2022-07-10 MED ORDER — BENZONATATE 100 MG PO CAPS: 100.0000 mg | ORAL_CAPSULE | Freq: Three times a day (TID) | ORAL | 0 refills | Status: DC | PRN

## 2022-07-10 NOTE — Progress Notes (Signed)
Subjective:     Patient ID: Amanda Mathews, female    DOB: 14-Jul-1946, 75 y.o.   MRN: [id number]  Chief Complaint  Patient presents with   Sore Throat    Using salt water with no relief   Cough    Sx started yesterday   Nasal Congestion   Ear Pain    Bilateral ear pain that started today     HPI Cough since yesterday, congestion, sore throat.  Ear pain B today.  No f/c.  No sob.  +HA.  If not get seen, will get bronchitis/pneumonia.    Hasn't checked covid.  Gets yearly.     Health Maintenance Due  Topic Date Due   MAMMOGRAM  12/27/2021    Past Medical History:  Diagnosis Date   Cataract    Osteoporosis    Sleep apnea    Thyroid disease     Past Surgical History:  Procedure Laterality Date   back stimulator      BUNIONECTOMY     CHOLECYSTECTOMY     FEMUR FRACTURE SURGERY     REPLACEMENT TOTAL KNEE BILATERAL Bilateral     Outpatient Medications Prior to Visit  Medication Sig Dispense Refill   ammonium lactate (AMLACTIN) 12 % cream APPLY THIN COAT TO AFFECTED AREAS & RUB IN TWICE A DAY IN THE MORNING & IN THE EVENING     buPROPion (WELLBUTRIN XL) 300 MG 24 hr tablet Take 300 mg by mouth daily.     Calcium Carb-Cholecalciferol (CALCIUM 600-D PO) Take 600 mg by mouth daily.     carbamazepine (TEGRETOL) 200 MG tablet Take 200 mg by mouth 2 (two) times daily. 5 times daily 200 mg     Cholecalciferol (VITAMIN D3) 125 MCG (5000 UT) CAPS Take 5,000 Units by mouth daily.     denosumab (PROLIA) 60 MG/ML SOSY injection Inject 60 mg into the skin every 6 (six) months.     gabapentin (NEURONTIN) 300 MG capsule TAKE 1 CAPSULE BY MOUTH THREE TIMES A DAY (Patient taking differently: Twice a day) 90 capsule 5   hydrochlorothiazide (HYDRODIURIL) 25 MG tablet Take 0.5 tablets (12.5 mg total) by mouth daily. 90 tablet 3   hydrocortisone 2.5 % cream Apply topically daily as needed.     levothyroxine (SYNTHROID) 150 MCG tablet TAKE 1 TABLET BY MOUTH EVERY DAY BEFORE BREAKFAST 90  tablet 1   MAGNESIUM PO Take 1 tablet by mouth 2 (two) times daily. 250 mg     meloxicam (MOBIC) 7.5 MG tablet TAKE 1 TABLET BY MOUTH EVERY DAY 30 tablet 0   methocarbamol (ROBAXIN) 750 MG tablet TAKE 1 TABLET BY MOUTH TWICE A DAY AS NEEDED FOR MUSCLE SPASMS 60 tablet 0   nystatin cream (MYCOSTATIN) nystatin 100,000 unit/gram topical cream     potassium chloride SA (KLOR-CON) 20 MEQ tablet Take 20 mEq by mouth daily.     simvastatin (ZOCOR) 20 MG tablet TAKE 1 TABLET BY MOUTH EVERYDAY AT BEDTIME 90 tablet 3   triamcinolone cream (KENALOG) 0.1 % MIX WITH NYSTATIN AND APPLY A THIN LAYER TO THE AFFECTED AREA TWICE DAILY AS NEEDED     No facility-administered medications prior to visit.    Allergies  Allergen Reactions   Codeine Nausea And Vomiting and Nausea Only    Other reaction(s): vomiting   Hydromorphone Nausea Only and Nausea And Vomiting   Morphine Nausea Only and Other (See Comments)    Hallucinations  Other reaction(s): Delusions (intolerance), hallucinations, Other (See Comments) Hallucinations  Ropinirole     hallucinations Other reaction(s): Delusions (intolerance) hallucinations   ROS neg/noncontributory except as noted HPI/below      Objective:     BP 118/80   Pulse 82   Temp 98.7 F (37.1 C) (Temporal)   Ht 5' (1.524 m)   Wt 175 lb 6 oz (79.5 kg)   SpO2 94%   BMI 34.25 kg/m  Wt Readings from Last 3 Encounters:  07/10/22 175 lb 6 oz (79.5 kg)  05/06/22 178 lb (80.7 kg)  04/29/22 179 lb 3.2 oz (81.3 kg)    Physical Exam   Gen: WDWN NAD HEENT: NCAT, conjunctiva not injected, sclera nonicteric TM WNL B, OP moist, no exudates  NECK:  supple, no thyromegaly, no nodes, no carotid bruits CARDIAC: RRR, S1S2+, no murmur. DP 2+B LUNGS: CTAB. No wheezes EXT: chronic, mild edema MSK: no gross abnormalities.  NEURO: A&O x3.  CN II-XII intact.  PSYCH: normal mood. Good eye contact  Results for orders placed or performed in visit on 07/10/22  POC COVID-19   Result Value Ref Range   SARS Coronavirus 2 Ag Negative Negative        Assessment & Plan:   Problem List Items Addressed This Visit   None Visit Diagnoses     Upper respiratory tract infection, unspecified type    -  Primary   Relevant Medications   azithromycin (ZITHROMAX) 250 MG tablet      URI-symptomatic tx.  Zpk.  Worse/sob, to ER.    Meds ordered this encounter  Medications   azithromycin (ZITHROMAX) 250 MG tablet    Sig: Take 2 tablets on day 1, then 1 tablet daily on days 2 through 5    Dispense:  6 tablet    Refill:  0    Angelena Sole, MD

## 2022-07-10 NOTE — Patient Instructions (Addendum)
It was very nice to see you today!  If worse, let us know or ER if trouble breathing.  Don't take cholesterol med(simvastatin) while on the antibiotic.    PLEASE NOTE:  If you had any lab tests please let us know if you have not heard back within a few days. You may see your results on MyChart before we have a chance to review them but we will give you a call once they are reviewed by [place]. If we ordered any referrals today, please let us know if you have not heard from their office within the next week.   Please try these tips to maintain a healthy lifestyle:  Eat most of your calories during the day when you are active. Eliminate processed foods including packaged sweets (pies, cakes, cookies), reduce intake of potatoes, white bread, white pasta, and white rice. Look for whole grain options, oat flour or almond flour.  Each meal should contain half fruits/vegetables, one quarter protein, and one quarter carbs (no bigger than a computer mouse).  Cut down on sweet beverages. This includes juice, soda, and sweet tea. Also watch fruit intake, though this is a healthier sweet option, it still contains natural sugar! Limit to 3 servings daily.  Drink at least 1 glass of water with each meal and aim for at least 8 glasses per day  Exercise at least 150 minutes every week.

## 2022-07-18 DIAGNOSIS — M25572 Pain in left ankle and joints of left foot: Secondary | ICD-10-CM | POA: Diagnosis not present

## 2022-07-18 DIAGNOSIS — R269 Unspecified abnormalities of gait and mobility: Secondary | ICD-10-CM | POA: Diagnosis not present

## 2022-07-18 DIAGNOSIS — M2041 Other hammer toe(s) (acquired), right foot: Secondary | ICD-10-CM | POA: Diagnosis not present

## 2022-07-18 DIAGNOSIS — M47816 Spondylosis without myelopathy or radiculopathy, lumbar region: Secondary | ICD-10-CM | POA: Diagnosis not present

## 2022-07-18 DIAGNOSIS — M2012 Hallux valgus (acquired), left foot: Secondary | ICD-10-CM | POA: Diagnosis not present

## 2022-07-18 DIAGNOSIS — M2042 Other hammer toe(s) (acquired), left foot: Secondary | ICD-10-CM | POA: Diagnosis not present

## 2022-07-18 DIAGNOSIS — M545 Low back pain, unspecified: Secondary | ICD-10-CM | POA: Diagnosis not present

## 2022-07-18 DIAGNOSIS — R600 Localized edema: Secondary | ICD-10-CM | POA: Diagnosis not present

## 2022-07-18 DIAGNOSIS — G8929 Other chronic pain: Secondary | ICD-10-CM | POA: Diagnosis not present

## 2022-07-18 DIAGNOSIS — M2170 Unequal limb length (acquired), unspecified site: Secondary | ICD-10-CM | POA: Diagnosis not present

## 2022-07-18 DIAGNOSIS — M1990 Unspecified osteoarthritis, unspecified site: Secondary | ICD-10-CM | POA: Diagnosis not present

## 2022-07-23 ENCOUNTER — Other Ambulatory Visit: Payer: Self-pay | Admitting: Family Medicine

## 2022-07-24 ENCOUNTER — Other Ambulatory Visit: Payer: Self-pay | Admitting: Family Medicine

## 2022-07-26 DIAGNOSIS — G500 Trigeminal neuralgia: Secondary | ICD-10-CM | POA: Diagnosis not present

## 2022-07-26 DIAGNOSIS — Z6834 Body mass index (BMI) 34.0-34.9, adult: Secondary | ICD-10-CM | POA: Diagnosis not present

## 2022-07-29 ENCOUNTER — Ambulatory Visit: Payer: Medicare PPO | Admitting: Family Medicine

## 2022-07-29 ENCOUNTER — Encounter: Payer: Self-pay | Admitting: Family Medicine

## 2022-07-29 VITALS — BP 117/71 | HR 59 | Temp 98.1°F | Ht 60.0 in | Wt 172.4 lb

## 2022-07-29 DIAGNOSIS — E039 Hypothyroidism, unspecified: Secondary | ICD-10-CM

## 2022-07-29 DIAGNOSIS — I10 Essential (primary) hypertension: Secondary | ICD-10-CM

## 2022-07-29 DIAGNOSIS — F325 Major depressive disorder, single episode, in full remission: Secondary | ICD-10-CM

## 2022-07-29 DIAGNOSIS — E785 Hyperlipidemia, unspecified: Secondary | ICD-10-CM | POA: Diagnosis not present

## 2022-07-29 NOTE — Assessment & Plan Note (Signed)
Blood pressure at goal on HCTZ 12.5 once daily.

## 2022-07-29 NOTE — Patient Instructions (Signed)
It was very nice to see you today!  We will order a ultrasound on the arteries in your neck.  We will contact you with results once they are available.  Take care, Dr [name]  PLEASE NOTE:  If you had any lab tests please let us know if you have not heard back within a few days. You may see your results on mychart before we have a chance to review them but we will give you a call once they are reviewed by [place]. If we ordered any referrals today, please let us know if you have not heard from their office within the next week.   Please try these tips to maintain a healthy lifestyle:  Eat at least 3 REAL meals and 1-2 snacks per day.  Aim for no more than 5 hours between eating.  If you eat breakfast, please do so within one hour of getting up.   Each meal should contain half fruits/vegetables, one quarter protein, and one quarter carbs (no bigger than a computer mouse)  Cut down on sweet beverages. This includes juice, soda, and sweet tea.   Drink at least 1 glass of water with each meal and aim for at least 8 glasses per day  Exercise at least 150 minutes every week.

## 2022-07-29 NOTE — Assessment & Plan Note (Signed)
Stable on Synthroid 150 mcg daily  

## 2022-07-29 NOTE — Assessment & Plan Note (Signed)
On simvastatin 20 mg daily.  We will check carotid artery screening per patient request.  Has a family history of stroke.

## 2022-07-29 NOTE — Progress Notes (Signed)
   Amanda Mathews is a 76 y.o. female who presents today for an office visit.  Assessment/Plan:  Chronic Problems Addressed Today: Dyslipidemia On simvastatin 20 mg daily.  We will check carotid artery screening per patient request.  Has a family history of stroke.  Hypothyroidism Stable on Synthroid 150 mcg daily.  Essential hypertension Blood pressure at goal on HCTZ 12.5 once daily.     Subjective:  HPI:  See A/P for status of chronic conditions.       Objective:  Physical Exam: BP 117/71   Pulse (!) 59   Temp 98.1 F (36.7 C) (Temporal)   Ht 5' (1.524 m)   Wt 172 lb 6.4 oz (78.2 kg)   SpO2 95%   BMI 33.67 kg/m   Gen: No acute distress, resting comfortably CV: Regular rate and rhythm with no murmurs appreciated Pulm: Normal work of breathing, clear to auscultation bilaterally with no crackles, wheezes, or rhonchi Neuro: Grossly normal, moves all extremities Psych: Normal affect and thought content      [place] M. [name], MD 07/29/2022 11:42 AM

## 2022-08-06 ENCOUNTER — Other Ambulatory Visit: Payer: Self-pay | Admitting: Family Medicine

## 2022-08-07 DIAGNOSIS — L853 Xerosis cutis: Secondary | ICD-10-CM | POA: Diagnosis not present

## 2022-08-07 DIAGNOSIS — Z08 Encounter for follow-up examination after completed treatment for malignant neoplasm: Secondary | ICD-10-CM | POA: Diagnosis not present

## 2022-08-07 DIAGNOSIS — L538 Other specified erythematous conditions: Secondary | ICD-10-CM | POA: Diagnosis not present

## 2022-08-07 DIAGNOSIS — Z85828 Personal history of other malignant neoplasm of skin: Secondary | ICD-10-CM | POA: Diagnosis not present

## 2022-08-07 DIAGNOSIS — L298 Other pruritus: Secondary | ICD-10-CM | POA: Diagnosis not present

## 2022-08-07 DIAGNOSIS — D225 Melanocytic nevi of trunk: Secondary | ICD-10-CM | POA: Diagnosis not present

## 2022-08-07 DIAGNOSIS — L304 Erythema intertrigo: Secondary | ICD-10-CM | POA: Diagnosis not present

## 2022-08-07 DIAGNOSIS — R208 Other disturbances of skin sensation: Secondary | ICD-10-CM | POA: Diagnosis not present

## 2022-08-07 DIAGNOSIS — L820 Inflamed seborrheic keratosis: Secondary | ICD-10-CM | POA: Diagnosis not present

## 2022-08-08 ENCOUNTER — Ambulatory Visit (INDEPENDENT_AMBULATORY_CARE_PROVIDER_SITE_OTHER): Payer: Medicare PPO | Admitting: Family

## 2022-08-08 ENCOUNTER — Telehealth: Payer: Self-pay | Admitting: Family Medicine

## 2022-08-08 ENCOUNTER — Encounter: Payer: Self-pay | Admitting: Family

## 2022-08-08 VITALS — BP 135/67 | HR 91 | Temp 98.0°F | Ht 60.0 in | Wt 173.2 lb

## 2022-08-08 DIAGNOSIS — M545 Low back pain, unspecified: Secondary | ICD-10-CM

## 2022-08-08 NOTE — Patient Instructions (Addendum)
It was very nice to see you today!   OK to take your Methocarbamol twice a day for the next 4-5 days. You can also take generic Aleve (Naproxen) 1 tablet twice a day. Look for Lidocaine pain patches to apply to your back. If the pain is not getting better by next week, call the Orthopedic office in [place] first, if they can not see you, then call our office back.  Go by a medical supply store and look for a lumbar cushion for your car and when you are at home to give you support and reduce pain.  You can also ask about a raised toilet seat to place over your toilet.    PLEASE NOTE:  If you had any lab tests please let us know if you have not heard back within a few days. You may see your results on MyChart before we have a chance to review them but we will give you a call once they are reviewed by [place]. If we ordered any referrals today, please let us know if you have not heard from their office within the next week.

## 2022-08-08 NOTE — Telephone Encounter (Addendum)
PCP alerted about conflicting information given. Triage nurse notes handed to PCP Team  Patient Name: Amanda Mathews [facility] Gender: Female DOB: 1946-12-07 Age: 76 Y 2 M 11 D Return Phone Number: [phone] (Primary) [place] [address] Client [facility] - Administrator, [facility] Provider Jacquiline Doe- MD Contact Type Call Who Is Calling Patient / Member / Family / Caregiver Call Type Triage / Clinical Relationship To Patient Self Return Phone Number [phone] (Primary) Chief Complaint Walking difficulty Reason for Call Symptomatic / Request for Health Information Initial Comment Caller states that she fell 1 week ago and fell on the right side of her body. She hurt her back and she is having trouble walking. Additional Comment She is requesting medication. Translation No Nurse Assessment Nurse: Annye English, RN, Denise Date/Time (Eastern Time): 08/08/2022 9:33:04 AM Confirm and document reason for call. If symptomatic, describe symptoms. ---Caller states that she fell 1 week ago and fell on the right side of her body. She hurt her back and she is having trouble walking. Does the patient have any new or worsening symptoms? ---Yes Will a triage be completed? ---Yes Related visit to physician within the last 2 weeks? ---No Does the PT have any chronic conditions? (i.e. diabetes, asthma, this includes High risk factors for pregnancy, etc.) ---No Is this a behavioral health or substance abuse call? ---No Guidelines Guideline Title Affirmed Question Affirmed Notes Nurse Date/Time (Eastern Time) Back Injury Weakness of a leg or foot (e.g., unable to bear weight, dragging foot) Carmon, RN, Angelique Blonder 08/08/2022 9:33:38 AM Disp. Time Lamount Cohen Time) Disposition Final User 08/08/2022 9:36:12 AM Go to ED Now (or PCP triage) Yes Annye English, RN, Angelique Blonder  Final Disposition 08/08/2022 9:36:12  AM Go to ED Now (or PCP triage) Yes Annye English, RN, Leighton Ruff Disagree/Comply Comply Caller Understands Yes PreDisposition Call Doctor Care Advice Given Per Guideline GO TO ED NOW (OR PCP TRIAGE): PAIN MEDICINES: * IBUPROFEN (E.G., MOTRIN, ADVIL): Take 400 mg (two 200 mg pills) by mouth every 6 hours. The most you should take is 6 pills a day (1,200 mg total). CARE ADVICE given per Back Injury (Adult) guideline. Referrals REFERRED TO PCP OFFICE

## 2022-08-08 NOTE — Telephone Encounter (Signed)
Patient states: -She was walking when she lost her balance and fell landing on her right side a week ago  - She has been having worsening back pain from the right side of her back toward the center - At times it is hard to walk due to pain  Patient has been transferred to triage.

## 2022-08-08 NOTE — Telephone Encounter (Signed)
It should not need a RX, but if she wants insurance to pay for it, it may require this. She should call the medical supply store and ask, if needed I can supply.

## 2022-08-08 NOTE — Progress Notes (Signed)
Patient ID: Amanda Mathews, female    DOB: July 30, 1946, 76 y.o.   MRN: [id number]  Chief Complaint  Patient presents with   Fall    Pt states she fell 1 week ago and fell on the right side of her body. She hurt her back and she is having trouble walking. Pt states the middle of her back is very painful and its worse at night. Has tried advil which does help it some.     HPI: Back pain:  fell a week ago, down onto her right knee and side, able to get up on her own. She was feeling ok, drove herself down to [place] and back 2 days ago, but then night before last she was feeling a lot of pain. Reports taking Tylenol with mild relief. Also takes Robaxin prn and she took 1 tab twice a day with some relief. Pt using a walker to ambulate as she thinks the cane makes her unstable.  Assessment & Plan:  1. Acute right-sided low back pain without sciatica no bruising, swelling or erythema noted on right lumbar. pt has Amanda implantable TENS device intact, does not appear damaged. Advised pt on taking her Robaxin bid with generic Aleve (has at home) for the next 4-5 days. Pt is established with [facility] in [place] and down in [facility], advised she call the [place] office if pain is not resolving and call here if they are unable to see her quickly.   Subjective:    Outpatient Medications Prior to Visit  Medication Sig Dispense Refill   ammonium lactate (AMLACTIN) 12 % cream APPLY THIN COAT TO AFFECTED AREAS & RUB IN TWICE A DAY IN THE MORNING & IN THE EVENING     benzonatate (TESSALON PERLES) 100 MG capsule Take 1 capsule (100 mg total) by mouth 3 (three) times daily as needed for cough. 20 capsule 0   buPROPion (WELLBUTRIN XL) 300 MG 24 hr tablet Take 300 mg by mouth daily.     Calcium Carb-Cholecalciferol (CALCIUM 600-D PO) Take 600 mg by mouth daily.     carbamazepine (TEGRETOL) 200 MG tablet Take 200 mg by mouth 2 (two) times daily. 5 times daily 200 mg     Cholecalciferol (VITAMIN D3) 125  MCG (5000 UT) CAPS Take 5,000 Units by mouth daily.     denosumab (PROLIA) 60 MG/ML SOSY injection Inject 60 mg into the skin every 6 (six) months.     gabapentin (NEURONTIN) 300 MG capsule TAKE 1 CAPSULE BY MOUTH THREE TIMES A DAY (Patient taking differently: Twice a day) 90 capsule 5   hydrochlorothiazide (HYDRODIURIL) 25 MG tablet Take 0.5 tablets (12.5 mg total) by mouth daily. 90 tablet 3   hydrocortisone 2.5 % cream Apply topically daily as needed.     levothyroxine (SYNTHROID) 150 MCG tablet TAKE 1 TABLET BY MOUTH EVERY DAY BEFORE BREAKFAST 90 tablet 1   MAGNESIUM PO Take 1 tablet by mouth 2 (two) times daily. 250 mg     meloxicam (MOBIC) 7.5 MG tablet TAKE 1 TABLET BY MOUTH EVERY DAY 30 tablet 0   methocarbamol (ROBAXIN) 750 MG tablet TAKE 1 TABLET BY MOUTH TWICE A DAY AS NEEDED FOR MUSCLE SPASM 60 tablet 0   nystatin cream (MYCOSTATIN) nystatin 100,000 unit/gram topical cream     potassium chloride SA (KLOR-CON) 20 MEQ tablet Take 20 mEq by mouth daily.     simvastatin (ZOCOR) 20 MG tablet TAKE 1 TABLET BY MOUTH EVERYDAY AT BEDTIME 90 tablet 3  triamcinolone cream (KENALOG) 0.1 % MIX WITH NYSTATIN AND APPLY A THIN LAYER TO THE AFFECTED AREA TWICE DAILY AS NEEDED     No facility-administered medications prior to visit.   Past Medical History:  Diagnosis Date   Cataract    Osteoporosis    Sleep apnea    Thyroid disease    Past Surgical History:  Procedure Laterality Date   back stimulator      BUNIONECTOMY     CHOLECYSTECTOMY     FEMUR FRACTURE SURGERY     REPLACEMENT TOTAL KNEE BILATERAL Bilateral    Allergies  Allergen Reactions   Codeine Nausea And Vomiting and Nausea Only    Other reaction(s): vomiting   Hydromorphone Nausea Only and Nausea And Vomiting   Morphine Nausea Only and Other (See Comments)    Hallucinations  Other reaction(s): Delusions (intolerance), hallucinations, Other (See Comments) Hallucinations     Ropinirole     hallucinations Other  reaction(s): Delusions (intolerance) hallucinations      Objective:    Physical Exam Vitals and nursing note reviewed.  Constitutional:      Appearance: Normal appearance.  Cardiovascular:     Rate and Rhythm: Normal rate and regular rhythm.  Pulmonary:     Effort: Pulmonary effort is normal.     Breath sounds: Normal breath sounds.  Musculoskeletal:     Lumbar back: No swelling, spasms or tenderness. Decreased range of motion.  Skin:    General: Skin is warm and dry.  Neurological:     Mental Status: She is alert.  Psychiatric:        Mood and Affect: Mood normal.        Behavior: Behavior normal.    BP 135/67 (BP Location: Left Arm, Patient Position: Sitting, Cuff Size: Large)   Pulse 91   Temp 98 F (36.7 C) (Temporal)   Ht 5' (1.524 m)   Wt 173 lb 4 oz (78.6 kg)   SpO2 97%   BMI 33.84 kg/m  Wt Readings from Last 3 Encounters:  08/08/22 173 lb 4 oz (78.6 kg)  07/29/22 172 lb 6.4 oz (78.2 kg)  07/10/22 175 lb 6 oz (79.5 kg)       Dulce Sellar, NP

## 2022-08-09 DIAGNOSIS — Z1231 Encounter for screening mammogram for malignant neoplasm of breast: Secondary | ICD-10-CM | POA: Diagnosis not present

## 2022-08-09 NOTE — Telephone Encounter (Signed)
LVM in regards 

## 2022-08-10 LAB — HM MAMMOGRAPHY: HM Mammogram: NORMAL (ref 0–4)

## 2022-08-12 DIAGNOSIS — R351 Nocturia: Secondary | ICD-10-CM | POA: Diagnosis not present

## 2022-08-12 DIAGNOSIS — M6281 Muscle weakness (generalized): Secondary | ICD-10-CM | POA: Diagnosis not present

## 2022-08-12 DIAGNOSIS — R3914 Feeling of incomplete bladder emptying: Secondary | ICD-10-CM | POA: Diagnosis not present

## 2022-08-12 DIAGNOSIS — M62838 Other muscle spasm: Secondary | ICD-10-CM | POA: Diagnosis not present

## 2022-08-12 DIAGNOSIS — R159 Full incontinence of feces: Secondary | ICD-10-CM | POA: Diagnosis not present

## 2022-08-13 ENCOUNTER — Ambulatory Visit
Admission: RE | Admit: 2022-08-13 | Discharge: 2022-08-13 | Disposition: A | Discharge status: Home / Self Care | Payer: Medicare PPO | Source: Ambulatory Visit | Attending: Family Medicine | Admitting: Family Medicine

## 2022-08-13 DIAGNOSIS — E782 Mixed hyperlipidemia: Secondary | ICD-10-CM | POA: Diagnosis not present

## 2022-08-13 DIAGNOSIS — I10 Essential (primary) hypertension: Secondary | ICD-10-CM

## 2022-08-15 NOTE — Progress Notes (Signed)
Please inform patient of the following:  Great news!  Her ultrasound of her neck shows no blockage in any of her carotid arteries.

## 2022-08-22 DIAGNOSIS — M47816 Spondylosis without myelopathy or radiculopathy, lumbar region: Secondary | ICD-10-CM | POA: Diagnosis not present

## 2022-08-22 DIAGNOSIS — M545 Low back pain, unspecified: Secondary | ICD-10-CM | POA: Diagnosis not present

## 2022-08-23 ENCOUNTER — Other Ambulatory Visit: Payer: Self-pay | Admitting: Family Medicine

## 2022-08-30 ENCOUNTER — Other Ambulatory Visit: Payer: Self-pay | Admitting: Family Medicine

## 2022-09-02 DIAGNOSIS — G8929 Other chronic pain: Secondary | ICD-10-CM | POA: Diagnosis not present

## 2022-09-02 DIAGNOSIS — M545 Low back pain, unspecified: Secondary | ICD-10-CM | POA: Diagnosis not present

## 2022-09-02 DIAGNOSIS — M47816 Spondylosis without myelopathy or radiculopathy, lumbar region: Secondary | ICD-10-CM | POA: Diagnosis not present

## 2022-09-03 ENCOUNTER — Ambulatory Visit: Payer: Medicare PPO | Admitting: Family Medicine

## 2022-09-04 DIAGNOSIS — M62838 Other muscle spasm: Secondary | ICD-10-CM | POA: Diagnosis not present

## 2022-09-04 DIAGNOSIS — R3914 Feeling of incomplete bladder emptying: Secondary | ICD-10-CM | POA: Diagnosis not present

## 2022-09-04 DIAGNOSIS — R351 Nocturia: Secondary | ICD-10-CM | POA: Diagnosis not present

## 2022-09-04 DIAGNOSIS — R159 Full incontinence of feces: Secondary | ICD-10-CM | POA: Diagnosis not present

## 2022-09-04 DIAGNOSIS — M6281 Muscle weakness (generalized): Secondary | ICD-10-CM | POA: Diagnosis not present

## 2022-09-09 ENCOUNTER — Encounter: Payer: Self-pay | Admitting: *Deleted

## 2022-09-12 DIAGNOSIS — R350 Frequency of micturition: Secondary | ICD-10-CM | POA: Diagnosis not present

## 2022-09-19 ENCOUNTER — Encounter: Payer: Self-pay | Admitting: Family

## 2022-09-19 ENCOUNTER — Ambulatory Visit: Payer: Medicare PPO | Admitting: Family

## 2022-09-19 VITALS — BP 108/69 | HR 68 | Temp 97.8°F | Ht 60.0 in | Wt 175.8 lb

## 2022-09-19 DIAGNOSIS — J020 Streptococcal pharyngitis: Secondary | ICD-10-CM

## 2022-09-19 DIAGNOSIS — R059 Cough, unspecified: Secondary | ICD-10-CM | POA: Diagnosis not present

## 2022-09-19 LAB — POC COVID19 BINAXNOW: SARS Coronavirus 2 Ag: NEGATIVE

## 2022-09-19 LAB — POCT RAPID STREP A (OFFICE): Rapid Strep A Screen: POSITIVE — AB

## 2022-09-19 MED ORDER — BENZONATATE 100 MG PO CAPS: 100.0000 mg | ORAL_CAPSULE | Freq: Three times a day (TID) | ORAL | 0 refills | Status: DC | PRN

## 2022-09-19 MED ORDER — AMOXICILLIN 500 MG PO CAPS: 500.0000 mg | ORAL_CAPSULE | Freq: Two times a day (BID) | ORAL | 0 refills | Status: AC

## 2022-09-19 NOTE — Progress Notes (Signed)
Patient ID: Amanda Mathews, female    DOB: Jan 17, 1946, 76 y.o.   MRN: [id number]  Chief Complaint  Patient presents with   Sore Throat    since yesterday, with cough for 2 days     HPI:      Sore throat/cough:  Pt c/o sore throat, coughing for 2 days (non-productive) and right eye swelling/itching, hoarseness, present since last night. Has tried gargling salt water. Would like to discuss z -pack and tessalon pearls to keep from getting worse.  Assessment & Plan:  1. Strep throat sending AMOX, advised on use & SE. Drink plenty of water, ok to take Tylenol prn for pain for next few days.  - POC COVID-19 - POCT rapid strep A - amoxicillin (AMOXIL) 500 MG capsule; Take 1 capsule (500 mg total) by mouth 2 (two) times daily for 10 days.  Dispense: 20 capsule; Refill: 0  2. Cough in adult covid neg., lungs clear, advised pt she does not need abt at this point for cough, sending refill of Tessalon pearles, advised on hydration, ok to use cough lozenges prn.  - POC COVID-19 - benzonatate (TESSALON PERLES) 100 MG capsule; Take 1 capsule (100 mg total) by mouth 3 (three) times daily as needed for cough.  Dispense: 20 capsule; Refill: 0  Subjective:    Outpatient Medications Prior to Visit  Medication Sig Dispense Refill   ammonium lactate (AMLACTIN) 12 % cream APPLY THIN COAT TO AFFECTED AREAS & RUB IN TWICE A DAY IN THE MORNING & IN THE EVENING     Calcium Carb-Cholecalciferol (CALCIUM 600-D PO) Take 600 mg by mouth daily.     carbamazepine (TEGRETOL) 200 MG tablet Take 200 mg by mouth 2 (two) times daily. 5 times daily 200 mg     Cholecalciferol (VITAMIN D3) 125 MCG (5000 UT) CAPS Take 5,000 Units by mouth daily.     denosumab (PROLIA) 60 MG/ML SOSY injection Inject 60 mg into the skin every 6 (six) months.     gabapentin (NEURONTIN) 300 MG capsule TAKE 1 CAPSULE BY MOUTH THREE TIMES A DAY (Patient taking differently: Twice a day) 90 capsule 5   hydrochlorothiazide (HYDRODIURIL) 25 MG  tablet TAKE 1 TABLET (25 MG TOTAL) BY MOUTH DAILY. 90 tablet 3   hydrocortisone 2.5 % cream Apply topically daily as needed.     levothyroxine (SYNTHROID) 150 MCG tablet TAKE 1 TABLET BY MOUTH EVERY DAY BEFORE BREAKFAST 90 tablet 1   MAGNESIUM PO Take 1 tablet by mouth 2 (two) times daily. 250 mg     meloxicam (MOBIC) 7.5 MG tablet TAKE 1 TABLET BY MOUTH EVERY DAY 30 tablet 0   methocarbamol (ROBAXIN) 750 MG tablet TAKE 1 TABLET BY MOUTH TWICE A DAY AS NEEDED FOR MUSCLE SPASM 60 tablet 0   nystatin cream (MYCOSTATIN) nystatin 100,000 unit/gram topical cream     potassium chloride SA (KLOR-CON) 20 MEQ tablet Take 20 mEq by mouth daily.     simvastatin (ZOCOR) 20 MG tablet TAKE 1 TABLET BY MOUTH EVERYDAY AT BEDTIME 90 tablet 3   triamcinolone cream (KENALOG) 0.1 % MIX WITH NYSTATIN AND APPLY A THIN LAYER TO THE AFFECTED AREA TWICE DAILY AS NEEDED     benzonatate (TESSALON PERLES) 100 MG capsule Take 1 capsule (100 mg total) by mouth 3 (three) times daily as needed for cough. 20 capsule 0   buPROPion (WELLBUTRIN XL) 300 MG 24 hr tablet Take 300 mg by mouth daily.     No facility-administered medications prior  to visit.   Past Medical History:  Diagnosis Date   Cataract    Osteoporosis    Sleep apnea    Thyroid disease    Past Surgical History:  Procedure Laterality Date   back stimulator      BUNIONECTOMY     CHOLECYSTECTOMY     FEMUR FRACTURE SURGERY     REPLACEMENT TOTAL KNEE BILATERAL Bilateral    Allergies  Allergen Reactions   Codeine Nausea And Vomiting and Nausea Only    Other reaction(s): vomiting   Hydromorphone Nausea Only and Nausea And Vomiting   Morphine Nausea Only and Other (See Comments)    Hallucinations  Other reaction(s): Delusions (intolerance), hallucinations, Other (See Comments) Hallucinations     Ropinirole     hallucinations Other reaction(s): Delusions (intolerance) hallucinations      Objective:    Physical Exam Vitals and nursing note  reviewed.  Constitutional:      Appearance: Normal appearance.  HENT:     Right Ear: Tympanic membrane and ear canal normal.     Left Ear: Tympanic membrane and ear canal normal.     Mouth/Throat:     Mouth: Mucous membranes are moist.     Pharynx: Oropharyngeal exudate (clear mucus) and posterior oropharyngeal erythema (mild) present. No pharyngeal swelling or uvula swelling.  Eyes:     General: Lids are normal.     Conjunctiva/sclera: Conjunctivae normal.  Cardiovascular:     Rate and Rhythm: Normal rate and regular rhythm.  Pulmonary:     Effort: Pulmonary effort is normal.     Breath sounds: Normal breath sounds.  Musculoskeletal:        General: Normal range of motion.  Skin:    General: Skin is warm and dry.  Neurological:     Mental Status: She is alert.  Psychiatric:        Mood and Affect: Mood normal.        Behavior: Behavior normal.    BP 108/69 (BP Location: Left Arm, Patient Position: Sitting, Cuff Size: Large)   Pulse 68   Temp 97.8 F (36.6 C) (Temporal)   Ht 5' (1.524 m)   Wt 175 lb 12.8 oz (79.7 kg)   SpO2 97%   BMI 34.33 kg/m  Wt Readings from Last 3 Encounters:  09/19/22 175 lb 12.8 oz (79.7 kg)  08/08/22 173 lb 4 oz (78.6 kg)  07/29/22 172 lb 6.4 oz (78.2 kg)       Dulce Sellar, NP

## 2022-09-20 ENCOUNTER — Other Ambulatory Visit: Payer: Self-pay | Admitting: Family Medicine

## 2022-09-24 ENCOUNTER — Encounter: Payer: Self-pay | Admitting: Family

## 2022-09-24 ENCOUNTER — Ambulatory Visit (INDEPENDENT_AMBULATORY_CARE_PROVIDER_SITE_OTHER): Payer: Medicare PPO | Admitting: Family

## 2022-09-24 VITALS — BP 129/75 | HR 65 | Temp 97.9°F | Ht 60.0 in | Wt 171.6 lb

## 2022-09-24 DIAGNOSIS — R0982 Postnasal drip: Secondary | ICD-10-CM

## 2022-09-24 DIAGNOSIS — J209 Acute bronchitis, unspecified: Secondary | ICD-10-CM | POA: Diagnosis not present

## 2022-09-24 DIAGNOSIS — H6121 Impacted cerumen, right ear: Secondary | ICD-10-CM

## 2022-09-24 MED ORDER — AZITHROMYCIN 250 MG PO TABS: ORAL_TABLET | ORAL | 0 refills | Status: AC

## 2022-09-24 MED ORDER — LEVOCETIRIZINE DIHYDROCHLORIDE 5 MG PO TABS: 5.0000 mg | ORAL_TABLET | Freq: Every evening | ORAL | 5 refills | Status: DC

## 2022-09-24 MED ORDER — METHYLPREDNISOLONE ACETATE 80 MG/ML IJ SUSP
80.0000 mg | Freq: Once | INTRAMUSCULAR | Status: AC
  Administered 2022-09-24: 80 mg via INTRAMUSCULAR

## 2022-09-24 NOTE — Progress Notes (Signed)
Patient ID: Amanda Mathews, female    DOB: December 26, 1945, 76 y.o.   MRN: [id number]  Chief Complaint  Patient presents with   Follow-up    Positive for strep on 10/5, Pt states her symptoms are still the same.     HPI:      URI symptoms:  Pt seen 5 days ago for sore throat, non-productive cough, right eye swelling/itching, & hoarseness for 2d prior and found to have strep throat. Pt has been taking antibiotic for 6 days and states she is still having same symptoms. Has tried gargling salt water. Also reporting pain in right ear.  Would like to discuss z -pack to keep from getting worse.      Assessment & Plan:  1. Acute bronchitis, unspecified organism sending Zpack, but advised to wait another day to start. Given steroid injection in office, advised on use & SE of both. Advised on continuing increased fluid intake. Also sending Xyzal for throat secretions that may be contributing to cough.  - methylPREDNISolone acetate (DEPO-MEDROL) injection 80 mg - azithromycin (ZITHROMAX) 250 MG tablet; START Wednesday.  Take 2 tablets on day 1, then 1 tablet daily on days 2 through 5.  Dispense: 6 tablet; Refill: 0  2. Impacted cerumen of right ear Verbal consent received to perform right ear lavage via Hydrogen peroxide/water mix solution. Curette used to remove visible cerumen. Pt tolerated well, complete evacuation of all cerumen obtained. Mild erythema & slight bleeding noted in ear canal after procedure. Pt advised ok to take Tylenol at home for ear soreness.  - Ear Lavage  3. Postnasal drip also reports clearing her throat a lot, pt sister present and confirms this. Pt has not used any cold/sinus or allergy meds. Sending generic Xyzal, advised on use & SE.  - levocetirizine (XYZAL) 5 MG tablet; Take 1 tablet (5 mg total) by mouth every evening.  Dispense: 30 tablet; Refill: 5    Subjective:    Outpatient Medications Prior to Visit  Medication Sig Dispense Refill   ammonium lactate  (AMLACTIN) 12 % cream APPLY THIN COAT TO AFFECTED AREAS & RUB IN TWICE A DAY IN THE MORNING & IN THE EVENING     amoxicillin (AMOXIL) 500 MG capsule Take 1 capsule (500 mg total) by mouth 2 (two) times daily for 10 days. 20 capsule 0   benzonatate (TESSALON PERLES) 100 MG capsule Take 1 capsule (100 mg total) by mouth 3 (three) times daily as needed for cough. 20 capsule 0   Calcium Carb-Cholecalciferol (CALCIUM 600-D PO) Take 600 mg by mouth daily.     carbamazepine (TEGRETOL) 200 MG tablet Take 200 mg by mouth 2 (two) times daily. 5 times daily 200 mg     Cholecalciferol (VITAMIN D3) 125 MCG (5000 UT) CAPS Take 5,000 Units by mouth daily.     denosumab (PROLIA) 60 MG/ML SOSY injection Inject 60 mg into the skin every 6 (six) months.     gabapentin (NEURONTIN) 300 MG capsule TAKE 1 CAPSULE BY MOUTH THREE TIMES A DAY (Patient taking differently: Twice a day) 90 capsule 5   hydrochlorothiazide (HYDRODIURIL) 25 MG tablet TAKE 1 TABLET (25 MG TOTAL) BY MOUTH DAILY. 90 tablet 3   hydrocortisone 2.5 % cream Apply topically daily as needed.     levothyroxine (SYNTHROID) 150 MCG tablet TAKE 1 TABLET BY MOUTH EVERY DAY BEFORE BREAKFAST 90 tablet 1   MAGNESIUM PO Take 1 tablet by mouth 2 (two) times daily. 250 mg  meloxicam (MOBIC) 7.5 MG tablet TAKE 1 TABLET BY MOUTH EVERY DAY 30 tablet 0   methocarbamol (ROBAXIN) 750 MG tablet TAKE 1 TABLET BY MOUTH TWICE A DAY AS NEEDED FOR MUSCLE SPASM 60 tablet 0   nystatin cream (MYCOSTATIN) nystatin 100,000 unit/gram topical cream     potassium chloride SA (KLOR-CON) 20 MEQ tablet Take 20 mEq by mouth daily.     simvastatin (ZOCOR) 20 MG tablet TAKE 1 TABLET BY MOUTH EVERYDAY AT BEDTIME 90 tablet 3   triamcinolone cream (KENALOG) 0.1 % MIX WITH NYSTATIN AND APPLY A THIN LAYER TO THE AFFECTED AREA TWICE DAILY AS NEEDED     No facility-administered medications prior to visit.   Past Medical History:  Diagnosis Date   Cataract    Osteoporosis    Sleep apnea     Thyroid disease    Past Surgical History:  Procedure Laterality Date   back stimulator      BUNIONECTOMY     CHOLECYSTECTOMY     FEMUR FRACTURE SURGERY     REPLACEMENT TOTAL KNEE BILATERAL Bilateral    Allergies  Allergen Reactions   Codeine Nausea And Vomiting and Nausea Only    Other reaction(s): vomiting   Hydromorphone Nausea Only and Nausea And Vomiting   Morphine Nausea Only and Other (See Comments)    Hallucinations  Other reaction(s): Delusions (intolerance), hallucinations, Other (See Comments) Hallucinations     Ropinirole     hallucinations Other reaction(s): Delusions (intolerance) hallucinations      Objective:    Physical Exam Vitals and nursing note reviewed.  Constitutional:      Appearance: Normal appearance.  HENT:     Right Ear: Tympanic membrane normal. There is impacted cerumen (TM wnl after cerumen removal).     Left Ear: Tympanic membrane normal. There is impacted cerumen (mild).     Mouth/Throat:     Mouth: Mucous membranes are moist.     Pharynx: Oropharyngeal exudate present. No pharyngeal swelling, posterior oropharyngeal erythema or uvula swelling.  Cardiovascular:     Rate and Rhythm: Normal rate and regular rhythm.  Pulmonary:     Effort: Pulmonary effort is normal.     Breath sounds: Normal breath sounds.  Musculoskeletal:        General: Normal range of motion.  Lymphadenopathy:     Cervical: Cervical adenopathy present.     Right cervical: Superficial cervical adenopathy present.     Left cervical: Superficial cervical adenopathy present.  Skin:    General: Skin is warm and dry.  Neurological:     Mental Status: She is alert.  Psychiatric:        Mood and Affect: Mood normal.        Behavior: Behavior normal.    BP 129/75 (BP Location: Right Arm, Patient Position: Sitting, Cuff Size: Large)   Pulse 65   Temp 97.9 F (36.6 C) (Temporal)   Ht 5' (1.524 m)   Wt 171 lb 9.6 oz (77.8 kg)   SpO2 95%   BMI 33.51 kg/m  Wt  Readings from Last 3 Encounters:  09/24/22 171 lb 9.6 oz (77.8 kg)  09/19/22 175 lb 12.8 oz (79.7 kg)  08/08/22 173 lb 4 oz (78.6 kg)       Dulce Sellar, NP

## 2022-09-24 NOTE — Patient Instructions (Signed)
It was very nice to see you today!     I have sent over a Zpack to take for your cough - but DO NOT START until tomorrow - Wednesday.  I also sent over an antihistamine to dry up your throat secretions that can be contributing to your cough. You can start this today.  Continue the Amoxicillin antibiotic until finished for your sore throat.  Continue to drink WATER - shoot for 8 cups per day!       PLEASE NOTE:  If you had any lab tests please let us know if you have not heard back within a few days. You may see your results on MyChart before we have a chance to review them but we will give you a call once they are reviewed by [place]. If we ordered any referrals today, please let us know if you have not heard from their office within the next week.

## 2022-09-26 ENCOUNTER — Other Ambulatory Visit: Payer: Self-pay | Admitting: Family Medicine

## 2022-09-27 ENCOUNTER — Other Ambulatory Visit: Payer: Self-pay | Admitting: Family Medicine

## 2022-09-27 ENCOUNTER — Encounter: Payer: Self-pay | Admitting: Family Medicine

## 2022-09-27 ENCOUNTER — Other Ambulatory Visit: Payer: Self-pay | Admitting: Family

## 2022-09-27 DIAGNOSIS — R059 Cough, unspecified: Secondary | ICD-10-CM

## 2022-09-27 NOTE — Telephone Encounter (Signed)
error 

## 2022-09-28 NOTE — Telephone Encounter (Signed)
Prolia VOB initiated via [email or web address]  Last Prolia in 05/06/22 Next Prolia inj due 11/07/22

## 2022-10-06 NOTE — Telephone Encounter (Signed)
Pt ready for scheduling on or after 11/07/22   Out-of-pocket cost due at time of visit: $0   Primary: [employer] Prolia co-insurance: 0% Admin fee co-insurance: 0%   Secondary: n/a Prolia co-insurance:  Admin fee co-insurance:    Deductible: does not apply   Prior Auth: APPROVED PA# [id number] 12/16/21-/12/15/22     ** This summary of benefits is an estimation of the patient's out-of-pocket cost. Exact cost may very based on individual plan coverage.

## 2022-10-21 ENCOUNTER — Other Ambulatory Visit: Payer: Self-pay | Admitting: Family Medicine

## 2022-10-24 ENCOUNTER — Other Ambulatory Visit: Payer: Self-pay | Admitting: Family Medicine

## 2022-10-25 ENCOUNTER — Ambulatory Visit (INDEPENDENT_AMBULATORY_CARE_PROVIDER_SITE_OTHER): Payer: Medicare PPO

## 2022-10-25 ENCOUNTER — Telehealth: Payer: Self-pay | Admitting: Family Medicine

## 2022-10-25 VITALS — Wt 171.0 lb

## 2022-10-25 DIAGNOSIS — Z0000 Encounter for general adult medical examination without abnormal findings: Secondary | ICD-10-CM

## 2022-10-25 NOTE — Progress Notes (Cosign Needed Addendum)
I connected with  Clayborn BignessMartha Kay Reicher on 10/25/22 by a audio enabled telemedicine application and verified that I am speaking with the correct person using two identifiers.  Patient Location: Home  Provider Location: Office/Clinic  I discussed the limitations of evaluation and management by telemedicine. The patient expressed understanding and agreed to proceed.   Subjective:   Okey RegalMartha Kay Thibodaux is a 76 y.o. female who presents for Medicare Annual (Subsequent) preventive examination.  Review of Systems     Cardiac Risk Factors include: advanced age (>[id number], 72>65 women);dyslipidemia;hypertension;obesity (BMI >30kg/m2)     Objective:    Today's Vitals   10/25/22 0813  Weight: 171 lb (77.6 kg)   Body mass index is 33.4 kg/m.     10/25/2022    8:18 AM 05/06/2022    9:31 AM 02/19/2022    3:02 AM 12/25/2021   10:46 AM 10/12/2021    8:13 AM 10/19/2020    7:08 AM 10/06/2020    8:11 AM  Advanced Directives  Does Patient Have a Medical Advance Directive? Yes No No No Yes Yes Yes  Type of Estate agentAdvance Directive Healthcare Power of CurlewAttorney;Living will    Healthcare Power of Attorney Living will;Healthcare Power of State Street Corporationttorney Healthcare Power of [place] Lake[place]orney;Living will  Does patient want to make changes to medical advance directive? No - Patient declined     No - Patient declined   Copy of Healthcare Power of Attorney in Chart? Yes - validated most recent copy scanned in chart (See row information)    Yes - validated most recent copy scanned in chart (See row information) No - copy requested No - copy requested  Would patient like information on creating a medical advance directive?   No - Patient declined No - Patient declined       Current Medications (verified) Outpatient Encounter Medications as of 10/25/2022  Medication Sig   ammonium lactate (AMLACTIN) 12 % cream APPLY THIN COAT TO AFFECTED AREAS & RUB IN TWICE A DAY IN THE MORNING & IN THE EVENING   benzonatate (TESSALON PERLES) 100 MG  capsule Take 1 capsule (100 mg total) by mouth 3 (three) times daily as needed for cough.   Calcium Carb-Cholecalciferol (CALCIUM 600-D PO) Take 600 mg by mouth daily.   carbamazepine (TEGRETOL) 200 MG tablet Take 200 mg by mouth 2 (two) times daily. 5 times daily 200 mg   Cholecalciferol (VITAMIN D3) 125 MCG (5000 UT) CAPS Take 5,000 Units by mouth daily.   denosumab (PROLIA) 60 MG/ML SOSY injection Inject 60 mg into the skin every 6 (six) months.   gabapentin (NEURONTIN) 300 MG capsule TAKE 1 CAPSULE BY MOUTH THREE TIMES A DAY (Patient taking differently: Twice a day)   hydrochlorothiazide (HYDRODIURIL) 25 MG tablet TAKE 1 TABLET (25 MG TOTAL) BY MOUTH DAILY.   hydrocortisone 2.5 % cream Apply topically daily as needed.   levocetirizine (XYZAL) 5 MG tablet Take 1 tablet (5 mg total) by mouth every evening.   levothyroxine (SYNTHROID) 150 MCG tablet TAKE 1 TABLET BY MOUTH EVERY DAY BEFORE BREAKFAST   MAGNESIUM PO Take 1 tablet by mouth 2 (two) times daily. 250 mg   meloxicam (MOBIC) 7.5 MG tablet TAKE 1 TABLET BY MOUTH EVERY DAY   methocarbamol (ROBAXIN) 750 MG tablet TAKE 1 TABLET BY MOUTH TWICE A DAY AS NEEDED FOR MUSCLE SPASM   nystatin cream (MYCOSTATIN) nystatin 100,000 unit/gram topical cream   potassium chloride SA (KLOR-CON) 20 MEQ tablet Take 20 mEq by mouth daily.   simvastatin (ZOCOR)  20 MG tablet TAKE 1 TABLET BY MOUTH EVERYDAY AT BEDTIME   triamcinolone cream (KENALOG) 0.1 % MIX WITH NYSTATIN AND APPLY A THIN LAYER TO THE AFFECTED AREA TWICE DAILY AS NEEDED   No facility-administered encounter medications on file as of 10/25/2022.    Allergies (verified) Codeine, Hydromorphone, Morphine, and Ropinirole   History: Past Medical History:  Diagnosis Date   Cataract    Osteoporosis    Sleep apnea    Thyroid disease    Past Surgical History:  Procedure Laterality Date   back stimulator      BUNIONECTOMY     CHOLECYSTECTOMY     FEMUR FRACTURE SURGERY     REPLACEMENT TOTAL  KNEE BILATERAL Bilateral    Family History  Problem Relation Age of Onset   Hypertension Mother    Stroke Mother    Arthritis Sister    COPD Sister    Depression Sister    Diabetes Sister    Alcohol abuse Brother    Hypertension Sister    Social History   Socioeconomic History   Marital status: Divorced    Spouse name: Not on file   Number of children: Not on file   Years of education: Not on file   Highest education level: Not on file  Occupational History   Occupation: Retired    Occupation: retired    Comment: [employer]  Tobacco Use   Smoking status: Never   Smokeless tobacco: Never  Vaping Use   Vaping Use: Never used  Substance and Sexual Activity   Alcohol use: Never   Drug use: Never   Sexual activity: Not Currently    Birth control/protection: Abstinence  Other Topics Concern   Not on file  Social History Narrative   Right handed   Live alone 3 stairs to door   Caffeine 2 times daily   Social Determinants of Health   Financial Resource Strain: Low Risk  (10/25/2022)   Overall Financial Resource Strain (CARDIA)    Difficulty of Paying Living Expenses: Not hard at all  Food Insecurity: No Food Insecurity (10/25/2022)   Hunger Vital Sign    Worried About Running Out of Food in the Last Year: Never true    Ran Out of Food in the Last Year: Never true  Transportation Needs: No Transportation Needs (10/25/2022)   PRAPARE - Administrator, Civil Service (Medical): No    Lack of Transportation (Non-Medical): No  Physical Activity: Sufficiently Active (10/25/2022)   Exercise Vital Sign    Days of Exercise per Week: 5 days    Minutes of Exercise per Session: 60 min  Stress: No Stress Concern Present (10/25/2022)   [employer] of Occupational Health - Occupational Stress Questionnaire    Feeling of Stress : Not at all  Social Connections: Moderately Isolated (10/25/2022)   Social Connection and Isolation Panel [NHANES]    Frequency of  Communication with Friends and Family: More than three times a week    Frequency of Social Gatherings with Friends and Family: More than three times a week    Attends Religious Services: More than 4 times per year    Active Member of [employer] or Organizations: No    Attends Banker Meetings: Never    Marital Status: Divorced    Tobacco Counseling Counseling given: Not Answered   Clinical Intake:  Pre-visit preparation completed: Yes  Pain : No/denies pain     BMI - recorded: 33.4 Nutritional Status: BMI > 30  Obese Nutritional Risks: None Diabetes: No  How often do you need to have someone help you when you read instructions, pamphlets, or other written materials from your doctor or pharmacy?: 1 - Never  Diabetic?no  Interpreter Needed?: No  Information entered by :: Lanier Ensign, LPN   Activities of Daily Living    10/25/2022    8:23 AM 10/22/2022   10:00 AM  In your present state of health, do you have any difficulty performing the following activities:  Hearing? 0 0  Vision? 0 0  Difficulty concentrating or making decisions? 0 0  Walking or climbing stairs? 0 1  Dressing or bathing? 0 0  Doing errands, shopping? 0 0  Preparing Food and eating ? N N  Using the Toilet? N N  In the past six months, have you accidently leaked urine? N N  Do you have problems with loss of bowel control? N N  Managing your Medications? N N  Managing your Finances? N N  Housekeeping or managing your Housekeeping? N N    Patient Care Team: Ardith Dark, MD as PCP - General (Family Medicine)  Indicate any recent Medical Services you may have received from other than [facility] providers in the past year (date may be approximate).     Assessment:   This is a routine wellness examination for Geraldyn.  Hearing/Vision screen Hearing Screening - Comments:: Pt denies any hearing issues  Vision Screening - Comments:: Pt follows up with provider for annul eye exams Dr  Nile Riggs   Dietary issues and exercise activities discussed: Current Exercise Habits: Home exercise routine, Type of exercise: Other - see comments, Time (Minutes): 60, Frequency (Times/Week): 5, Weekly Exercise (Minutes/Week): 300   Goals Addressed             This Visit's Progress    Patient Stated       Lose 10 more lbs        Depression Screen    10/25/2022    8:22 AM 07/29/2022   11:06 AM 07/29/2022   11:04 AM 04/29/2022    9:27 AM 03/05/2022   10:32 AM 03/05/2022   10:11 AM 11/29/2021   10:54 AM  PHQ 2/9 Scores  PHQ - 2 Score 0 0 0 0 0 0 0  PHQ- 9 Score  0   0      Fall Risk    10/25/2022    8:21 AM 10/22/2022   10:00 AM 10/21/2022   11:24 AM 07/29/2022   11:04 AM 05/06/2022    9:30 AM  Fall Risk   Falls in the past year? 0 0 0 0 1  Number falls in past yr: 0   0 1  Injury with Fall? 0   0 0  Risk for fall due to : Impaired mobility;Impaired balance/gait;Impaired vision   No Fall Risks   Follow up Falls prevention discussed        FALL RISK PREVENTION PERTAINING TO THE HOME:  Any stairs in or around the home? Yes  If so, are there any without handrails? No  Home free of loose throw rugs in walkways, pet beds, electrical cords, etc? Yes  Adequate lighting in your home to reduce risk of falls? Yes   ASSISTIVE DEVICES UTILIZED TO PREVENT FALLS:  Life alert? No  Use of a cane, walker or w/c? Yes  Grab bars in the bathroom? Yes  Shower chair or bench in shower? Yes  Elevated toilet seat or a handicapped toilet? No  TIMED UP AND GO:  Was the test performed? No .   Cognitive Function:        10/25/2022    8:23 AM 10/12/2021    8:17 AM 10/06/2020    8:18 AM  6CIT Screen  What Year? 0 points 0 points 0 points  What month? 0 points 0 points 0 points  What time? 0 points 0 points   Count back from 20 0 points 0 points 0 points  Months in reverse 0 points 0 points 0 points  Repeat phrase 0 points 0 points 0 points  Total Score 0 points 0 points      Immunizations Immunization History  Administered Date(s) Administered   Fluad Quad(high Dose 65+) 08/30/2021   Influenza, High Dose Seasonal PF 10/26/2019   Influenza,inj,quad, With Preservative 05/16/2017, 09/15/2018, 08/17/2019   Influenza-Unspecified 10/15/2012, 09/06/2013, 09/12/2014, 09/26/2015, 09/24/2017, 09/25/2018, 08/11/2022   Moderna Sars-Covid-2 Vaccination 01/25/2020, 02/22/2020, 09/04/2020, 10/09/2020, 03/14/2021   PPD Test 09/12/2014   Pfizer Covid-19 Vaccine Bivalent Booster 49yrs & up 09/07/2021, 06/08/2022   Pneumococcal Conjugate,unspecified 09/16/2015   Pneumococcal Conjugate-13 01/23/2021   Pneumococcal Polysaccharide-23 04/29/2022   Pneumococcal-Unspecified 07/07/2013, 11/21/2014, 09/16/2015   Tdap 09/12/2014   Unspecified SARS-COV-2 Vaccination 01/25/2020, 02/13/2021   Zoster, Live 12/14/2013, 09/16/2017, 04/01/2018    TDAP status: Up to date  Flu Vaccine status: Up to date  Pneumococcal vaccine status: Up to date  Covid-19 vaccine status: Completed vaccines  Qualifies for Shingles Vaccine? Yes   Zostavax completed Yes   Shingrix Completed?: Yes  Screening Tests Health Maintenance  Topic Date Due   Zoster Vaccines- Shingrix (1 of 2) 11/06/2022 (Originally 05/28/1998)   Medicare Annual Wellness (AWV)  10/26/2023   MAMMOGRAM  08/10/2024   TETANUS/TDAP  09/12/2024   COLONOSCOPY (Pts 45-102yrs Insurance coverage will need to be confirmed)  01/06/2030   Pneumonia Vaccine 82+ Years old  Completed   INFLUENZA VACCINE  Completed   DEXA SCAN  Completed   COVID-19 Vaccine  Completed   Hepatitis C Screening  Completed   HPV VACCINES  Aged Out    Health Maintenance  There are no preventive care reminders to display for this patient.   Colorectal cancer screening: Type of screening: Colonoscopy. Completed 01/07/20. Repeat every 10 years  Mammogram 08/10/22 repeat every year   Bone Density status: Completed 08/24/21. Results reflect: Bone density  results: OSTEOPOROSIS. Repeat every 2 years.   Additional Screening:  Hepatitis C Screening: Completed 04/29/22  Vision Screening: Recommended annual ophthalmology exams for early detection of glaucoma and other disorders of the eye. Is the patient up to date with their annual eye exam?  Yes  Who is the provider or what is the name of the office in which the patient attends annual eye exams? Provider in [place] If pt is not established with a provider, would they like to be referred to a provider to establish care? No .   Dental Screening: Recommended annual dental exams for proper oral hygiene  Community Resource Referral / Chronic Care Management: CRR required this visit?  No   CCM required this visit?  No      Plan:     I have personally reviewed and noted the following in the patient's chart:   Medical and social history Use of alcohol, tobacco or illicit drugs  Current medications and supplements including opioid prescriptions. Patient is not currently taking opioid prescriptions. Functional ability and status Nutritional status Physical activity Advanced directives List of other physicians Hospitalizations, surgeries, and ER visits  in previous 12 months Vitals Screenings to include cognitive, depression, and falls Referrals and appointments  In addition, I have reviewed and discussed with patient certain preventive protocols, quality metrics, and best practice recommendations. A written personalized care plan for preventive services as well as general preventive health recommendations were provided to patient.     Marzella Schlein, LPN   [id number]   Nurse Notes: none

## 2022-10-25 NOTE — Telephone Encounter (Signed)
Patient states she had Mammogram 08/10/22, Flu Shot 08/11/22 and Eye Dr. Is Amanda Mathews appt. Scheduled for 03/17/23

## 2022-10-25 NOTE — Patient Instructions (Signed)
Amanda Mathews , Thank you for taking time to come for your Medicare Wellness Visit. I appreciate your ongoing commitment to your health goals. Please review the following plan we discussed and let me know if I can assist you in the future.   These are the goals we discussed:  Goals      Patient Stated     Lose weight and increase walking     Patient Stated     Continue going to the gym     Patient Stated     Lose 10 more lbs         This is a list of the screening recommended for you and due dates:  Health Maintenance  Topic Date Due   Zoster (Shingles) Vaccine (1 of 2) 11/06/2022*   Medicare Annual Wellness Visit  10/26/2023   Mammogram  08/10/2024   Tetanus Vaccine  09/12/2024   Colon Cancer Screening  01/06/2030   Pneumonia Vaccine  Completed   Flu Shot  Completed   DEXA scan (bone density measurement)  Completed   COVID-19 Vaccine  Completed   Hepatitis C Screening: USPSTF Recommendation to screen - Ages 67-79 yo.  Completed   HPV Vaccine  Aged Out  *Topic was postponed. The date shown is not the original due date.    Advanced directives: copies in chart   Conditions/risks identified: lose 10 more lbs   Next appointment: Follow up in one year for your annual wellness visit    Preventive Care 65 Years and Older, Female Preventive care refers to lifestyle choices and visits with your health care provider that can promote health and wellness. What does preventive care include? A yearly physical exam. This is also called an annual well check. Dental exams once or twice a year. Routine eye exams. Ask your health care provider how often you should have your eyes checked. Personal lifestyle choices, including: Daily care of your teeth and gums. Regular physical activity. Eating a healthy diet. Avoiding tobacco and drug use. Limiting alcohol use. Practicing safe sex. Taking low-dose aspirin every day. Taking vitamin and mineral supplements as recommended by your  health care provider. What happens during an annual well check? The services and screenings done by your health care provider during your annual well check will depend on your age, overall health, lifestyle risk factors, and family history of disease. Counseling  Your health care provider may ask you questions about your: Alcohol use. Tobacco use. Drug use. Emotional well-being. Home and relationship well-being. Sexual activity. Eating habits. History of falls. Memory and ability to understand (cognition). Work and work Astronomer. Reproductive health. Screening  You may have the following tests or measurements: Height, weight, and BMI. Blood pressure. Lipid and cholesterol levels. These may be checked every 5 years, or more frequently if you are over 19 years old. Skin check. Lung cancer screening. You may have this screening every year starting at age 62 if you have a 30-pack-year history of smoking and currently smoke or have quit within the past 15 years. Fecal occult blood test (FOBT) of the stool. You may have this test every year starting at age 66. Flexible sigmoidoscopy or colonoscopy. You may have a sigmoidoscopy every 5 years or a colonoscopy every 10 years starting at age 46. Hepatitis C blood test. Hepatitis B blood test. Sexually transmitted disease (STD) testing. Diabetes screening. This is done by checking your blood sugar (glucose) after you have not eaten for a while (fasting). You may have this done  every 1-3 years. Bone density scan. This is done to screen for osteoporosis. You may have this done starting at age 70. Mammogram. This may be done every 1-2 years. Talk to your health care provider about how often you should have regular mammograms. Talk with your health care provider about your test results, treatment options, and if necessary, the need for more tests. Vaccines  Your health care provider may recommend certain vaccines, such as: Influenza vaccine.  This is recommended every year. Tetanus, diphtheria, and acellular pertussis (Tdap, Td) vaccine. You may need a Td booster every 10 years. Zoster vaccine. You may need this after age 47. Pneumococcal 13-valent conjugate (PCV13) vaccine. One dose is recommended after age 59. Pneumococcal polysaccharide (PPSV23) vaccine. One dose is recommended after age 22. Talk to your health care provider about which screenings and vaccines you need and how often you need them. This information is not intended to replace advice given to you by your health care provider. Make sure you discuss any questions you have with your health care provider. Document Released: 12/29/2015 Document Revised: 08/21/2016 Document Reviewed: 10/03/2015 Elsevier Interactive Patient Education  2017 [employer].  Fall Prevention in the Home Falls can cause injuries. They can happen to people of all ages. There are many things you can do to make your home safe and to help prevent falls. What can I do on the outside of my home? Regularly fix the edges of walkways and driveways and fix any cracks. Remove anything that might make you trip as you walk through a door, such as a raised step or threshold. Trim any bushes or trees on the path to your home. Use bright outdoor lighting. Clear any walking paths of anything that might make someone trip, such as rocks or tools. Regularly check to see if handrails are loose or broken. Make sure that both sides of any steps have handrails. Any raised decks and porches should have guardrails on the edges. Have any leaves, snow, or ice cleared regularly. Use sand or salt on walking paths during winter. Clean up any spills in your garage right away. This includes oil or grease spills. What can I do in the bathroom? Use night lights. Install grab bars by the toilet and in the tub and shower. Do not use towel bars as grab bars. Use non-skid mats or decals in the tub or shower. If you need to sit  down in the shower, use a plastic, non-slip stool. Keep the floor dry. Clean up any water that spills on the floor as soon as it happens. Remove soap buildup in the tub or shower regularly. Attach bath mats securely with double-sided non-slip rug tape. Do not have throw rugs and other things on the floor that can make you trip. What can I do in the bedroom? Use night lights. Make sure that you have a light by your bed that is easy to reach. Do not use any sheets or blankets that are too big for your bed. They should not hang down onto the floor. Have a firm chair that has side arms. You can use this for support while you get dressed. Do not have throw rugs and other things on the floor that can make you trip. What can I do in the kitchen? Clean up any spills right away. Avoid walking on wet floors. Keep items that you use a lot in easy-to-reach places. If you need to reach something above you, use a strong step stool that has  a grab bar. Keep electrical cords out of the way. Do not use floor polish or wax that makes floors slippery. If you must use wax, use non-skid floor wax. Do not have throw rugs and other things on the floor that can make you trip. What can I do with my stairs? Do not leave any items on the stairs. Make sure that there are handrails on both sides of the stairs and use them. Fix handrails that are broken or loose. Make sure that handrails are as long as the stairways. Check any carpeting to make sure that it is firmly attached to the stairs. Fix any carpet that is loose or worn. Avoid having throw rugs at the top or bottom of the stairs. If you do have throw rugs, attach them to the floor with carpet tape. Make sure that you have a light switch at the top of the stairs and the bottom of the stairs. If you do not have them, ask someone to add them for you. What else can I do to help prevent falls? Wear shoes that: Do not have high heels. Have rubber bottoms. Are  comfortable and fit you well. Are closed at the toe. Do not wear sandals. If you use a stepladder: Make sure that it is fully opened. Do not climb a closed stepladder. Make sure that both sides of the stepladder are locked into place. Ask someone to hold it for you, if possible. Clearly mark and make sure that you can see: Any grab bars or handrails. First and last steps. Where the edge of each step is. Use tools that help you move around (mobility aids) if they are needed. These include: Canes. Walkers. Scooters. Crutches. Turn on the lights when you go into a dark area. Replace any light bulbs as soon as they burn out. Set up your furniture so you have a clear path. Avoid moving your furniture around. If any of your floors are uneven, fix them. If there are any pets around you, be aware of where they are. Review your medicines with your doctor. Some medicines can make you feel dizzy. This can increase your chance of falling. Ask your doctor what other things that you can do to help prevent falls. This information is not intended to replace advice given to you by your health care provider. Make sure you discuss any questions you have with your health care provider. Document Released: 09/28/2009 Document Revised: 05/09/2016 Document Reviewed: 01/06/2015 Elsevier Interactive Patient Education  2017 [employer].

## 2022-11-09 ENCOUNTER — Other Ambulatory Visit: Payer: Self-pay | Admitting: Family Medicine

## 2022-11-12 ENCOUNTER — Ambulatory Visit (INDEPENDENT_AMBULATORY_CARE_PROVIDER_SITE_OTHER): Payer: Medicare PPO

## 2022-11-12 DIAGNOSIS — M810 Age-related osteoporosis without current pathological fracture: Secondary | ICD-10-CM | POA: Diagnosis not present

## 2022-11-12 MED ORDER — DENOSUMAB 60 MG/ML ~~LOC~~ SOSY
60.0000 mg | PREFILLED_SYRINGE | Freq: Once | SUBCUTANEOUS | Status: AC
  Administered 2022-11-12: 60 mg via SUBCUTANEOUS

## 2022-11-12 NOTE — Progress Notes (Signed)
Pt here for Prolia injection for Dr [name]. Injection given in Right arm.  Pt tolerated well.

## 2022-11-14 ENCOUNTER — Ambulatory Visit: Payer: Medicare PPO | Admitting: Neurology

## 2022-11-18 ENCOUNTER — Other Ambulatory Visit: Payer: Self-pay | Admitting: Family Medicine

## 2022-11-25 DIAGNOSIS — R351 Nocturia: Secondary | ICD-10-CM | POA: Diagnosis not present

## 2022-11-25 DIAGNOSIS — M62838 Other muscle spasm: Secondary | ICD-10-CM | POA: Diagnosis not present

## 2022-11-25 DIAGNOSIS — R152 Fecal urgency: Secondary | ICD-10-CM | POA: Diagnosis not present

## 2022-11-25 DIAGNOSIS — M6281 Muscle weakness (generalized): Secondary | ICD-10-CM | POA: Diagnosis not present

## 2022-11-25 DIAGNOSIS — R159 Full incontinence of feces: Secondary | ICD-10-CM | POA: Diagnosis not present

## 2022-11-28 NOTE — Telephone Encounter (Signed)
Last Prolia inj 11/12/22 Next Prolia inj due 05/14/23 

## 2022-12-08 ENCOUNTER — Other Ambulatory Visit: Payer: Self-pay | Admitting: Family Medicine

## 2022-12-18 ENCOUNTER — Ambulatory Visit: Payer: Medicare PPO | Admitting: Physician Assistant

## 2022-12-18 VITALS — BP 122/78 | HR 75 | Temp 97.8°F | Ht 60.0 in | Wt 173.6 lb

## 2022-12-18 DIAGNOSIS — S0003XA Contusion of scalp, initial encounter: Secondary | ICD-10-CM | POA: Diagnosis not present

## 2022-12-18 DIAGNOSIS — J029 Acute pharyngitis, unspecified: Secondary | ICD-10-CM

## 2022-12-18 DIAGNOSIS — U071 COVID-19: Secondary | ICD-10-CM | POA: Diagnosis not present

## 2022-12-18 LAB — POC COVID19 BINAXNOW: SARS Coronavirus 2 Ag: POSITIVE — AB

## 2022-12-18 LAB — POCT INFLUENZA A/B
Influenza A, POC: NEGATIVE
Influenza B, POC: NEGATIVE

## 2022-12-18 LAB — POCT RAPID STREP A (OFFICE): Rapid Strep A Screen: NEGATIVE

## 2022-12-18 MED ORDER — MOLNUPIRAVIR EUA 200MG CAPSULE: 4.0000 | ORAL_CAPSULE | Freq: Two times a day (BID) | ORAL | 0 refills | Status: AC

## 2022-12-18 NOTE — Progress Notes (Signed)
Subjective:    Patient ID: Amanda Mathews, female    DOB: 1946-10-30, 77 y.o.   MRN: [id number]  Chief Complaint  Patient presents with   Sore Throat    Pt in office for possible strep throat; pt has cough, throat and chest is sore from cough; been around others with cold over holidays; pt also has fell and hit her head and wanting to have it looked at as well.    Sore Throat    Patient is in today for acute sick symptoms.   Symptom onset: 12/15/22, worse last night 12/17/22 Pertinent positives: Congestion, sore throat, coughing with rattle sound, didn't sleep well last night, felt feverish this morning Pertinent negatives: N/V/D, abd pain Treatments tried: Tylenol Vaccine status: UTD on flu and covid-19 vaccines  Sick exposure: family around the holidays   One week ago slipped and fell, hitting the R / back side of her head. Developed a goose-egg. Did not lose consciousness. No N/V/headache. No vision changes. Swelling is going down. Just wanted this checked. Not on blood thinner.    Past Medical History:  Diagnosis Date   Cataract    Osteoporosis    Sleep apnea    Thyroid disease     Past Surgical History:  Procedure Laterality Date   back stimulator      BUNIONECTOMY     CHOLECYSTECTOMY     FEMUR FRACTURE SURGERY     REPLACEMENT TOTAL KNEE BILATERAL Bilateral     Family History  Problem Relation Age of Onset   Hypertension Mother    Stroke Mother    Arthritis Sister    COPD Sister    Depression Sister    Diabetes Sister    Alcohol abuse Brother    Hypertension Sister     Social History   Tobacco Use   Smoking status: Never   Smokeless tobacco: Never  Vaping Use   Vaping Use: Never used  Substance Use Topics   Alcohol use: Never   Drug use: Never     Allergies  Allergen Reactions   Codeine Nausea And Vomiting and Nausea Only    Other reaction(s): vomiting   Hydromorphone Nausea Only and Nausea And Vomiting   Morphine Nausea Only and Other  (See Comments)    Hallucinations  Other reaction(s): Delusions (intolerance), hallucinations, Other (See Comments) Hallucinations     Ropinirole     hallucinations Other reaction(s): Delusions (intolerance) hallucinations    Review of Systems NEGATIVE UNLESS OTHERWISE INDICATED IN HPI      Objective:     BP 122/78 (BP Location: Left Arm)   Pulse 75   Temp 97.8 F (36.6 C) (Temporal)   Ht 5' (1.524 m)   Wt 173 lb 9.6 oz (78.7 kg)   SpO2 98%   BMI 33.90 kg/m   Wt Readings from Last 3 Encounters:  12/18/22 173 lb 9.6 oz (78.7 kg)  10/25/22 171 lb (77.6 kg)  09/24/22 171 lb 9.6 oz (77.8 kg)    BP Readings from Last 3 Encounters:  12/18/22 122/78  09/24/22 129/75  09/19/22 108/69     Physical Exam Vitals and nursing note reviewed.  Constitutional:      General: She is not in acute distress.    Appearance: Normal appearance. She is ill-appearing.  HENT:     Head: Normocephalic.      Right Ear: Tympanic membrane, ear canal and external ear normal.     Left Ear: Tympanic membrane, ear canal and external ear  normal.     Nose: Congestion present.     Mouth/Throat:     Mouth: Mucous membranes are moist.     Pharynx: No oropharyngeal exudate or posterior oropharyngeal erythema.  Eyes:     Extraocular Movements: Extraocular movements intact.     Conjunctiva/sclera: Conjunctivae normal.     Pupils: Pupils are equal, round, and reactive to light.  Cardiovascular:     Rate and Rhythm: Normal rate and regular rhythm.     Pulses: Normal pulses.     Heart sounds: Normal heart sounds. No murmur heard. Pulmonary:     Effort: Pulmonary effort is normal. No respiratory distress.     Breath sounds: Normal breath sounds. No wheezing.     Comments: coughing Musculoskeletal:     Cervical back: Normal range of motion.  Skin:    General: Skin is warm.  Neurological:     Mental Status: She is alert and oriented to person, place, and time.  Psychiatric:        Mood and  Affect: Mood normal.        Behavior: Behavior normal.        Assessment & Plan:  COVID-19 -     POCT rapid strep A -     POC COVID-19 BinaxNow -     POCT Influenza A/B  Sore throat -     POCT rapid strep A  Contusion of scalp, initial encounter   Diagnosis of COVID-19 confirmed via POC testing in office. POC strep A and flu negative. We discussed current algorithm recommendations for prescribing outpatient antivirals.  Recommended starting on molnupiravir at this time. Pt aware of risks vs benefits and possible adverse reactions.  Advised self-isolation at home for the next 5 days and then masking around others for at least an additional 5 days.  Treat supportively at this time including sleeping prone, deep breathing exercises, pushing fluids, walking every few hours, vitamins C and D, and Tylenol or ibuprofen as needed.  The patient understands that COVID-19 illness can wax and wane.  Should the symptoms acutely worsen or patient starts to experience sudden shortness of breath, chest pain, severe weakness, the patient will go straight to the emergency department.  Also advised home pulse oximetry monitoring and for any reading consistently under 92%, should also report to the emergency department.  The patient will continue to keep [place] updated.  Also reassured patient about scalp contusion - no red flags. Not on blood thinner. No indication for imaging. Continue to ice as needed.    Return if symptoms worsen or fail to improve.  This note was prepared with assistance of Systems analyst. Occasional wrong-word or sound-a-like substitutions may have occurred due to the inherent limitations of voice recognition software.     Ilir Mahrt M Clem Wisenbaker, PA-C

## 2022-12-18 NOTE — Patient Instructions (Signed)
Take the molnupiravir as directed.

## 2022-12-29 ENCOUNTER — Other Ambulatory Visit: Payer: Self-pay | Admitting: Family Medicine

## 2023-01-17 ENCOUNTER — Other Ambulatory Visit: Payer: Self-pay | Admitting: Family Medicine

## 2023-01-20 DIAGNOSIS — M1990 Unspecified osteoarthritis, unspecified site: Secondary | ICD-10-CM | POA: Diagnosis not present

## 2023-01-20 DIAGNOSIS — M2042 Other hammer toe(s) (acquired), left foot: Secondary | ICD-10-CM | POA: Diagnosis not present

## 2023-01-20 DIAGNOSIS — M2170 Unequal limb length (acquired), unspecified site: Secondary | ICD-10-CM | POA: Diagnosis not present

## 2023-01-20 DIAGNOSIS — M2041 Other hammer toe(s) (acquired), right foot: Secondary | ICD-10-CM | POA: Diagnosis not present

## 2023-01-20 DIAGNOSIS — R2689 Other abnormalities of gait and mobility: Secondary | ICD-10-CM | POA: Diagnosis not present

## 2023-01-20 DIAGNOSIS — L909 Atrophic disorder of skin, unspecified: Secondary | ICD-10-CM | POA: Diagnosis not present

## 2023-01-20 DIAGNOSIS — M5417 Radiculopathy, lumbosacral region: Secondary | ICD-10-CM | POA: Diagnosis not present

## 2023-01-22 DIAGNOSIS — M6281 Muscle weakness (generalized): Secondary | ICD-10-CM | POA: Diagnosis not present

## 2023-01-22 DIAGNOSIS — M62838 Other muscle spasm: Secondary | ICD-10-CM | POA: Diagnosis not present

## 2023-01-22 DIAGNOSIS — R3914 Feeling of incomplete bladder emptying: Secondary | ICD-10-CM | POA: Diagnosis not present

## 2023-01-22 DIAGNOSIS — R351 Nocturia: Secondary | ICD-10-CM | POA: Diagnosis not present

## 2023-01-30 ENCOUNTER — Other Ambulatory Visit: Payer: Self-pay | Admitting: Family Medicine

## 2023-02-13 ENCOUNTER — Encounter: Payer: Self-pay | Admitting: Radiology

## 2023-02-14 ENCOUNTER — Other Ambulatory Visit: Payer: Self-pay | Admitting: Family Medicine

## 2023-02-21 DIAGNOSIS — G500 Trigeminal neuralgia: Secondary | ICD-10-CM | POA: Diagnosis not present

## 2023-03-13 ENCOUNTER — Other Ambulatory Visit: Payer: Self-pay | Admitting: Family Medicine

## 2023-03-15 ENCOUNTER — Other Ambulatory Visit: Payer: Self-pay | Admitting: Family Medicine

## 2023-03-17 ENCOUNTER — Other Ambulatory Visit: Payer: Self-pay | Admitting: Family

## 2023-03-17 DIAGNOSIS — S0511XA Contusion of eyeball and orbital tissues, right eye, initial encounter: Secondary | ICD-10-CM | POA: Diagnosis not present

## 2023-03-17 DIAGNOSIS — H5213 Myopia, bilateral: Secondary | ICD-10-CM | POA: Diagnosis not present

## 2023-03-17 DIAGNOSIS — H524 Presbyopia: Secondary | ICD-10-CM | POA: Diagnosis not present

## 2023-03-17 DIAGNOSIS — Z961 Presence of intraocular lens: Secondary | ICD-10-CM | POA: Diagnosis not present

## 2023-03-17 DIAGNOSIS — H52203 Unspecified astigmatism, bilateral: Secondary | ICD-10-CM | POA: Diagnosis not present

## 2023-03-17 DIAGNOSIS — H04123 Dry eye syndrome of bilateral lacrimal glands: Secondary | ICD-10-CM | POA: Diagnosis not present

## 2023-03-17 DIAGNOSIS — R0982 Postnasal drip: Secondary | ICD-10-CM

## 2023-03-19 ENCOUNTER — Other Ambulatory Visit: Payer: Self-pay | Admitting: Family Medicine

## 2023-03-19 NOTE — Telephone Encounter (Signed)
Last OV 08/08/22 dx. Hypothyroidism

## 2023-03-20 ENCOUNTER — Encounter: Payer: Self-pay | Admitting: Family Medicine

## 2023-03-20 ENCOUNTER — Ambulatory Visit: Payer: Medicare PPO | Admitting: Family Medicine

## 2023-03-20 VITALS — BP 114/70 | HR 67 | Temp 97.7°F | Ht 60.0 in | Wt 181.0 lb

## 2023-03-20 DIAGNOSIS — E039 Hypothyroidism, unspecified: Secondary | ICD-10-CM

## 2023-03-20 DIAGNOSIS — M545 Low back pain, unspecified: Secondary | ICD-10-CM

## 2023-03-20 DIAGNOSIS — E785 Hyperlipidemia, unspecified: Secondary | ICD-10-CM | POA: Diagnosis not present

## 2023-03-20 DIAGNOSIS — G8929 Other chronic pain: Secondary | ICD-10-CM | POA: Diagnosis not present

## 2023-03-20 DIAGNOSIS — R739 Hyperglycemia, unspecified: Secondary | ICD-10-CM | POA: Diagnosis not present

## 2023-03-20 DIAGNOSIS — I10 Essential (primary) hypertension: Secondary | ICD-10-CM | POA: Diagnosis not present

## 2023-03-20 LAB — COMPREHENSIVE METABOLIC PANEL
ALT: 11 U/L (ref 0–35)
AST: 15 U/L (ref 0–37)
Albumin: 3.9 g/dL (ref 3.5–5.2)
Alkaline Phosphatase: 104 U/L (ref 39–117)
BUN: 18 mg/dL (ref 6–23)
CO2: 29 mEq/L (ref 19–32)
Calcium: 8.8 mg/dL (ref 8.4–10.5)
Chloride: 103 mEq/L (ref 96–112)
Creatinine, Ser: 0.86 mg/dL (ref 0.40–1.20)
GFR: 66.37 mL/min (ref >60.00)
Glucose, Bld: 93 mg/dL (ref 70–99)
Potassium: 3.6 mEq/L (ref 3.5–5.1)
Sodium: 140 mEq/L (ref 135–145)
Total Bilirubin: 0.5 mg/dL (ref 0.2–1.2)
Total Protein: 6 g/dL (ref 6.0–8.3)

## 2023-03-20 LAB — LIPID PANEL
Cholesterol: 200 mg/dL (ref 0–200)
HDL: 70.8 mg/dL
LDL Cholesterol: 113 mg/dL — ABNORMAL HIGH (ref 0–99)
NonHDL: 128.75
Total CHOL/HDL Ratio: 3
Triglycerides: 78 mg/dL (ref 0.0–149.0)
VLDL: 15.6 mg/dL (ref 0.0–40.0)

## 2023-03-20 LAB — CBC
HCT: 39.2 % (ref 36.0–46.0)
Hemoglobin: 13.5 g/dL (ref 12.0–15.0)
MCHC: 34.4 g/dL (ref 30.0–36.0)
MCV: 91.9 fl (ref 78.0–100.0)
Platelets: 186 10*3/uL (ref 150.0–400.0)
RBC: 4.26 Mil/uL (ref 3.87–5.11)
RDW: 13.6 % (ref 11.5–15.5)
WBC: 4.9 10*3/uL (ref 4.0–10.5)

## 2023-03-20 LAB — TSH: TSH: 1.24 u[IU]/mL (ref 0.35–5.50)

## 2023-03-20 LAB — HEMOGLOBIN A1C: Hgb A1c MFr Bld: 5.2 % (ref 4.6–6.5)

## 2023-03-20 MED ORDER — LEVOTHYROXINE SODIUM 150 MCG PO TABS: ORAL_TABLET | ORAL | 1 refills | Status: DC

## 2023-03-20 NOTE — Patient Instructions (Addendum)
It was very nice to see you today!  We will check blood work today.  Let us know if your pain is not improving in the next couple of weeks.  Take care, Dr Jerline Pain  PLEASE NOTE:  If you had any lab tests, please let us know if you have not heard back within a few days. You may see your results on mychart before we have a chance to review them but we will give you a call once they are reviewed by [place].   If we ordered any referrals today, please let us know if you have not heard from their office within the next week.   If you had any urgent prescriptions sent in today, please check with the pharmacy within an hour of our visit to make sure the prescription was transmitted appropriately.   Please try these tips to maintain a healthy lifestyle:  Eat at least 3 REAL meals and 1-2 snacks per day.  Aim for no more than 5 hours between eating.  If you eat breakfast, please do so within one hour of getting up.   Each meal should contain half fruits/vegetables, one quarter protein, and one quarter carbs (no bigger than a computer mouse)  Cut down on sweet beverages. This includes juice, soda, and sweet tea.   Drink at least 1 glass of water with each meal and aim for at least 8 glasses per day  Exercise at least 150 minutes every week.

## 2023-03-20 NOTE — Assessment & Plan Note (Signed)
Check labs.  Continue Synthroid 150 mcg daily.

## 2023-03-20 NOTE — Assessment & Plan Note (Signed)
Initially elevated however at goal on recheck on HCTZ 12.5 once daily.

## 2023-03-20 NOTE — Assessment & Plan Note (Signed)
Continue management per management and neurosurgery.

## 2023-03-20 NOTE — Assessment & Plan Note (Signed)
On simvastatin 20 mg daily Check lipids 

## 2023-03-20 NOTE — Progress Notes (Signed)
   Amanda Mathews is a 77 y.o. female who presents today for an office visit.  Assessment/Plan:  New/Acute Problems: Fall  She does have some ecchymosis on bilateral knees and face however otherwise exam today is reassuring without any concerns for fracture or significant soft tissue injury.  It is reassuring symptoms are improving the last couple of days.  Given her reassuring exam today do not think we need to check any imaging.  She will continue with ice as tolerated.  We did discuss referral to PT however she declined.  Discussed reasons to return to care.  Follow-up as needed.  Chronic Problems Addressed Today: Hypothyroidism Check labs.  Continue Synthroid 150 mcg daily.  Essential hypertension Initially elevated however at goal on recheck on HCTZ 12.5 once daily.   Chronic low back pain Continue management per management and neurosurgery.  Dyslipidemia On simvastatin 20 mg daily.  Check lipids.     Subjective:  HPI:  See A/P for status of chronic conditions.  She needs medication refill today.  She fell a few days ago while at her optometrist. Tripped over carpet. Landed on right side. No loss of consciousness. She did think she struck her right eye on her cane and now has a black eye.  She did land on her knees and has some bruising there as well.  Mostly has pain located to the upper part of her right arm.  She has good range of motion.  Same does seem to be improving.  No weakness.  No numbness.        Objective:  Physical Exam: BP 114/70   Pulse 67   Temp 97.7 F (36.5 C) (Temporal)   Ht 5' (1.524 m)   Wt 181 lb (82.1 kg)   SpO2 96%   BMI 35.35 kg/m   Gen: No acute distress, resting comfortably CV: Regular rate and rhythm with no murmurs appreciated Pulm: Normal work of breathing, clear to auscultation bilaterally with no crackles, wheezes, or rhonchi MSK: - Right Arm: No deformities.  Full range of motion throughout.  Normal rotator cuff testing.   Negative Neer and Hawkins test. - Legs: Ecchymosis to bilateral knees.  Full range of motion bilaterally.  - Face: Periorbital ecchymosis on right eye.  Mildly tender to palpation. Neuro: Grossly normal, moves all extremities Psych: Normal affect and thought content      Adryan Shin M. Jerline Pain, MD 03/20/2023 8:19 AM

## 2023-03-24 NOTE — Progress Notes (Signed)
Please inform patient of the following:  Her "bad" cholesterol is up a little but over all stable. The rest of her labs are all NORMAL. WE can increase the dose of her simvastatin to 40mg  daily if she is agreeable. Do not need to make any other changes to her treatment plan at this time. She should keep working on diet and exercise and we can recheck in a year.  Amanda Mathews. [name], MD 03/24/2023 1:17 PM

## 2023-03-25 DIAGNOSIS — Z85828 Personal history of other malignant neoplasm of skin: Secondary | ICD-10-CM | POA: Diagnosis not present

## 2023-03-25 DIAGNOSIS — Z08 Encounter for follow-up examination after completed treatment for malignant neoplasm: Secondary | ICD-10-CM | POA: Diagnosis not present

## 2023-03-25 DIAGNOSIS — L820 Inflamed seborrheic keratosis: Secondary | ICD-10-CM | POA: Diagnosis not present

## 2023-03-25 DIAGNOSIS — L720 Epidermal cyst: Secondary | ICD-10-CM | POA: Diagnosis not present

## 2023-03-25 DIAGNOSIS — M545 Low back pain, unspecified: Secondary | ICD-10-CM | POA: Diagnosis not present

## 2023-03-25 DIAGNOSIS — L853 Xerosis cutis: Secondary | ICD-10-CM | POA: Diagnosis not present

## 2023-03-25 DIAGNOSIS — D225 Melanocytic nevi of trunk: Secondary | ICD-10-CM | POA: Diagnosis not present

## 2023-03-25 DIAGNOSIS — G8929 Other chronic pain: Secondary | ICD-10-CM | POA: Diagnosis not present

## 2023-03-25 DIAGNOSIS — M47816 Spondylosis without myelopathy or radiculopathy, lumbar region: Secondary | ICD-10-CM | POA: Diagnosis not present

## 2023-03-25 DIAGNOSIS — L570 Actinic keratosis: Secondary | ICD-10-CM | POA: Diagnosis not present

## 2023-03-26 ENCOUNTER — Other Ambulatory Visit: Payer: Self-pay | Admitting: *Deleted

## 2023-03-26 MED ORDER — SIMVASTATIN 40 MG PO TABS: 40.0000 mg | ORAL_TABLET | Freq: Every day | ORAL | 3 refills | Status: DC

## 2023-03-27 DIAGNOSIS — R350 Frequency of micturition: Secondary | ICD-10-CM | POA: Diagnosis not present

## 2023-04-15 ENCOUNTER — Telehealth: Payer: Self-pay

## 2023-04-15 NOTE — Telephone Encounter (Addendum)
New encounter created for [facility]

## 2023-04-17 ENCOUNTER — Other Ambulatory Visit (HOSPITAL_COMMUNITY): Payer: Self-pay

## 2023-04-17 ENCOUNTER — Ambulatory Visit: Payer: Medicare PPO | Admitting: Family Medicine

## 2023-04-17 ENCOUNTER — Encounter: Payer: Self-pay | Admitting: Family Medicine

## 2023-04-17 VITALS — BP 117/74 | HR 61 | Temp 97.5°F | Ht 60.0 in | Wt 179.6 lb

## 2023-04-17 DIAGNOSIS — M79601 Pain in right arm: Secondary | ICD-10-CM | POA: Diagnosis not present

## 2023-04-17 DIAGNOSIS — R600 Localized edema: Secondary | ICD-10-CM

## 2023-04-17 DIAGNOSIS — M545 Low back pain, unspecified: Secondary | ICD-10-CM | POA: Diagnosis not present

## 2023-04-17 DIAGNOSIS — I10 Essential (primary) hypertension: Secondary | ICD-10-CM

## 2023-04-17 DIAGNOSIS — M47816 Spondylosis without myelopathy or radiculopathy, lumbar region: Secondary | ICD-10-CM | POA: Diagnosis not present

## 2023-04-17 NOTE — Patient Instructions (Addendum)
It was very nice to see you today!  We will refer you to see physical therapy.  Will check an x-ray in your shoulder.  Let us know if not improving.  Return if symptoms worsen or fail to improve.   Take care, Dr [name]  PLEASE NOTE:  If you had any lab tests, please let us know if you have not heard back within a few days. You may see your results on mychart before we have a chance to review them but we will give you a call once they are reviewed by [place].   If we ordered any referrals today, please let us know if you have not heard from their office within the next week.   If you had any urgent prescriptions sent in today, please check with the pharmacy within an hour of our visit to make sure the prescription was transmitted appropriately.   Please try these tips to maintain a healthy lifestyle:  Eat at least 3 REAL meals and 1-2 snacks per day.  Aim for no more than 5 hours between eating.  If you eat breakfast, please do so within one hour of getting up.   Each meal should contain half fruits/vegetables, one quarter protein, and one quarter carbs (no bigger than a computer mouse)  Cut down on sweet beverages. This includes juice, soda, and sweet tea.   Drink at least 1 glass of water with each meal and aim for at least 8 glasses per day  Exercise at least 150 minutes every week.

## 2023-04-17 NOTE — Telephone Encounter (Signed)
Pt ready for scheduling for Prolia on or after : 04/17/23  Out-of-pocket cost due at time of visit: $40  Primary: [employer] Prolia co-insurance: $40 Admin fee co-insurance: $0  Secondary: --- Prolia co-insurance:  Admin fee co-insurance:   Medical Benefit Details: Date Benefits were checked: 04/15/23 Deductible: NO/ Coinsurance: $40/ Admin Fee: $0  Prior Auth: APPROVED PA# [id number] Expiration Date: 12/16/2023   Pharmacy benefit: Copay $64 If patient wants fill through the pharmacy benefit please send prescription to: [facility], and include estimated need by date in rx notes. Pharmacy will ship medication directly to the office.  Patient NOT eligible for Prolia Copay Card. Copay Card can make patient's cost as little as $25. Link to apply: [email or web address]  ** This summary of benefits is an estimation of the patient's out-of-pocket cost. Exact cost may very based on individual plan coverage.

## 2023-04-17 NOTE — Telephone Encounter (Signed)
Called [employer] at [phone].  No deductible, $40 office copay, PA on file (Expires 12/16/23). Ref #: [id number]

## 2023-04-17 NOTE — Assessment & Plan Note (Signed)
No red flags.  Likely secondary to venous insufficiency.  We discussed conservative measures including salt avoidance, leg elevation, and compression stockings as needed.  Symptoms are currently mild and not very bothersome.  Would avoid starting any additional diuretics at this point given her mild symptoms and potential for side effects.  We discussed reasons to return to care.

## 2023-04-17 NOTE — Progress Notes (Signed)
   Amanda Mathews is a 77 y.o. female who presents today for an office visit.  Assessment/Plan:  New/Acute Problems: Right Arm Pain Potentially rotator cuff strain though given that symptoms have been persistent for the last month or so after her fall we will check x-ray to further evaluate.  She is agreeable to see physical therapy and we will place referral today.  She can use Tylenol and/or ibuprofen as needed.  She will let us know if not improving in the next 2 weeks and will consider referral to orthopedics or sports medicine at that time.  Chronic Problems Addressed Today: Essential hypertension Blood pressure at goal today on HCTZ 12.5 mg once daily.  Leg edema No red flags.  Likely secondary to venous insufficiency.  We discussed conservative measures including salt avoidance, leg elevation, and compression stockings as needed.  Symptoms are currently mild and not very bothersome.  Would avoid starting any additional diuretics at this point given her mild symptoms and potential for side effects.  We discussed reasons to return to care.     Subjective:  HPI:  See A/p for status of chronic conditions.  Her main concern today is right arm pain.  Started a month ago after falling. She fell after tripping on carpet and landed on her right side.  Located in right arm. Worse with certain motions. Hard for her to reach behind her back. No medications tried.   She is also had ongoing issues with bilateral leg swelling.  This has been present for quite a while.  Overall seems to be stable.  Does have some improvement with leg elevation however symptoms are persistent.       Objective:  Physical Exam: BP 117/74   Pulse 61   Temp (!) 97.5 F (36.4 C) (Temporal)   Ht 5' (1.524 m)   Wt 179 lb 9.6 oz (81.5 kg)   SpO2 98%   BMI 35.08 kg/m   Gen: No acute distress, resting comfortably MSK: - Right Arm: No deformities.  Full range of motion throughout.  Pain and weakness with  resisted supraspinatus testing.  Normal internal/external rotation.  Neurovascular intact distally. - Legs: Trace pretibial edema bilaterally. Neuro: Grossly normal, moves all extremities Psych: Normal affect and thought content      Leveon Pelzer M. [name], MD 04/17/2023 7:51 AM

## 2023-04-17 NOTE — Assessment & Plan Note (Signed)
Blood pressure at goal today on HCTZ 12.5 mg once daily.

## 2023-04-19 ENCOUNTER — Other Ambulatory Visit: Payer: Self-pay | Admitting: Family Medicine

## 2023-04-21 ENCOUNTER — Ambulatory Visit (HOSPITAL_COMMUNITY)
Admission: RE | Admit: 2023-04-21 | Discharge: 2023-04-21 | Disposition: A | Discharge status: Home / Self Care | Payer: Medicare PPO | Source: Ambulatory Visit | Attending: Family Medicine | Admitting: Family Medicine

## 2023-04-21 DIAGNOSIS — M79601 Pain in right arm: Secondary | ICD-10-CM | POA: Diagnosis not present

## 2023-04-21 DIAGNOSIS — M25511 Pain in right shoulder: Secondary | ICD-10-CM | POA: Diagnosis not present

## 2023-04-22 NOTE — Progress Notes (Signed)
Her x-ray does not show any abnormalities in her right shoulder.  No fracture or dislocation.  She does have an fracture on her right fourth rib.  It is not clear if this was old or from her recent fall.  Do not think that this should cause her any other issues and do not need to do any other testing for this at this point.  Would like for her to continue with physical therapy as we discussed at her visit and let us know if her symptoms or not improving.

## 2023-04-23 DIAGNOSIS — N8110 Cystocele, unspecified: Secondary | ICD-10-CM | POA: Diagnosis not present

## 2023-04-23 DIAGNOSIS — R152 Fecal urgency: Secondary | ICD-10-CM | POA: Diagnosis not present

## 2023-04-23 DIAGNOSIS — R159 Full incontinence of feces: Secondary | ICD-10-CM | POA: Diagnosis not present

## 2023-04-23 DIAGNOSIS — M62838 Other muscle spasm: Secondary | ICD-10-CM | POA: Diagnosis not present

## 2023-04-23 DIAGNOSIS — R351 Nocturia: Secondary | ICD-10-CM | POA: Diagnosis not present

## 2023-04-23 DIAGNOSIS — M6281 Muscle weakness (generalized): Secondary | ICD-10-CM | POA: Diagnosis not present

## 2023-04-27 ENCOUNTER — Encounter (HOSPITAL_COMMUNITY): Payer: Self-pay

## 2023-04-27 ENCOUNTER — Emergency Department (HOSPITAL_COMMUNITY): Payer: Medicare PPO

## 2023-04-27 ENCOUNTER — Other Ambulatory Visit: Payer: Self-pay

## 2023-04-27 ENCOUNTER — Emergency Department (HOSPITAL_COMMUNITY)
Admission: EM | Admit: 2023-04-27 | Discharge: 2023-04-27 | Disposition: A | Discharge status: Home / Self Care | Payer: Medicare PPO | Attending: Emergency Medicine | Admitting: Emergency Medicine

## 2023-04-27 DIAGNOSIS — E039 Hypothyroidism, unspecified: Secondary | ICD-10-CM | POA: Diagnosis not present

## 2023-04-27 DIAGNOSIS — Z23 Encounter for immunization: Secondary | ICD-10-CM | POA: Insufficient documentation

## 2023-04-27 DIAGNOSIS — S0990XA Unspecified injury of head, initial encounter: Secondary | ICD-10-CM | POA: Insufficient documentation

## 2023-04-27 DIAGNOSIS — W01190A Fall on same level from slipping, tripping and stumbling with subsequent striking against furniture, initial encounter: Secondary | ICD-10-CM | POA: Diagnosis not present

## 2023-04-27 DIAGNOSIS — S01511A Laceration without foreign body of lip, initial encounter: Secondary | ICD-10-CM | POA: Insufficient documentation

## 2023-04-27 DIAGNOSIS — G500 Trigeminal neuralgia: Secondary | ICD-10-CM | POA: Insufficient documentation

## 2023-04-27 DIAGNOSIS — I10 Essential (primary) hypertension: Secondary | ICD-10-CM | POA: Insufficient documentation

## 2023-04-27 DIAGNOSIS — S0993XA Unspecified injury of face, initial encounter: Secondary | ICD-10-CM | POA: Diagnosis not present

## 2023-04-27 DIAGNOSIS — Z79899 Other long term (current) drug therapy: Secondary | ICD-10-CM | POA: Diagnosis not present

## 2023-04-27 DIAGNOSIS — S0181XA Laceration without foreign body of other part of head, initial encounter: Secondary | ICD-10-CM

## 2023-04-27 DIAGNOSIS — S199XXA Unspecified injury of neck, initial encounter: Secondary | ICD-10-CM | POA: Diagnosis not present

## 2023-04-27 DIAGNOSIS — M48061 Spinal stenosis, lumbar region without neurogenic claudication: Secondary | ICD-10-CM | POA: Insufficient documentation

## 2023-04-27 DIAGNOSIS — R0781 Pleurodynia: Secondary | ICD-10-CM | POA: Diagnosis not present

## 2023-04-27 DIAGNOSIS — S2241XA Multiple fractures of ribs, right side, initial encounter for closed fracture: Secondary | ICD-10-CM | POA: Diagnosis not present

## 2023-04-27 DIAGNOSIS — W19XXXA Unspecified fall, initial encounter: Secondary | ICD-10-CM

## 2023-04-27 MED ORDER — TETANUS-DIPHTH-ACELL PERTUSSIS 5-2.5-18.5 LF-MCG/0.5 IM SUSY
0.5000 mL | PREFILLED_SYRINGE | Freq: Once | INTRAMUSCULAR | Status: AC
  Administered 2023-04-27: 0.5 mL via INTRAMUSCULAR
  Filled 2023-04-27: qty 0.5

## 2023-04-27 MED ORDER — LIDOCAINE-EPINEPHRINE (PF) 1 %-1:200000 IJ SOLN
10.0000 mL | Freq: Once | INTRAMUSCULAR | Status: AC
  Administered 2023-04-27: 10 mL via INTRADERMAL
  Filled 2023-04-27: qty 30

## 2023-04-27 NOTE — ED Provider Notes (Signed)
[place] [facility]MENT AT [facility] Provider Note   CSN: [id number] Arrival date & time: 04/27/23  0559     History  Chief Complaint  Patient presents with   Amanda Mathews    Amanda Mathews is a 77 y.o. female with HTN, hypothyroidism, OSA on CPAP,  presents with fall.   Patient presents with her husband from home for a fall. Patient reports she was in bed and reaching for her phone when she slid off of the bed; reports she hit the right side of her face on the nightstand and was bleeding from her right lower lip. Denies LOC.  She endorses pain in the right side of her chest and underneath toward the right side of her back.  She denies any headache, chest pain, abdominal pain, pain in her pelvis or lower extremities.  She does endorse mild pain to the right side of her neck but denies any midline pain.  Reports she had a chest x-ray recently and was told she has a cracked rib on the R side. She also recently had x-rays of your right shoulder for chronic right shoulder pain which were negative for any fracture or dislocation and she is planning to start physical therapy for the right shoulder. Otherwise has been in her NSOH.     Fall       Home Medications Prior to Admission medications   Medication Sig Start Date End Date Taking? Authorizing Provider  ammonium lactate (AMLACTIN) 12 % cream APPLY THIN COAT TO AFFECTED AREAS & RUB IN TWICE A DAY IN THE MORNING & IN THE EVENING    [provider]  Calcium Carb-Cholecalciferol (CALCIUM 600-D PO) Take 600 mg by mouth daily.    [provider]  carbamazepine (TEGRETOL) 200 MG tablet Take 200 mg by mouth 2 (two) times daily. 5 times daily 200 mg    [provider]  Cholecalciferol (VITAMIN D3) 125 MCG (5000 UT) CAPS Take 5,000 Units by mouth daily.    [provider]  denosumab (PROLIA) 60 MG/ML SOSY injection Inject 60 mg into the skin every 6 (six) months.    [provider]   gabapentin (NEURONTIN) 300 MG capsule TAKE 1 CAPSULE BY MOUTH THREE TIMES A DAY Patient taking differently: Twice a day 06/29/21   Ardith Dark, MD  hydrochlorothiazide (HYDRODIURIL) 25 MG tablet TAKE 1 TABLET (25 MG TOTAL) BY MOUTH DAILY. 09/02/22   Ardith Dark, MD  hydrocortisone 2.5 % cream Apply topically daily as needed. 03/26/22   [provider]  levocetirizine (XYZAL) 5 MG tablet TAKE 1 TABLET BY MOUTH EVERY DAY IN THE EVENING 03/17/23   Dulce Sellar, NP  levothyroxine (SYNTHROID) 150 MCG tablet TAKE 1 TABLET BY MOUTH EVERY DAY BEFORE BREAKFAST 03/20/23   Ardith Dark, MD  MAGNESIUM PO Take 1 tablet by mouth 2 (two) times daily. 250 mg    [provider]  meloxicam (MOBIC) 7.5 MG tablet Take 1 tablet (7.5 mg total) by mouth daily. Please schedule an appt with Dr. [name] for further refills [phone] 02/14/23   Ardith Dark, MD  methocarbamol (ROBAXIN) 750 MG tablet TAKE 1 TABLET BY MOUTH TWICE A DAY AS NEEDED FOR MUSCLE SPASMS 04/21/23   Ardith Dark, MD  nystatin cream (MYCOSTATIN) nystatin 100,000 unit/gram topical cream    [provider]  potassium chloride SA (KLOR-CON) 20 MEQ tablet Take 20 mEq by mouth daily.    [provider]  simvastatin (ZOCOR) 40  MG tablet Take 1 tablet (40 mg total) by mouth at bedtime. 03/26/23   Ardith Dark, MD  triamcinolone cream (KENALOG) 0.1 % MIX WITH NYSTATIN AND APPLY A THIN LAYER TO THE AFFECTED AREA TWICE DAILY AS NEEDED    [provider]      Allergies    Codeine, Hydromorphone, Morphine, and Ropinirole    Review of Systems   Review of Systems Review of systems Negative for LOC.  A 10 point review of systems was performed and is negative unless otherwise reported in HPI.  Physical Exam Updated Vital Signs BP (!) 166/97 (BP Location: Left Arm)   Pulse 93   Temp 98 F (36.7 C) (Oral)   Resp 18   Ht 5' (1.524 m)   Wt 81.5 kg   SpO2 96%   BMI 35.08 kg/m  Physical Exam   PRIMARY SURVEY  Airway Airway intact  Breathing Bilateral breath sounds  Circulation Carotid/femoral pulses 2+ intact bilaterally  GCS E =  4 V =  5 M =  6 Total = 15  Environment All clothes removed      SECONDARY SURVEY  Gen: -NAD  HEENT: -Head: NCAT. Scalp is clear of lacerations or wounds. Skull is clear of deformities or depressions -Forehead: Normal -Midface: Stable -Eyes: No visible injury to eyelids or eye, PERRL, EOMI -Nose: No gross deformities, no septal hematoma -Mouth: 1.5 cm linear laceration just below the lateral lower lip on the right. Hemostatic. Not through-and-through. No injuries to tongue or teeth. No trismus or malposition. Dentures in place.  -Ears: No auricular hematoma -Neck: Trachea is midline, no distended neck veins  Chest: -No tenderness, deformities, bruising or crepitus to clavicles or chest -Normal chest expansion -Normal heart sounds, S1/S2 normal, no m/r/g -No wheezes, rales, rhonchi  Abdomen: -No tenderness, bruising or penetrating injury  Pelvis: -Pelvis is stable and non-tender  Extremities: Right Upper Extremity: -No point tenderness, deformity or other signs of injury -Radial pulse intact RUE, cap refill good -Normal sensation -Normal ROM, good strength Left Upper Extremity: -No point tenderness, deformity or other signs of injury -Radial pulse intact LUE, cap refill good -Normal sensation -Normal ROM, good strength Right Lower Extremity: -No point tenderness, deformity or other signs of injury -DP intact RLE -Normal sensation -Normal ROM, good strength Left Lower Extremity: -No point tenderness, deformity or other signs of injury -DP intact LLE -Normal sensation -Normal ROM, good strength  Back/Spine: -No midline C, T, or L spine tenderness or step-offs -Mild Ttp to R paraspinal musculature of thoracic spine without any deformities, crepitus, or signs of trauma  Other: N/A     ED Results / Procedures / Treatments    Labs (all labs ordered are listed, but only abnormal results are displayed) Labs Reviewed - No data to display  EKG None  Radiology DG Ribs Unilateral W/Chest Right  Result Date: 04/27/2023 CLINICAL DATA:  77 year old female with history of trauma from a fall complaining of right-sided rib pain. EXAM: RIGHT RIBS AND CHEST - 3+ VIEW COMPARISON:  Chest x-ray 02/19/2022. FINDINGS: Lung volumes are low. Mild scarring in the left lung base, similar to the prior study. No consolidative airspace disease. No pleural effusions. No pneumothorax. No pulmonary nodule or mass noted. Pulmonary vasculature and the cardiomediastinal silhouette are within normal limits. Dedicated views of the right ribs demonstrate old healed fractures of the posterolateral aspects of the right fourth rib and lateral right sixth ribs. No acute displaced right-sided rib fractures are noted. IMPRESSION: 1.  No acute displaced right-sided rib fractures. 2. Low lung volumes without radiographic evidence of acute cardiopulmonary disease. 3. Old healed fractures of the right fourth and sixth ribs, similar to the prior chest x-ray 02/19/2022. Electronically Signed   By: Trudie Reed M.D.   On: 04/27/2023 07:11   CT Cervical Spine Wo Contrast  Result Date: 04/27/2023 CLINICAL DATA:  Neck trauma (Age >= 65y).  Fall. EXAM: CT CERVICAL SPINE WITHOUT CONTRAST TECHNIQUE: Multidetector CT imaging of the cervical spine was performed without intravenous contrast. Multiplanar CT image reconstructions were also generated. RADIATION DOSE REDUCTION: This exam was performed according to the departmental dose-optimization program which includes automated exposure control, adjustment of the mA and/or kV according to patient size and/or use of iterative reconstruction technique. COMPARISON:  10/19/2020 FINDINGS: Alignment: Normal Skull base and vertebrae: No acute fracture. No primary bone lesion or focal pathologic process. Soft tissues and spinal  canal: No prevertebral fluid or swelling. No visible canal hematoma. Disc levels: Diffuse degenerative disc disease with disc space narrowing and spurring. Bilateral degenerative facet disease. No visible disc herniation. Multilevel bilateral neural foraminal narrowing due to uncovertebral spurring and facet disease. Upper chest: No acute findings Other: None IMPRESSION: Cervical spondylosis as above. No acute bony abnormality. Electronically Signed   By: Charlett Nose M.D.   On: 04/27/2023 07:08   CT Maxillofacial WO CM  Result Date: 04/27/2023 CLINICAL DATA:  Facial trauma, blunt.  Fall EXAM: CT MAXILLOFACIAL WITHOUT CONTRAST TECHNIQUE: Multidetector CT imaging of the maxillofacial structures was performed. Multiplanar CT image reconstructions were also generated. RADIATION DOSE REDUCTION: This exam was performed according to the departmental dose-optimization program which includes automated exposure control, adjustment of the mA and/or kV according to patient size and/or use of iterative reconstruction technique. COMPARISON:  None Available. FINDINGS: Osseous: No fracture or mandibular dislocation. No destructive process. Orbits: Negative. No traumatic or inflammatory finding. Sinuses: No air-fluid levels. Soft tissues: Gas seen within the soft tissues overlying the right side of the mandible. No soft tissue swelling. Limited intracranial: See head CT report IMPRESSION: No facial or orbital fracture. Small amount of gas within the soft tissues overlying the right side of the mandible may be related to soft tissue laceration. No underlying bony abnormality. Electronically Signed   By: Charlett Nose M.D.   On: 04/27/2023 07:06   CT Head Wo Contrast  Result Date: 04/27/2023 CLINICAL DATA:  Fall.  Head trauma, minor (Age >= 65y) EXAM: CT HEAD WITHOUT CONTRAST TECHNIQUE: Contiguous axial images were obtained from the base of the skull through the vertex without intravenous contrast. RADIATION DOSE REDUCTION:  This exam was performed according to the departmental dose-optimization program which includes automated exposure control, adjustment of the mA and/or kV according to patient size and/or use of iterative reconstruction technique. COMPARISON:  MRI 04/24/2022 FINDINGS: Brain: No acute intracranial abnormality. Specifically, no hemorrhage, hydrocephalus, mass lesion, acute infarction, or significant intracranial injury. Vascular: No hyperdense vessel or unexpected calcification. Skull: No acute calvarial abnormality. Sinuses/Orbits: No acute findings Other: None IMPRESSION: No acute intracranial abnormality. Electronically Signed   By: Charlett Nose M.D.   On: 04/27/2023 07:03    Procedures .Marland KitchenLaceration Repair  Date/Time: 04/27/2023 8:29 AM  Performed by: Loetta Rough, MD Authorized by: Loetta Rough, MD   Consent:    Consent obtained:  Verbal   Consent given by:  Patient   Risks discussed:  Infection, need for additional repair, nerve damage, poor wound healing, poor cosmetic result, pain, retained foreign body, tendon  damage and vascular damage   Alternatives discussed:  No treatment Universal protocol:    Procedure explained and questions answered to patient or proxy's satisfaction: yes     Imaging studies available: yes     Required blood products, implants, devices, and special equipment available: yes     Immediately prior to procedure, a time out was called: yes     Patient identity confirmed:  Verbally with patient Anesthesia:    Anesthesia method:  Local infiltration   Local anesthetic:  Lidocaine 1% WITH epi Laceration details:    Location:  Lip   Lip location:  Lower exterior lip   Length (cm):  1.5   Depth (mm):  5 Pre-procedure details:    Preparation:  Imaging obtained to evaluate for foreign bodies and patient was prepped and draped in usual sterile fashion Exploration:    Hemostasis achieved with:  Direct pressure   Imaging obtained comment:  CT   Imaging outcome:  foreign body not noted     Wound exploration: wound explored through full range of motion     Wound extent: areolar tissue not violated, fascia not violated, no foreign body, no signs of injury, no nerve damage, no tendon damage, no underlying fracture and no vascular damage     Contaminated: no   Treatment:    Area cleansed with:  Saline   Amount of cleaning:  Standard   Irrigation solution:  Sterile saline   Irrigation volume:  400 cc   Irrigation method:  Pressure wash and tap   Visualized foreign bodies/material removed: no   Skin repair:    Repair method:  Sutures   Suture size:  6-0   Suture material:  Prolene   Suture technique:  Simple interrupted   Number of sutures:  3 Approximation:    Approximation:  Close   Vermilion border well-aligned: yes   Repair type:    Repair type:  Simple Post-procedure details:    Dressing:  Open (no dressing)   Procedure completion:  Tolerated well, no immediate complications     Medications Ordered in ED Medications  lidocaine-EPINEPHrine (PF) (XYLOCAINE-EPINEPHrine) 1 %-1:200000 (PF) injection 10 mL (has no administration in time range)  Tdap (BOOSTRIX) injection 0.5 mL (has no administration in time range)    ED Course/ Medical Decision Making/ A&P                          Medical Decision Making Amount and/or Complexity of Data Reviewed Radiology:  Decision-making details documented in ED Course.  Risk Prescription drug management.    This patient presents to the ED for concern of fall out of bed when she reached for something; this involves an extensive number of treatment options, and is a complaint that carries with it a high risk of complications and morbidity.  I considered the following differential and admission for this acute, potentially life threatening condition.  However patient is very stable and well-appearing at this time.  MDM:    DDX for trauma includes but is not limited to:  -Head Injury such as skull  fx or ICH -did fall and hit her head, no LOC, given age will obtain CT head, which is without any ICH. -Patient does endorse pain to the right side of her chest and right side of her back, she has a history of rib fractures on the side, will obtain a right rib x-ray.  She has no increased work of breathing or shortness  of breath, vitally stable on room air, low concern for PTX or hemothorax. I discussed with patient that XR is not the best modality for rib fracture and that if she continues to have severe pain she may need CT imaging. -Spinal Cord or Vertebral injury - has lateral R sided neck tenderness, no midline c-spine TTP, and C-spine CT without injury -Does have right shoulder pain that is not any worse than what her normal pain is.  She is in normal right shoulder exam without any deformity or signs of trauma.  Do not believe repeat x-rays are warranted here today.  Laceration was cleaned, patient was consented, and lac repaired with three 6-0 prolene sutures without complication. Per chart review, tetanus was last updated in 2015, will update her today.  COUNSELING: Patient is advised on follow up timing for suture removal, to be removed by healthcare professional in 5-7 days  I also discussed the inevitability of scarring with the patient. They understand that all lacerations will leave a varying degree of scarring and optimal outcome/cosmetic appearance can never be guaranteed.   Clinical Course as of 04/27/23 0830  Sun Apr 27, 2023  0725 DG Ribs Unilateral W/Chest Right 1. No acute displaced right-sided rib fractures. 2. Low lung volumes without radiographic evidence of acute cardiopulmonary disease. 3. Old healed fractures of the right fourth and sixth ribs, similar to the prior chest x-ray 02/19/2022.   [HN]  0725 CT Cervical Spine Wo Contrast Cervical spondylosis as above.  No acute bony abnormality.   [HN]  0725 CT Maxillofacial WO CM No facial or orbital  fracture.  Small amount of gas within the soft tissues overlying the right side of the mandible may be related to soft tissue laceration. No underlying bony abnormality.   [HN]  0725 CT Head Wo Contrast No acute intracranial abnormality. [HN]    Clinical Course User Index [HN] Loetta Rough, MD    Imaging Studies ordered: I ordered imaging studies including those listed above I independently visualized and interpreted imaging. I agree with the radiologist interpretation  Additional history obtained from chart review, husband at bedside.    Reevaluation: After the interventions noted above, I reevaluated the patient and found that they have :improved  Social Determinants of Health: Patient lives independently   Disposition:  DC w/ discharge instructions/return precautions. All questions answered to patient's satisfaction.    Co morbidities that complicate the patient evaluation  Past Medical History:  Diagnosis Date   Cataract    Osteoporosis    Sleep apnea    Thyroid disease      Medicines Meds ordered this encounter  Medications   lidocaine-EPINEPHrine (PF) (XYLOCAINE-EPINEPHrine) 1 %-1:200000 (PF) injection 10 mL   Tdap (BOOSTRIX) injection 0.5 mL    I have reviewed the patients home medicines and have made adjustments as needed  Problem List / ED Course: Problem List Items Addressed This Visit   None Visit Diagnoses     Fall, initial encounter    -  Primary   Facial laceration, initial encounter                       This note was created using dictation software, which may contain spelling or grammatical errors.    Loetta Rough, MD 04/27/23 [phone]

## 2023-04-27 NOTE — ED Triage Notes (Signed)
Patient from home for a fall. Patient reports she was in bed and reaching for her phone when she slid off of the bed; reports she hit the right side of her face on the chest of drawers. Denies LOC. Upon arrival to ER, patient is alert and oriented, ambu with walker. Patient has laceration noted to the lower R lip, bleeding controlled; bruising noted to the R cheek. Patient reports pain in her R ribs, neck, and back. Reports she had a chest x-ray recently and was told she has a cracked rib on the R side

## 2023-04-27 NOTE — Discharge Instructions (Addendum)
Thank you for coming to [facility]. You were seen for fall out of bed. We did an exam, and imaging, and these showed a small laceration of your right lower lip which was repaired with 3 sutures.  Keep this area clean and dry.  You can wash her face as normal but do not scrub the sutures.  You can use Neosporin or another antibiotic ointment twice per day.  Please watch for signs of infection which include increased redness, swelling, pain, pus discharge, or fevers or chills. Please follow up with your primary care provider within 5-7 days to have your sutures removed. You can alternate taking Tylenol and ibuprofen as needed for pain. You can take 650mg  tylenol (acetaminophen) every 4-6 hours, and 600 mg ibuprofen 3 times a day.   Do not hesitate to return to the ED or call 911 if you experience: -Worsening symptoms -Headache, visual changes, confusion -Chest pain, shortness of breath -Lightheadedness, passing out -Fevers/chills -Anything else that concerns you

## 2023-04-29 ENCOUNTER — Ambulatory Visit (HOSPITAL_COMMUNITY): Payer: Medicare PPO | Admitting: Occupational Therapy

## 2023-04-29 ENCOUNTER — Telehealth: Payer: Self-pay

## 2023-04-29 NOTE — Transitions of Care (Post Inpatient/ED Visit) (Signed)
04/29/2023  Name: Amanda Mathews MRN: [id number] DOB: Amanda Mathews  Today's TOC FU Call Status: Today's TOC FU Call Status:: Successful TOC FU Call Competed TOC FU Call Complete Date: 04/29/23  Red on EMMI-ED Discharge Alert Date & Reason:04/28/23 "Scheduled follow-up appt? No"  Transition Care Management Follow-up Telephone Call Date of Discharge: 04/27/23 [facility] Type of Discharge: Emergency Department Reason for ED Visit: Other: ("fall,facial laceration") How have you been since you were released from the hospital?: Better Any questions or concerns?: No  Items Reviewed: Did you receive and understand the discharge instructions provided?: Yes Medications obtained,verified, and reconciled?: Yes (Medications Reviewed) Any new allergies since your discharge?: No Dietary orders reviewed?: Yes Type of Diet Ordered:: low salt/heart healthy Do you have support at home?: Yes People in Home: alone, sibling(s) Name of Support/Comfort Primary Source: twin sister-Amanda Mathews lives next door  Medications Reviewed Today: Medications Reviewed Today     Reviewed by Charlyn Minerva, RN (Registered Nurse) on 04/29/23 at 1444  Med List Status: <None>   Medication Order Taking? Sig Documenting Provider Last Dose Status Informant  acetaminophen (TYLENOL) 500 MG tablet [id number] Yes Take 500 mg by mouth every 6 (six) hours as needed for mild pain. [provider] Taking Active Self  ammonium lactate (AMLACTIN) 12 % cream [id number] Yes APPLY THIN COAT TO AFFECTED AREAS & RUB IN TWICE A DAY IN THE MORNING & IN THE EVENING [provider] Taking Active   Calcium Carb-Cholecalciferol (CALCIUM 600-D PO) [id number] Yes Take 600 mg by mouth daily. [provider] Taking Active Self  carbamazepine (TEGRETOL) 200 MG tablet [id number] Yes Take 200 mg by mouth 2 (two) times daily. 5 times daily 200 mg [provider] Taking Active Pharmacy  Records  Cholecalciferol (VITAMIN D3) 125 MCG (5000 UT) CAPS [id number] Yes Take 5,000 Units by mouth daily. [provider] Taking Active Self  denosumab (PROLIA) 60 MG/ML SOSY injection [id number]  Inject 60 mg into the skin every 6 (six) months. [provider]  Active Self  diphenhydramine-acetaminophen (TYLENOL PM) 25-500 MG TABS tablet [id number] Yes Take 1 tablet by mouth at bedtime as needed. [provider] Taking Active Self  gabapentin (NEURONTIN) 300 MG capsule [id number] Yes TAKE 1 CAPSULE BY MOUTH THREE TIMES A DAY  Patient taking differently: Twice a day   Ardith Dark, MD Taking Active   hydrochlorothiazide (HYDRODIURIL) 25 MG tablet [id number] Yes TAKE 1 TABLET (25 MG TOTAL) BY MOUTH DAILY. Ardith Dark, MD Taking Active   hydrocortisone 2.5 % cream [id number] Yes Apply topically daily as needed. [provider] Taking Active   levocetirizine (XYZAL) 5 MG tablet [id number] Yes TAKE 1 TABLET BY MOUTH EVERY DAY IN THE Greig Right, Stephanie, NP Taking Active   levothyroxine (SYNTHROID) 150 MCG tablet [id number] Yes TAKE 1 TABLET BY MOUTH EVERY DAY BEFORE BREAKFAST Ardith Dark, MD Taking Active   MAGNESIUM PO [id number] Yes Take 1 tablet by mouth 2 (two) times daily. 250 mg [provider] Taking Active Self  meloxicam (MOBIC) 7.5 MG tablet [id number] Yes Take 1 tablet (7.5 mg total) by mouth daily. Please schedule an appt with Dr. [name] for further refills [phone] Ardith Dark, MD Taking Active   methocarbamol (ROBAXIN) 750 MG tablet [id number] Yes TAKE 1 TABLET BY MOUTH TWICE A DAY AS NEEDED FOR MUSCLE SPASMS Ardith Dark, MD Taking Active   nystatin cream (MYCOSTATIN) [id number] Yes nystatin 100,000 unit/gram topical cream [provider] Taking Active  potassium chloride SA (KLOR-CON) 20 MEQ tablet [id number] Yes Take 20 mEq by mouth daily. [provider] Taking Active Self  simvastatin (ZOCOR) 40 MG  tablet [id number] Yes Take 1 tablet (40 mg total) by mouth at bedtime. Ardith Dark, MD Taking Active   triamcinolone cream (KENALOG) 0.1 % [id number] Yes MIX WITH NYSTATIN AND APPLY A THIN LAYER TO THE AFFECTED AREA TWICE DAILY AS NEEDED [provider] Taking Active             Home Care and Equipment/Supplies: Were Home Health Services Ordered?: NA Any new equipment or medical supplies ordered?: NA  Functional Questionnaire: Do you need assistance with bathing/showering or dressing?: No Do you need assistance with meal preparation?: No Do you need assistance with eating?: No Do you have difficulty maintaining continence: No Do you need assistance with getting out of bed/getting out of a chair/moving?: No Do you have difficulty managing or taking your medications?: No  Follow up appointments reviewed: PCP Follow-up appointment confirmed?: Yes Date of PCP follow-up appointment?: 05/02/23 Follow-up Provider: Dr. [name] Specialist [facility] Follow-up appointment confirmed?: NA Do you need transportation to your follow-up appointment?: No (pt states her sister will take her to appts) Do you understand care options if your condition(s) worsen?: Yes-patient verbalized understanding   TOC Interventions Today    Flowsheet Row Most Recent Value  TOC Interventions   TOC Interventions Discussed/Reviewed TOC Interventions Discussed, Post discharge activity limitations per provider, S/S of infection  [pt states laceration is healing and looking good-goes to have sutures removed on Friday at PCP appt]      Interventions Today    Flowsheet Row Most Recent Value  General Interventions   General Interventions Discussed/Reviewed General Interventions Discussed, Doctor Visits  Doctor Visits Discussed/Reviewed Doctor Visits Discussed, PCP  PCP/Specialist Visits Compliance with follow-up visit  Education Interventions   Education Provided Provided Education  Provided Verbal  Education On Nutrition, When to see the doctor, Medication, Other  [pain mgmt]  Nutrition Interventions   Nutrition Discussed/Reviewed Nutrition Discussed, Adding fruits and vegetables, Decreasing salt  Pharmacy Interventions   Pharmacy Dicussed/Reviewed Pharmacy Topics Discussed, Medications and their functions  Safety Interventions   Safety Discussed/Reviewed Fall Risk, Home Safety, Safety Discussed  [pt is being followed by outpt therapy for PT services]  Home Safety Assistive Devices  [pt reports she is being careful when ambulating-using walker]         Antionette Fairy, RN,BSN,CCM [facility] Care Management Care Management Community Coordinator Direct Phone: [phone] Toll Free: [phone] Fax: [phone]

## 2023-05-02 ENCOUNTER — Encounter: Payer: Self-pay | Admitting: Family Medicine

## 2023-05-02 ENCOUNTER — Ambulatory Visit: Payer: Medicare PPO | Admitting: Family Medicine

## 2023-05-02 VITALS — BP 136/81 | HR 78 | Temp 97.8°F | Ht 60.0 in | Wt 184.6 lb

## 2023-05-02 DIAGNOSIS — M810 Age-related osteoporosis without current pathological fracture: Secondary | ICD-10-CM

## 2023-05-02 DIAGNOSIS — I10 Essential (primary) hypertension: Secondary | ICD-10-CM

## 2023-05-02 DIAGNOSIS — S0181XD Laceration without foreign body of other part of head, subsequent encounter: Secondary | ICD-10-CM

## 2023-05-02 DIAGNOSIS — M26629 Arthralgia of temporomandibular joint, unspecified side: Secondary | ICD-10-CM | POA: Diagnosis not present

## 2023-05-02 DIAGNOSIS — M25511 Pain in right shoulder: Secondary | ICD-10-CM | POA: Diagnosis not present

## 2023-05-02 NOTE — Assessment & Plan Note (Signed)
Due for Prolia next week.  Thankfully no fractures with recent falls.  She will continue calcium and vitamin D supplementation as well.

## 2023-05-02 NOTE — Patient Instructions (Signed)
It was very nice to see you today!  We removed your stitches today  Work on exercises to help with your jaw.  Return if symptoms worsen or fail to improve.   Take care, Dr [name]  PLEASE NOTE:  If you had any lab tests, please let us know if you have not heard back within a few days. You may see your results on mychart before we have a chance to review them but we will give you a call once they are reviewed by [place].   If we ordered any referrals today, please let us know if you have not heard from their office within the next week.   If you had any urgent prescriptions sent in today, please check with the pharmacy within an hour of our visit to make sure the prescription was transmitted appropriately.   Please try these tips to maintain a healthy lifestyle:  Eat at least 3 REAL meals and 1-2 snacks per day.  Aim for no more than 5 hours between eating.  If you eat breakfast, please do so within one hour of getting up.   Each meal should contain half fruits/vegetables, one quarter protein, and one quarter carbs (no bigger than a computer mouse)  Cut down on sweet beverages. This includes juice, soda, and sweet tea.   Drink at least 1 glass of water with each meal and aim for at least 8 glasses per day  Exercise at least 150 minutes every week.    Jaw Range of Motion Exercises Jaw range of motion exercises are exercises that help your jaw move better. Exercises that help you have good posture (postural exercises) also help relieve jaw discomfort. These are often done along with range of motion exercises. These exercises can help prevent or improve: Trouble opening your mouth. Pain in your jaw while it is open or closed. Temporomandibular joint (TMJ) pain. Headache caused by jaw tension. Take other actions to prevent or relieve jaw pain, such as: Avoiding things that cause or increase jaw pain. This may include: Chewing gum or eating hard foods. Clenching your jaw or teeth,  grinding your teeth, or keeping tension in your jaw muscles. Opening your mouth wide, such as for a big yawn. Leaning on your jaw, such as resting your jaw in your hand while leaning on a desk. Putting ice on your jaw. To do this: Put ice in a plastic bag. Place a towel between your skin and the bag. Leave the ice on for 20 minutes, 2-3 times a day. Remove the ice if your skin turns bright red. This is very important. If you cannot feel pain, heat, or cold, you have a greater risk of damage to the area. Only do jaw exercises that your health care provider recommends. Only move your jaw as far as it can comfortably go in each direction. Do not move your jaw into positions that cause pain. Range of motion exercises Repeat each of these exercises 8 times, 1-2 times a day, or as told by your health care provider. Exercise A: Forward protrusion  Push your jaw forward. Hold this position for 1-2 seconds. Let your jaw return to its normal position and rest it there for 1-2 seconds. Exercise B: Controlled opening  Stand or sit in front of a mirror. Place your tongue on the roof of your mouth, just behind your top teeth. Keeping your tongue on the roof of your mouth, slowly open and close your mouth. While you open and close your  mouth, watch your jaw in the mirror. Try to keep your jaw from moving to one side or the other. Exercise C: Right and left motion  Move your jaw right. Hold this position for 1-2 seconds. Let your jaw return to its normal position, and rest it there for 1-2 seconds. Move your jaw left. Hold this position for 1-2 seconds. Let your jaw return to its normal position, and rest it there for 1-2 seconds. Postural exercises Exercise A: Chin tucks  You can do this exercise sitting, standing, or lying down. Move your head straight back, keeping your head level. You can guide the movement by placing your fingers on your chin to push your jaw back in an even motion. You should be  able to feel a double chin form at the end of the motion. Hold this position for 5 seconds. Repeat 10-15 times. Exercise B: Shoulder blade squeeze  Sit or stand. Bend your elbows to about 90 degrees, which is the shape of a capital letter "L." Keep your upper arms by your body. Squeeze your shoulder blades down and back, as though you were trying to touch your elbows behind you. Do not shrug your shoulders or move your head. Hold this position for 5 seconds. Repeat 10-15 times. Exercise C: Chest stretch  Stand in a doorway with one of your feet slightly in front of the other. This is called a staggered stance. If you cannot reach your forearms to the door frame, do this exercise in a corner of a room. Put both of your hands and your forearms on the door frame, with your arms wide apart. Make sure your arms are at a 90-degree angle to your body. Place your hands on the door frame at the height of your elbows. Slowly move your weight onto your front foot until you feel a stretch across your chest and in the front of your shoulders. Keep your head and chest upright and keep your abdominal muscles tight. Do not lean in. Hold this position for 30 seconds. Repeat 3 times. Contact a health care provider if: You have jaw pain that is new or gets worse. You have clicking or popping sounds while doing the exercises. Get help right away if: Your jaw is stuck in one place and you cannot move it. You cannot open or close your mouth. Summary Jaw range of motion exercises are exercises that help your jaw move better. Take actions to prevent or relieve jaw pain: limit chewing gum or eating hard foods; clenching your jaw or teeth; or leaning on your jaw, such as resting your jaw in your hand while leaning on a desk. Repeat each of the jaw range of motion exercises 8 times, 1-2 times a day, or as told by your health care provider. Contact a health care provider if you have clicking or popping sounds while  doing the exercises. This information is not intended to replace advice given to you by your health care provider. Make sure you discuss any questions you have with your health care provider. Document Revised: 07/15/2021 Document Reviewed: 07/15/2021 Elsevier Patient Education  2023 [employer].

## 2023-05-02 NOTE — Progress Notes (Signed)
Amanda Mathews is a 77 y.o. female who presents today for an office visit.  Assessment/Plan:  New/Acute Problems: Fall Mechanical in nature after falling out of bed.  She has fallen a couple of times within the last year or so.  She does have referral to physical therapy pending.  Hopefully they will also be able to help work on gait training.  Laceration Sutures removed today without difficulty.  Laceration is well-healed.  We discussed care after and ways to minimize scarring  Right Shoulder Pain No significant worsening pain since her fall.  She we will schedule physical therapy soon.  She will let us know if not improving and we can refer to sports medicine.  Chronic Problems Addressed Today: Essential hypertension Initially elevated however at goal on recheck with HCTZ 12.5 mg once daily.  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  No signs of otitis media today.  We discussed home exercises and handout was given.  Would consider referral to maxillofacial specialist if this continues to be an issue.  Osteoporosis Due for Prolia next week.  Thankfully no fractures with recent falls.  She will continue calcium and vitamin D supplementation as well.     Subjective:  HPI:  See A/P for status of chronic conditions.  Patient is here for ED follow-up.  She went to the ED 5 days ago after falling off of bed.  Suffered a laceration to her left lower lip.  This was repaired in the ED with 3 simple interrupted sutures.  Had a head CT scan which was negative.  They also obtained right-sided rib x-ray which did not show any significant abnormalities.  CT neck did not show injuries.  She was discharged home.  Pain has been improving the last several days.  She is still has ongoing right shoulder pain.  We did see her for this a couple weeks ago and referred her to see physical therapy.  This had to be postponed due to her recent fall.  Pain is about the same.  She is also  been having some issues with right-sided ear pain.  This is going on for quite a while.  She would like to make sure she does not have an ear infection.  No hearing loss.  Worse with certain motions.       Objective:  Physical Exam: BP 136/81   Pulse 78   Temp 97.8 F (36.6 C) (Temporal)   Ht 5' (1.524 m)   Wt 184 lb 9.6 oz (83.7 kg)   SpO2 97%   BMI 36.05 kg/m   Gen: No acute distress, resting comfortably HEENT: Bilateral TMs clear.  Right TMJ tender to palpation.  Palpable click noted with lateral movement.  Ecchymosis noted on right lower lip and right mandible CV: Regular rate and rhythm with no murmurs appreciated Skin: Scattered ecchymosis noted on extremities and trunk Pulm: Normal work of breathing, clear to auscultation bilaterally with no crackles, wheezes, or rhonchi Neuro: Grossly normal, moves all extremities Psych: Normal affect and thought content  Procedure note Verbal consent obtained.  Her 3 simple interrupted sutures were removed without complication.  No blood loss.  No pain.  Time Spent: 40 minutes of total time was spent on the date of the encounter performing the following actions: chart review prior to seeing the patient including recent Emergency Department visit, obtaining history, performing a medically necessary exam, counseling on the treatment plan, removing sutures, placing orders, and documenting in our  EHR.        Katina Degree. [name], MD 05/02/2023 10:40 AM

## 2023-05-02 NOTE — Assessment & Plan Note (Signed)
Likely the source of her right ear pain.  No signs of otitis media today.  We discussed home exercises and handout was given.  Would consider referral to maxillofacial specialist if this continues to be an issue.

## 2023-05-02 NOTE — Assessment & Plan Note (Signed)
Initially elevated however at goal on recheck with HCTZ 12.5 mg once daily.

## 2023-05-03 ENCOUNTER — Other Ambulatory Visit: Payer: Self-pay | Admitting: Family Medicine

## 2023-05-07 NOTE — Telephone Encounter (Signed)
Patient called stating she was informed to come in for her prolia injection on 5/28. I informed pt that she is not on our schedule. Pt states she would like to complete prolia injection which is due on 5/29 or after. Please Advise.

## 2023-05-14 ENCOUNTER — Encounter (HOSPITAL_COMMUNITY): Payer: Medicare PPO | Admitting: Occupational Therapy

## 2023-05-15 ENCOUNTER — Ambulatory Visit: Payer: Medicare PPO

## 2023-05-15 DIAGNOSIS — M810 Age-related osteoporosis without current pathological fracture: Secondary | ICD-10-CM | POA: Diagnosis not present

## 2023-05-15 MED ORDER — DENOSUMAB 60 MG/ML ~~LOC~~ SOSY
60.0000 mg | PREFILLED_SYRINGE | Freq: Once | SUBCUTANEOUS | Status: AC
Start: 2023-05-15 — End: 2023-05-15
  Administered 2023-05-15: 60 mg via SUBCUTANEOUS

## 2023-05-15 NOTE — Progress Notes (Addendum)
Pt is here for Prolia injection for Dr. [name], given in left arm. Pt tolerated well.   Next injection : Nov 14, 2023

## 2023-05-20 ENCOUNTER — Encounter (HOSPITAL_COMMUNITY): Payer: Self-pay | Admitting: Occupational Therapy

## 2023-05-20 ENCOUNTER — Ambulatory Visit (HOSPITAL_COMMUNITY): Payer: Medicare PPO | Attending: Family Medicine | Admitting: Occupational Therapy

## 2023-05-20 ENCOUNTER — Other Ambulatory Visit: Payer: Self-pay | Admitting: *Deleted

## 2023-05-20 DIAGNOSIS — R29898 Other symptoms and signs involving the musculoskeletal system: Secondary | ICD-10-CM | POA: Diagnosis not present

## 2023-05-20 DIAGNOSIS — M79601 Pain in right arm: Secondary | ICD-10-CM | POA: Diagnosis not present

## 2023-05-20 DIAGNOSIS — M25611 Stiffness of right shoulder, not elsewhere classified: Secondary | ICD-10-CM | POA: Insufficient documentation

## 2023-05-20 DIAGNOSIS — M79621 Pain in right upper arm: Secondary | ICD-10-CM | POA: Diagnosis not present

## 2023-05-20 NOTE — Therapy (Signed)
OUTPATIENT OCCUPATIONAL THERAPY ORTHO EVALUATION  Patient Name: Amanda Mathews MRN: [id number] DOB:1946/06/15, 77 y.o., female Today's Date: 05/20/2023  PCP: Ardith Dark, MD REFERRING PROVIDER: Ardith Dark, MD  END OF SESSION:  OT End of Session - 05/20/23 1108     Visit Number 1    Number of Visits 9    Date for OT Re-Evaluation 06/20/23    Authorization Type [employer]    Authorization Time Period Requesting 8 visits    OT Start Time 0950    OT Stop Time 1040    OT Time Calculation (min) 50 min    Activity Tolerance Patient tolerated treatment well    Behavior During Therapy [facility] for tasks assessed/performed             Past Medical History:  Diagnosis Date   Cataract    Osteoporosis    Sleep apnea    Thyroid disease    Past Surgical History:  Procedure Laterality Date   back stimulator      BUNIONECTOMY     CHOLECYSTECTOMY     FEMUR FRACTURE SURGERY     REPLACEMENT TOTAL KNEE BILATERAL Bilateral    Patient Active Problem List   Diagnosis Date Noted   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   Spinal stenosis of lumbar region 04/27/2023   Trigeminal neuralgia 04/27/2023   Leg edema 04/17/2023   Dystonia 04/29/2022   Hypokalemia 01/23/2021   Chronic low back pain 07/17/2020   Essential hypertension 07/17/2020   Hypothyroidism 07/17/2020   Dyslipidemia 07/17/2020   Osteoporosis 07/17/2020   Depression, major, single episode, 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   Osteoarthritis 07/17/2020   OSA on CPAP 07/17/2020    ONSET DATE: 05/01/23  REFERRING DIAG: RUE Pain  THERAPY DIAG:  Pain in right upper arm - Plan: Ot plan of care cert/re-cert  Shoulder stiffness, right - Plan: Ot plan of care cert/re-cert  Other symptoms and signs involving the musculoskeletal system - Plan: Ot plan of care cert/re-cert  Rationale for Evaluation and Treatment: Rehabilitation  SUBJECTIVE:   SUBJECTIVE STATEMENT: "It's cause I fell." Pt accompanied by:  self  PERTINENT HISTORY: Pt has had multiple falls in the last 2 months, the last one resulting in a face laceration and RUE pain. Additionally her PMH significant for Osteoporosis, Spinal Stimulator, and HTN.  PRECAUTIONS: None  WEIGHT BEARING RESTRICTIONS: No  PAIN:  Are you having pain? Yes: NPRS scale: 3/10 Pain location: Bicep Region Pain description: aching Aggravating factors: movement Relieving factors: Tylenol  FALLS: Has patient fallen in last 6 months? Yes. Number of falls 2  LIVING ENVIRONMENT: Lives with: lives alone Lives in: House/apartment  PLOF: Independent with household mobility with device  PATIENT GOALS: To reduce pain  NEXT MD VISIT: 05/22/23  OBJECTIVE:   HAND DOMINANCE: Right  ADLs: Overall ADLs: Pt has difficulties with donning her bra and reaching back to wash/dry her back. Unable to lift items for cooking and cleaning.   FUNCTIONAL OUTCOME MEASURES: FOTO: 54.38  UPPER EXTREMITY ROM:     Active ROM Right eval  Shoulder flexion 144  Shoulder abduction 138  Shoulder internal rotation 90  Shoulder external rotation 50  (Blank rows = not tested)   UPPER EXTREMITY MMT:     MMT Right eval  Shoulder flexion 4/5  Shoulder abduction 3+/5  Shoulder adduction 4/5  Shoulder extension 4/5  Shoulder internal rotation 3+/5  Shoulder external rotation 3+/5  Elbow flexion 4+/5  Elbow extension 5/5  (Blank rows = not tested)  SENSATION: Intermittent numbness or tingling in R hand  EDEMA: No swelling noted  OBSERVATIONS: Moderate fascial restrictions along the biceps, flexors and extensors of forearm, and upper trapezius.    TODAY'S TREATMENT:                                                                                                                              DATE: 05/20/23: Evaluation Only    PATIENT EDUCATION: Education details: Will start first treatment session Person educated: Patient Education method: Explanation Education  comprehension: verbalized understanding  HOME EXERCISE PROGRAM: Will start first treatment session  GOALS: Goals reviewed with patient? Yes  SHORT TERM GOALS: Target date: 06/20/23  Pt will be provided and educated on HEP for RUE mobility during ADL completion.  Goal status: INITIAL  2.  Pt will decrease pain to 2/10 in RUE in order to sleep for 2+ consecutive hours without waking due to pain.   Goal status: INITIAL  3.  Pt will decrease fascial restrictions in RUE to minimal amounts or less in order to complete reaching tasks.  Goal status: INITIAL  4.  Pt will increase RUE ROM to [facility] in order to complete overhead and behind the back reaching during dressing and bathing.   Goal status: INITIAL  5.  Pt will improve RUE to 4+/5 in order to complete lifting tasks during cooking and cleaning.   Goal status: INITIAL   ASSESSMENT:  CLINICAL IMPRESSION: Patient is a 77 y.o. female who was seen today for occupational therapy evaluation for RUE pain, limiting her ability to complete ADL's and IADL's.   PERFORMANCE DEFICITS: in functional skills including ADLs, IADLs, ROM, strength, pain, fascial restrictions, Gross motor control, body mechanics, and UE functional use.  IMPAIRMENTS: are limiting patient from ADLs, IADLs, rest and sleep, leisure, and social participation.   COMORBIDITIES: has no other co-morbidities that affects occupational performance. Patient will benefit from skilled OT to address above impairments and improve overall function.  MODIFICATION OR ASSISTANCE TO COMPLETE EVALUATION: No modification of tasks or assist necessary to complete an evaluation.  OT OCCUPATIONAL PROFILE AND HISTORY: Problem focused assessment: Including review of records relating to presenting problem.  CLINICAL DECISION MAKING: LOW - limited treatment options, no task modification necessary  REHAB POTENTIAL: Good  EVALUATION COMPLEXITY: Low      PLAN:  OT FREQUENCY:  2x/week  OT DURATION: 4 weeks  PLANNED INTERVENTIONS: self care/ADL training, therapeutic exercise, therapeutic activity, manual therapy, passive range of motion, functional mobility training, electrical stimulation, ultrasound, moist heat, patient/family education, energy conservation, coping strategies training, and DME and/or AE instructions  RECOMMENDED OTHER SERVICES: N/A  CONSULTED AND AGREED WITH PLAN OF CARE: Patient  PLAN FOR NEXT SESSION: Manual Therapy, AA/ROM, Wall Slides, Bicep Tendonitis stretches   Trish Mage, OTR/L [facility] [phone] Kennyth Arnold, OT 05/20/2023, 11:09 AM

## 2023-05-22 ENCOUNTER — Encounter: Payer: Self-pay | Admitting: Family Medicine

## 2023-05-22 ENCOUNTER — Ambulatory Visit (INDEPENDENT_AMBULATORY_CARE_PROVIDER_SITE_OTHER): Payer: Medicare PPO | Admitting: Family Medicine

## 2023-05-22 VITALS — BP 129/77 | HR 72 | Temp 97.7°F | Ht 60.0 in | Wt 182.8 lb

## 2023-05-22 DIAGNOSIS — R350 Frequency of micturition: Secondary | ICD-10-CM | POA: Diagnosis not present

## 2023-05-22 DIAGNOSIS — I10 Essential (primary) hypertension: Secondary | ICD-10-CM

## 2023-05-22 DIAGNOSIS — E039 Hypothyroidism, unspecified: Secondary | ICD-10-CM

## 2023-05-22 DIAGNOSIS — G8929 Other chronic pain: Secondary | ICD-10-CM | POA: Diagnosis not present

## 2023-05-22 DIAGNOSIS — M545 Low back pain, unspecified: Secondary | ICD-10-CM | POA: Diagnosis not present

## 2023-05-22 LAB — URINALYSIS, ROUTINE W REFLEX MICROSCOPIC
Bilirubin Urine: NEGATIVE
Ketones, ur: NEGATIVE
Nitrite: NEGATIVE
Specific Gravity, Urine: 1.015 (ref 1.000–1.030)
Total Protein, Urine: NEGATIVE
Urine Glucose: NEGATIVE
Urobilinogen, UA: 0.2 (ref 0.0–1.0)
pH: 7.5 (ref 5.0–8.0)

## 2023-05-22 MED ORDER — CIPROFLOXACIN HCL 250 MG PO TABS: 250.0000 mg | ORAL_TABLET | Freq: Two times a day (BID) | ORAL | 0 refills | Status: DC

## 2023-05-22 NOTE — Assessment & Plan Note (Addendum)
Initially elevated. On recheck at goal.  Continue HCTZ 12.5 mg once daily.  She will monitor at home and let us know if persistently elevated.

## 2023-05-22 NOTE — Progress Notes (Signed)
   Amanda Mathews is a 77 y.o. female who presents today for an office visit.  Assessment/Plan:  New/Acute Problems: Urinary frequency History consistent with urinary tract infection.  Would be reasonable to empirically start antibiotics while we await culture results.  Will also check send out urinalysis today as well.  She prefers Cipro as this has worked well for her in the past.  We did discuss potential side effects including tendon rupture.  Will send in Cipro 250 mg twice daily for 3 days.  Encouraged hydration.  Will adjust therapy based on culture results if needed.  We discussed reasons return to care.  Chronic Problems Addressed Today: Essential hypertension Initially elevated. On recheck at goal.  Continue HCTZ 12.5 mg once daily.  She will monitor at home and let us know if persistently elevated.   Hypothyroidism Last TSH at goal on synthroid daily.      Subjective:  HPI:  See Assessment / plan for status of chronic conditions.  Her main concern today is urinary frequency. Started about a week ago. Also some more back pain. No dysuria. No hematuria. Some subjective fevers. No chills. Mild nausea. No vomiting.  Also some flank pain.  No rashes.  Patient states that her current symptoms feel similar to previous UTIs.  She just already seen physical therapy for right arm pain.  She has been working on home exercises.  Symptoms have not changed significantly.        Objective:  Physical Exam: BP 129/77   Pulse 72   Temp 97.7 F (36.5 C) (Temporal)   Ht 5' (1.524 m)   Wt 182 lb 12.8 oz (82.9 kg)   SpO2 97%   BMI 35.70 kg/m   Gen: No acute distress, resting comfortably CV: Regular rate and rhythm with no murmurs appreciated Pulm: Normal work of breathing, clear to auscultation bilaterally with no crackles, wheezes, or rhonchi Neuro: Grossly normal, moves all extremities Psych: Normal affect and thought content      Khyleigh Furney M. [name], MD 05/22/2023 9:08  AM

## 2023-05-22 NOTE — Patient Instructions (Signed)
It was very nice to see you today!  You may have a UTI.  We will check a urine test to confirm this.  Please start the Cipro.  Return if symptoms worsen or fail to improve.   Take care, Dr [name]  PLEASE NOTE:  If you had any lab tests, please let us know if you have not heard back within a few days. You may see your results on mychart before we have a chance to review them but we will give you a call once they are reviewed by [place].   If we ordered any referrals today, please let us know if you have not heard from their office within the next week.   If you had any urgent prescriptions sent in today, please check with the pharmacy within an hour of our visit to make sure the prescription was transmitted appropriately.   Please try these tips to maintain a healthy lifestyle:  Eat at least 3 REAL meals and 1-2 snacks per day.  Aim for no more than 5 hours between eating.  If you eat breakfast, please do so within one hour of getting up.   Each meal should contain half fruits/vegetables, one quarter protein, and one quarter carbs (no bigger than a computer mouse)  Cut down on sweet beverages. This includes juice, soda, and sweet tea.   Drink at least 1 glass of water with each meal and aim for at least 8 glasses per day  Exercise at least 150 minutes every week.

## 2023-05-22 NOTE — Assessment & Plan Note (Signed)
Last TSH at goal on synthroid daily.

## 2023-05-23 ENCOUNTER — Telehealth: Payer: Self-pay | Admitting: Family Medicine

## 2023-05-23 NOTE — Telephone Encounter (Signed)
Patient requests to be called to be given lab/urinalysis results

## 2023-05-26 ENCOUNTER — Other Ambulatory Visit: Payer: Self-pay | Admitting: Family Medicine

## 2023-05-26 ENCOUNTER — Encounter: Payer: Self-pay | Admitting: Family Medicine

## 2023-05-26 ENCOUNTER — Telehealth: Payer: Self-pay | Admitting: Family Medicine

## 2023-05-26 NOTE — Telephone Encounter (Signed)
Pt states antibiotics have not completely helped her infection. She would like another RX for another round of antibiotics. Please advise.

## 2023-05-26 NOTE — Telephone Encounter (Signed)
Please advise 

## 2023-05-27 ENCOUNTER — Other Ambulatory Visit: Payer: Self-pay

## 2023-05-27 ENCOUNTER — Other Ambulatory Visit: Payer: Self-pay | Admitting: *Deleted

## 2023-05-27 ENCOUNTER — Emergency Department (HOSPITAL_COMMUNITY): Payer: Medicare PPO

## 2023-05-27 ENCOUNTER — Emergency Department (HOSPITAL_COMMUNITY)
Admission: EM | Admit: 2023-05-27 | Discharge: 2023-05-27 | Disposition: A | Discharge status: Home / Self Care | Payer: Medicare PPO | Attending: Emergency Medicine | Admitting: Emergency Medicine

## 2023-05-27 DIAGNOSIS — M25562 Pain in left knee: Secondary | ICD-10-CM | POA: Diagnosis not present

## 2023-05-27 DIAGNOSIS — X501XXA Overexertion from prolonged static or awkward postures, initial encounter: Secondary | ICD-10-CM | POA: Insufficient documentation

## 2023-05-27 DIAGNOSIS — E039 Hypothyroidism, unspecified: Secondary | ICD-10-CM | POA: Insufficient documentation

## 2023-05-27 DIAGNOSIS — Z79899 Other long term (current) drug therapy: Secondary | ICD-10-CM | POA: Diagnosis not present

## 2023-05-27 DIAGNOSIS — Z96652 Presence of left artificial knee joint: Secondary | ICD-10-CM | POA: Insufficient documentation

## 2023-05-27 DIAGNOSIS — M25552 Pain in left hip: Secondary | ICD-10-CM | POA: Diagnosis not present

## 2023-05-27 DIAGNOSIS — M79605 Pain in left leg: Secondary | ICD-10-CM | POA: Insufficient documentation

## 2023-05-27 DIAGNOSIS — M79652 Pain in left thigh: Secondary | ICD-10-CM | POA: Diagnosis not present

## 2023-05-27 DIAGNOSIS — I10 Essential (primary) hypertension: Secondary | ICD-10-CM | POA: Diagnosis not present

## 2023-05-27 DIAGNOSIS — R350 Frequency of micturition: Secondary | ICD-10-CM

## 2023-05-27 MED ORDER — HYDROCODONE-ACETAMINOPHEN 5-325 MG PO TABS: 1.0000 | ORAL_TABLET | Freq: Four times a day (QID) | ORAL | 0 refills | Status: DC | PRN

## 2023-05-27 MED ORDER — HYDROCODONE-ACETAMINOPHEN 5-325 MG PO TABS
1.0000 | ORAL_TABLET | Freq: Once | ORAL | Status: AC
  Administered 2023-05-27: 1 via ORAL
  Filled 2023-05-27: qty 1

## 2023-05-27 MED ORDER — CIPROFLOXACIN HCL 250 MG PO TABS: 250.0000 mg | ORAL_TABLET | Freq: Two times a day (BID) | ORAL | 0 refills | Status: AC

## 2023-05-27 NOTE — ED Triage Notes (Addendum)
Pt states she was trying to get in her bed and when she lifted her left leg she heard a pop. Pt says she is now unable to bare much weight on left leg.Reports pain is above her left knee and radiates up left thigh

## 2023-05-27 NOTE — Discharge Instructions (Signed)
Begin taking hydrocodone as prescribed as needed for pain.  Weightbearing as tolerated using the walker for support.  Follow-up with primary doctor if not improving in the next week.

## 2023-05-27 NOTE — Telephone Encounter (Signed)
Patient schedule an office visit

## 2023-05-27 NOTE — Telephone Encounter (Signed)
Patient schedule an OV for ED F/U

## 2023-05-27 NOTE — Progress Notes (Signed)
Can we check with lab? It looks like her culture was never sent off. Please see if we can add on if needed.  Regardless, we need to have her come back in 1-2 weeks to recheck SEND OUT urinalysis. Please place future order.  Amanda Mathews. [name], MD 05/27/2023 8:16 AM

## 2023-05-27 NOTE — Telephone Encounter (Signed)
Please see her result note. Ok to send in another round of antibiotics but can we please check on her urine culture?  Amanda Mathews. [name], MD 05/27/2023 8:17 AM

## 2023-05-27 NOTE — ED Provider Notes (Signed)
[place] EMERGENCY DEPARTMENT AT [facility] Provider Note   CSN: [id number] Arrival date & time: 05/27/23  0129     History  Chief Complaint  Patient presents with   Leg Injury    Amanda Mathews is a 77 y.o. female.  Patient is a 77 year old female with past medical history of prior left total knee replacement, hypertension, hypothyroidism.  Patient presenting today with complaints of left leg pain.  She was attempting to transfer into her bed when she felt a crack in her left thigh.  The pain is above the knee and radiating into her hip.  She has been unable to bear any significant weight since the injury.  No other complaints.  No alleviating factors.  The history is provided by the patient.       Home Medications Prior to Admission medications   Medication Sig Start Date End Date Taking? Authorizing Provider  acetaminophen (TYLENOL) 500 MG tablet Take 500 mg by mouth every 6 (six) hours as needed for mild pain.    [provider]  ammonium lactate (AMLACTIN) 12 % cream APPLY THIN COAT TO AFFECTED AREAS & RUB IN TWICE A DAY IN THE MORNING & IN THE EVENING    [provider]  Calcium Carb-Cholecalciferol (CALCIUM 600-D PO) Take 600 mg by mouth daily.    [provider]  carbamazepine (TEGRETOL) 200 MG tablet Take 200 mg by mouth 2 (two) times daily. 5 times daily 200 mg    [provider]  Cholecalciferol (VITAMIN D3) 125 MCG (5000 UT) CAPS Take 5,000 Units by mouth daily.    [provider]  denosumab (PROLIA) 60 MG/ML SOSY injection Inject 60 mg into the skin every 6 (six) months.    [provider]  diphenhydramine-acetaminophen (TYLENOL PM) 25-500 MG TABS tablet Take 1 tablet by mouth at bedtime as needed.    [provider]  gabapentin (NEURONTIN) 300 MG capsule TAKE 1 CAPSULE BY MOUTH THREE TIMES A DAY Patient taking differently: Twice a day 06/29/21   Ardith Dark, MD  hydrochlorothiazide  (HYDRODIURIL) 25 MG tablet TAKE 1 TABLET (25 MG TOTAL) BY MOUTH DAILY. 09/02/22   Ardith Dark, MD  hydrocortisone 2.5 % cream Apply topically daily as needed. 03/26/22   [provider]  levocetirizine (XYZAL) 5 MG tablet TAKE 1 TABLET BY MOUTH EVERY DAY IN THE EVENING 03/17/23   Dulce Sellar, NP  levothyroxine (SYNTHROID) 150 MCG tablet TAKE 1 TABLET BY MOUTH EVERY DAY BEFORE BREAKFAST 03/20/23   Ardith Dark, MD  MAGNESIUM PO Take 1 tablet by mouth 2 (two) times daily. 250 mg    [provider]  meloxicam (MOBIC) 7.5 MG tablet TAKE 1 TABLET BY MOUTH DAILY. PLEASE SCHEDULE AN APPT WITH DR. PARKER FOR FURTHER REFILLS [phone] 05/05/23   Ardith Dark, MD  methocarbamol (ROBAXIN) 750 MG tablet TAKE 1 TABLET BY MOUTH TWICE A DAY AS NEEDED FOR MUSCLE SPASM 05/26/23   Ardith Dark, MD  nystatin cream (MYCOSTATIN) nystatin 100,000 unit/gram topical cream    [provider]  potassium chloride SA (KLOR-CON) 20 MEQ tablet Take 20 mEq by mouth daily.    [provider]  simvastatin (ZOCOR) 40 MG tablet Take 1 tablet (40 mg total) by mouth at bedtime. 03/26/23   Ardith Dark, MD  triamcinolone cream (KENALOG) 0.1 % MIX WITH NYSTATIN AND APPLY A THIN LAYER TO THE AFFECTED AREA TWICE DAILY AS NEEDED    [provider]  Allergies    Codeine, Hydromorphone, Morphine, and Ropinirole    Review of Systems   Review of Systems  All other systems reviewed and are negative.   Physical Exam Updated Vital Signs BP 128/69   Pulse 73   Temp 97.8 F (36.6 C)   Resp 17   Ht 5' (1.524 m)   Wt 83 kg   SpO2 96%   BMI 35.74 kg/m  Physical Exam Vitals and nursing note reviewed.  Constitutional:      Appearance: Normal appearance.  Pulmonary:     Effort: Pulmonary effort is normal.  Musculoskeletal:     Comments: The left knee is grossly normal in appearance.  There is tenderness to palpation in the mid femur extending into the groin.  There is  no palpable or obvious abnormality.  Distal PMS is intact.  She has good range of motion of the knee.  Skin:    General: Skin is warm and dry.  Neurological:     Mental Status: She is alert.     ED Results / Procedures / Treatments   Labs (all labs ordered are listed, but only abnormal results are displayed) Labs Reviewed - No data to display  EKG None  Radiology No results found.  Procedures Procedures    Medications Ordered in ED Medications - No data to display  ED Course/ Medical Decision Making/ A&P  Patient is a 77 year old female presenting with complaints of discomfort in her left thigh.  She was attempting to get into bed and twisted awkwardly on her leg.  She reports hearing a crack when this happened.  X-rays show no evidence for fracture or hardware complication.  Injury seems to be some sort of sprain/strain.  Patient will be given medication for pain and advised to follow-up if not improving.  Final Clinical Impression(s) / ED Diagnoses Final diagnoses:  None    Rx / DC Orders ED Discharge Orders     None         Geoffery Lyons, MD 05/27/23 [phone]

## 2023-05-27 NOTE — ED Notes (Signed)
Pt taken to xray 

## 2023-05-28 NOTE — Telephone Encounter (Signed)
Rx send

## 2023-05-30 ENCOUNTER — Ambulatory Visit (INDEPENDENT_AMBULATORY_CARE_PROVIDER_SITE_OTHER): Payer: Medicare PPO | Admitting: Family Medicine

## 2023-05-30 VITALS — BP 131/84 | HR 64 | Temp 97.7°F | Ht 60.0 in | Wt 183.8 lb

## 2023-05-30 DIAGNOSIS — M79605 Pain in left leg: Secondary | ICD-10-CM

## 2023-05-30 DIAGNOSIS — N390 Urinary tract infection, site not specified: Secondary | ICD-10-CM

## 2023-05-30 DIAGNOSIS — I10 Essential (primary) hypertension: Secondary | ICD-10-CM

## 2023-05-30 DIAGNOSIS — G8929 Other chronic pain: Secondary | ICD-10-CM

## 2023-05-30 MED ORDER — PREDNISONE 50 MG PO TABS: ORAL_TABLET | ORAL | 0 refills | Status: DC

## 2023-05-30 NOTE — Assessment & Plan Note (Signed)
She has had worsening over the last week or so since her injury at home.  No red flag signs or symptoms though is having more radicular pain in her left leg.  We will start prednisone burst.  She will let us know if not improving and she can follow-up with neurosurgery.

## 2023-05-30 NOTE — Patient Instructions (Signed)
It was very nice to see you today!  I think your pain is probably coming from your back.  Please start the prednisone.  Follow-up with Dr. Danielle Dess if not improving.  Return if symptoms worsen or fail to improve.   Take care, Dr [name]  PLEASE NOTE:  If you had any lab tests, please let us know if you have not heard back within a few days. You may see your results on mychart before we have a chance to review them but we will give you a call once they are reviewed by [place].   If we ordered any referrals today, please let us know if you have not heard from their office within the next week.   If you had any urgent prescriptions sent in today, please check with the pharmacy within an hour of our visit to make sure the prescription was transmitted appropriately.   Please try these tips to maintain a healthy lifestyle:  Eat at least 3 REAL meals and 1-2 snacks per day.  Aim for no more than 5 hours between eating.  If you eat breakfast, please do so within one hour of getting up.   Each meal should contain half fruits/vegetables, one quarter protein, and one quarter carbs (no bigger than a computer mouse)  Cut down on sweet beverages. This includes juice, soda, and sweet tea.   Drink at least 1 glass of water with each meal and aim for at least 8 glasses per day  Exercise at least 150 minutes every week.

## 2023-05-30 NOTE — Assessment & Plan Note (Signed)
Blood pressure at goal on HCTZ 12.5 mg once daily.

## 2023-05-30 NOTE — Progress Notes (Signed)
   Amanda Mathews is a 77 y.o. female who presents today for an office visit.  Assessment/Plan:  New/Acute Problems: Left Leg Pain Based on history sounds like more radicular pain related to her chronic lumbar spondylosis. Her exam today is reassuring and her x-rays in the ED were negative as well.  Will be treating her back pain with a prednisone burst as below.  She will let us know if not improving.  Would consider referral to sports medicine if needed.   Urinary tract infection Symptoms resolved with a second round of Cipro.  We will recheck urine culture today as there was no issue with the first 1.  Will hold off on any additional antibiotics at this point.   Chronic Problems Addressed Today: No problem-specific Assessment & Plan notes found for this encounter.     Subjective:  HPI:  See Assessment / plan for status of chronic conditions.  Patient here for ED follow-up.  Went to the ED 3 days ago with left leg pain.  She was trying to transfer at home when she felt a crack in her knee.  In the ED had xrays of her hip, femur, and knee which eere reassuring.  She was given pain meds and discharged home.  Over the last couple of days she has been doing reasonably well. Pain is getting better but she is having some numbness on her left leg. This comes and goes.  Occasionally has pain in the medial aspect of her left leg as well.  She is having more pain in her left lower back as well.  We did here about a week ago for UTI symptoms.  Started her on Cipro.  Unfortunately there was an issue with the lab and her urine culture and this did not result.  She has had significant improvement in her symptoms with the Cipro.       Objective:  Physical Exam: There were no vitals taken for this visit.  Gen: No acute distress, resting comfortably CV: Regular rate and rhythm with no murmurs appreciated Pulm: Normal work of breathing, clear to auscultation bilaterally with no crackles, wheezes,  or rhonchi MUSCULOSKELETAL: - Back: No deformities.  Surgical scar present.  Mild tenderness to bilateral lower lumbar paraspinal muscles - Legs: No deformities.  Full range of motion throughout.  Knee stable to varus and valgus stress.  No joint effusion left hip with good range of motion with internal/external rotation.  Neurovascular intact distally. Neuro: Grossly normal, moves all extremities Psych: Normal affect and thought content      Eidan Muellner M. [name], MD 05/30/2023 7:45 AM

## 2023-06-02 ENCOUNTER — Telehealth: Payer: Self-pay

## 2023-06-02 ENCOUNTER — Telehealth: Payer: Self-pay | Admitting: Family Medicine

## 2023-06-02 NOTE — Telephone Encounter (Signed)
Pt states she was given the wrong walker. It was supposed to me a walker with NO wheels and she was given a walker WITH wheels. Please send an RX for a walker with NO wheels. Please advise.

## 2023-06-02 NOTE — Telephone Encounter (Signed)
Transition Care Management Unsuccessful Follow-up Telephone Call  Date of discharge and from where:  [facility] 6[facility]  Attempts:  1st Attempt  Reason for unsuccessful TCM follow-up call:  Left voice message   Lenard Forth [facility] Guide, [facility] [phone] [address] [place], [place], [place] [address] Phone: [phone] Email: [email or web address]

## 2023-06-02 NOTE — Telephone Encounter (Signed)
Transition Care Management Unsuccessful Follow-up Telephone Call  Date of discharge and from where:  [facility] 6/1  Attempts:  2nd Attempt  Reason for unsuccessful TCM follow-up call:  Left voice message   Male[facility] Guide, [place] [phone] [address], , [place] [address] Phone: [phone] Email: [email or web address]       

## 2023-06-04 ENCOUNTER — Telehealth: Payer: Self-pay | Admitting: Family Medicine

## 2023-06-04 ENCOUNTER — Other Ambulatory Visit: Payer: Self-pay | Admitting: *Deleted

## 2023-06-04 NOTE — Telephone Encounter (Signed)
Patient requests to be called to be given Lab results

## 2023-06-04 NOTE — Telephone Encounter (Signed)
Spoke with [facility]  at [phone] Need new prescription  Folding Dan Humphreys with out wheels  Fax to [phone]

## 2023-06-04 NOTE — Telephone Encounter (Signed)
Patient advise, results not in Will call when pcp sign results

## 2023-06-05 NOTE — Telephone Encounter (Signed)
Ok with me. Please place any necessary orders. 

## 2023-06-05 NOTE — Telephone Encounter (Signed)
Rx faxed to [phone]

## 2023-06-06 ENCOUNTER — Ambulatory Visit (HOSPITAL_COMMUNITY): Payer: Medicare PPO | Admitting: Occupational Therapy

## 2023-06-06 DIAGNOSIS — M79621 Pain in right upper arm: Secondary | ICD-10-CM | POA: Diagnosis not present

## 2023-06-06 DIAGNOSIS — M79601 Pain in right arm: Secondary | ICD-10-CM | POA: Diagnosis not present

## 2023-06-06 DIAGNOSIS — M25611 Stiffness of right shoulder, not elsewhere classified: Secondary | ICD-10-CM

## 2023-06-06 DIAGNOSIS — R29898 Other symptoms and signs involving the musculoskeletal system: Secondary | ICD-10-CM | POA: Diagnosis not present

## 2023-06-06 NOTE — Therapy (Signed)
OUTPATIENT OCCUPATIONAL THERAPY ORTHO EVALUATION  Patient Name: Amanda Mathews MRN: [id number] DOB:18-Jan-1946, 77 y.o., female Today's Date: 06/06/2023  PCP: Ardith Dark, MD REFERRING PROVIDER: Ardith Dark, MD  END OF SESSION:  OT End of Session - 06/06/23 1600     Visit Number 2    Number of Visits 9    Date for OT Re-Evaluation 06/20/23    Authorization Type [employer]    Authorization Time Period 8 visits approved (05/20/23-06/20/23)    Authorization - Visit Number 1    Authorization - Number of Visits 8    OT Start Time 0950    OT Stop Time 1033    OT Time Calculation (min) 43 min    Activity Tolerance Patient tolerated treatment well    Behavior During Therapy [facility] for tasks assessed/performed             Past Medical History:  Diagnosis Date   Cataract    Osteoporosis    Sleep apnea    Thyroid disease    Past Surgical History:  Procedure Laterality Date   back stimulator      BUNIONECTOMY     CHOLECYSTECTOMY     FEMUR FRACTURE SURGERY     REPLACEMENT TOTAL KNEE BILATERAL Bilateral    Patient Active Problem List   Diagnosis Date Noted   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   Spinal stenosis of lumbar region 04/27/2023   Trigeminal neuralgia 04/27/2023   Leg edema 04/17/2023   Dystonia 04/29/2022   Hypokalemia 01/23/2021   Chronic low back pain 07/17/2020   Essential hypertension 07/17/2020   Hypothyroidism 07/17/2020   Dyslipidemia 07/17/2020   Osteoporosis 07/17/2020   Depression, major, single episode, 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 Additionally her PMH significant for Osteoporosis, Spinal Stimulator, and HTN.  PRECAUTIONS: None  WEIGHT BEARING RESTRICTIONS: No  PAIN:  Are you having pain? Yes: NPRS scale: 3/10 Pain location: Bicep Region Pain description: aching Aggravating factors: movement Relieving factors: Tylenol  FALLS: Has patient fallen in last 6 months? Yes. Number of falls 2  LIVING ENVIRONMENT: Lives with: lives alone Lives in: House/apartment  PLOF: Independent with household mobility with device  PATIENT GOALS: To reduce pain  NEXT MD VISIT: 05/22/23  OBJECTIVE:   HAND DOMINANCE: Right  ADLs: Overall ADLs: Pt has difficulties with donning her bra and reaching back to wash/dry her back. Unable to lift items for cooking and cleaning.   FUNCTIONAL OUTCOME MEASURES: FOTO: 54.38  UPPER EXTREMITY ROM:     Active ROM Right eval  Shoulder flexion 144  Shoulder abduction 138  Shoulder internal rotation 90  Shoulder external rotation 50  (Blank rows = not tested)   UPPER EXTREMITY MMT:     MMT Right eval  Shoulder flexion 4/5  Shoulder abduction 3+/5  Shoulder adduction 4/5  Shoulder extension 4/5  Shoulder internal rotation 3+/5  Shoulder external rotation 3+/5  Elbow flexion 4+/5  Elbow extension 5/5  (Blank rows = not tested)  SENSATION: Intermittent numbness or tingling in R hand  EDEMA: No swelling noted  OBSERVATIONS: Moderate fascial restrictions along the biceps, flexors and extensors of forearm, and upper trapezius.    TODAY'S TREATMENT:                                                                                                                              DATE:  06/06/23 -Manual Therapy: myofascial release and trigger point applied to the biceps, deltoid, anterior shoulder girdle, and trapezius in order to reduce fascial restrictions and pain to  improve ROM. -AA/ROM: supine, flexion, abduction, protraction, horizontal abduction, er/IR, x10 -Forearm stretch: flexion, extension, 4x20" -Bicep Curls x10, unweighted -Scapular ROM: elevation/depression, retraction/protraction, rows, x10   PATIENT EDUCATION: Education details: AA/ROM and bicep stretches Person educated: Patient Education method: Explanation Education comprehension: verbalized understanding  HOME EXERCISE PROGRAM: 6/21: AA/ROM and bicep stretches  GOALS: Goals reviewed with patient? Yes  SHORT TERM GOALS: Target date: 06/20/23  Pt will be provided and educated on HEP for RUE mobility during ADL completion.  Goal status: IN PROGRESS  2.  Pt will decrease pain to 2/10 in RUE in order to sleep for 2+ consecutive hours without waking due to pain.   Goal status: IN PROGRESS  3.  Pt will decrease fascial restrictions in RUE to minimal amounts or less in order to complete reaching tasks.  Goal status: IN PROGRESS  4.  Pt will increase RUE ROM to [facility] in order to complete overhead and behind the back reaching during dressing and bathing.   Goal status: IN PROGRESS  5.  Pt will improve RUE to 4+/5 in order to complete lifting tasks during cooking and cleaning.   Goal status: IN PROGRESS   ASSESSMENT:  CLINICAL IMPRESSION: This session pt demonstrated improved ROM and decreased pain. She was able to achieve full ROM with AA/ROM. Additionally, this session she completed scapular ROM in preparation for scapular strengthening and stabilization. OT provided verbal and tactile cuing for positioning and technique throughout entire session.   PERFORMANCE DEFICITS: in functional skills including ADLs, IADLs, ROM, strength, pain, fascial restrictions, Gross motor control, body mechanics, and UE functional use.   PLAN:  OT FREQUENCY: 2x/week  OT DURATION: 4 weeks  PLANNED INTERVENTIONS: self care/ADL training, therapeutic exercise, therapeutic activity, manual  therapy, passive range of motion, functional mobility training, electrical stimulation, ultrasound, moist heat, patient/family education, energy conservation, coping strategies training, and DME and/or AE instructions  RECOMMENDED OTHER SERVICES: N/A  CONSULTED AND AGREED WITH PLAN OF CARE: Patient  PLAN FOR NEXT SESSION: Manual Therapy, AA/ROM, Wall Slides, Bicep Tendonitis stretches, start A/ROM and scapular Strengthening   Lorena Clearman Bing Plume, OTR/L [facility] [phone] Kalen Ratajczak Rosemarie Beath, OT 06/06/2023, 4:02 PM

## 2023-06-06 NOTE — Patient Instructions (Signed)

## 2023-06-09 ENCOUNTER — Other Ambulatory Visit: Payer: Self-pay | Admitting: Family Medicine

## 2023-06-09 MED ORDER — MELOXICAM 7.5 MG PO TABS: 7.5000 mg | ORAL_TABLET | Freq: Every day | ORAL | 0 refills | Status: DC

## 2023-06-10 ENCOUNTER — Other Ambulatory Visit: Payer: Self-pay | Admitting: Family Medicine

## 2023-06-10 ENCOUNTER — Encounter: Payer: Self-pay | Admitting: Family Medicine

## 2023-06-10 ENCOUNTER — Encounter (HOSPITAL_COMMUNITY): Payer: Medicare PPO | Admitting: Occupational Therapy

## 2023-06-10 NOTE — Telephone Encounter (Signed)
Please advise 

## 2023-06-11 NOTE — Telephone Encounter (Signed)
She can try imodium.  Recommend office visit if not improving.  Katina Degree. [name], MD 06/11/2023 7:23 AM

## 2023-06-12 ENCOUNTER — Ambulatory Visit (INDEPENDENT_AMBULATORY_CARE_PROVIDER_SITE_OTHER): Payer: Medicare PPO | Admitting: Family Medicine

## 2023-06-12 ENCOUNTER — Encounter: Payer: Self-pay | Admitting: Family Medicine

## 2023-06-12 VITALS — BP 110/70 | HR 65 | Temp 97.5°F | Ht 60.0 in | Wt 179.4 lb

## 2023-06-12 DIAGNOSIS — I10 Essential (primary) hypertension: Secondary | ICD-10-CM | POA: Diagnosis not present

## 2023-06-12 DIAGNOSIS — M545 Low back pain, unspecified: Secondary | ICD-10-CM

## 2023-06-12 DIAGNOSIS — G8929 Other chronic pain: Secondary | ICD-10-CM

## 2023-06-12 DIAGNOSIS — E785 Hyperlipidemia, unspecified: Secondary | ICD-10-CM | POA: Diagnosis not present

## 2023-06-12 MED ORDER — ONDANSETRON HCL 4 MG PO TABS: 4.0000 mg | ORAL_TABLET | Freq: Three times a day (TID) | ORAL | 0 refills | Status: DC | PRN

## 2023-06-12 NOTE — Progress Notes (Signed)
   Amanda Mathews is a 77 y.o. female who presents today for an office visit.  Assessment/Plan:  New/Acute Problems: Gastroenteritis Reassuring exam today and symptoms seem to be resolving.  We discussed importance of good hydration.  She will slowly advance her diet over the next several days.  We will send in a small supply of Zofran to use as needed in case symptoms return however hopefully she will continue to recover without any further complication.  We did discuss reasons to return to care.  Follow-up as needed.  Chronic Problems Addressed Today: Chronic low back pain Pain is better since prednisone burst 1 week ago.  She has follow-up with neurosurgery in a month.  Essential hypertension Blood pressure at goal on HCTZ 12.5 mg once daily.     Subjective:  HPI:  See Assessment / plan for status of chronic conditions.  Patient is here today with diarrhea for the last 4 days.  Some vomiting as well initially. Vomiting resolved. She did not eat breakfast this morning. Some decreased appetite. No diarrhea since yesterday morning. She did have some abdominal pain but this has resolved. No treatments tried. No fevers or chills.        Objective:  Physical Exam: BP 110/70 (BP Location: Left Arm, Patient Position: Sitting, Cuff Size: Large)   Pulse 65   Temp (!) 97.5 F (36.4 C) (Temporal)   Ht 5' (1.524 m)   Wt 179 lb 6.1 oz (81.4 kg)   SpO2 95%   BMI 35.03 kg/m   Wt Readings from Last 3 Encounters:  06/12/23 179 lb 6.1 oz (81.4 kg)  05/30/23 183 lb 12.8 oz (83.4 kg)  05/27/23 182 lb 15.7 oz (83 kg)    Gen: No acute distress, resting comfortably CV: Regular rate and rhythm with no murmurs appreciated Pulm: Normal work of breathing, clear to auscultation bilaterally with no crackles, wheezes, or rhonchi Abdomen: Bowel sounds present, soft, nontender, nondistended. Neuro: Grossly normal, moves all extremities Psych: Normal affect and thought content      Eliga Arvie M.  [name], MD 06/12/2023 9:41 AM

## 2023-06-12 NOTE — Assessment & Plan Note (Signed)
Pain is better since prednisone burst 1 week ago.  She has follow-up with neurosurgery in a month.

## 2023-06-12 NOTE — Assessment & Plan Note (Signed)
Blood pressure at goal on HCTZ 12.5 mg once daily. 

## 2023-06-12 NOTE — Patient Instructions (Addendum)
It was very nice to see you today!  I think you have a stomach bug.  This seems to be resolving.  You can use the Zofran if needed.  Let us know if your symptoms return.  Return if symptoms worsen or fail to improve.   Take care, Dr [name]  PLEASE NOTE:  If you had any lab tests, please let us know if you have not heard back within a few days. You may see your results on mychart before we have a chance to review them but we will give you a call once they are reviewed by [place].   If we ordered any referrals today, please let us know if you have not heard from their office within the next week.   If you had any urgent prescriptions sent in today, please check with the pharmacy within an hour of our visit to make sure the prescription was transmitted appropriately.   Please try these tips to maintain a healthy lifestyle:  Eat at least 3 REAL meals and 1-2 snacks per day.  Aim for no more than 5 hours between eating.  If you eat breakfast, please do so within one hour of getting up.   Each meal should contain half fruits/vegetables, one quarter protein, and one quarter carbs (no bigger than a computer mouse)  Cut down on sweet beverages. This includes juice, soda, and sweet tea.   Drink at least 1 glass of water with each meal and aim for at least 8 glasses per day  Exercise at least 150 minutes every week.

## 2023-06-13 ENCOUNTER — Ambulatory Visit (HOSPITAL_COMMUNITY): Payer: Medicare PPO | Admitting: Occupational Therapy

## 2023-06-13 ENCOUNTER — Encounter (HOSPITAL_COMMUNITY): Payer: Self-pay | Admitting: Occupational Therapy

## 2023-06-13 DIAGNOSIS — M25611 Stiffness of right shoulder, not elsewhere classified: Secondary | ICD-10-CM

## 2023-06-13 DIAGNOSIS — R29898 Other symptoms and signs involving the musculoskeletal system: Secondary | ICD-10-CM

## 2023-06-13 DIAGNOSIS — M79621 Pain in right upper arm: Secondary | ICD-10-CM | POA: Diagnosis not present

## 2023-06-13 DIAGNOSIS — M79601 Pain in right arm: Secondary | ICD-10-CM | POA: Diagnosis not present

## 2023-06-13 NOTE — Therapy (Signed)
OUTPATIENT OCCUPATIONAL THERAPY ORTHO TREATMENT NOTE  Patient Name: Amanda Mathews MRN: [id number] DOB:[birth year], 77 y.o., female Today's Date: 06/13/2023  PCP: Ardith Dark, MD REFERRING PROVIDER: Ardith Dark, MD  END OF SESSION:  OT End of Session - 06/13/23 0953     Visit Number 3    Number of Visits 9    Date for OT Re-Evaluation 06/20/23    Authorization Type [employer]    Authorization Time Period 8 visits approved (05/20/23-06/20/23)    Authorization - Visit Number 2    Authorization - Number of Visits 8    OT Start Time 0950    OT Stop Time 1037    OT Time Calculation (min) 47 min    Activity Tolerance Patient tolerated treatment well    Behavior During Therapy [facility] for tasks assessed/performed             Past Medical History:  Diagnosis Date   Cataract    Osteoporosis    Sleep apnea    Thyroid disease    Past Surgical History:  Procedure Laterality Date   back stimulator      BUNIONECTOMY     CHOLECYSTECTOMY     FEMUR FRACTURE SURGERY     REPLACEMENT TOTAL KNEE BILATERAL Bilateral    Patient Active Problem List   Diagnosis Date Noted   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   Spinal stenosis of lumbar region 04/27/2023   Trigeminal neuralgia 04/27/2023   Leg edema 04/17/2023   Dystonia 04/29/2022   Hypokalemia 01/23/2021   Chronic low back pain 07/17/2020   Essential hypertension 07/17/2020   Hypothyroidism 07/17/2020   Dyslipidemia 07/17/2020   Osteoporosis 07/17/2020   Depression, major, single episode, 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 Additionally her PMH significant for Osteoporosis, Spinal Stimulator, and HTN.  PRECAUTIONS: None  WEIGHT BEARING RESTRICTIONS: No  PAIN:  Are you having pain? Yes: NPRS scale: 2/10 Pain location: Bicep Region Pain description: sore Aggravating factors: movement Relieving factors: Tylenol  FALLS: Has patient fallen in last 6 months? Yes. Number of falls 2  LIVING ENVIRONMENT: Lives with: lives alone Lives in: House/apartment  PLOF: Independent with household mobility with device  PATIENT GOALS: To reduce pain  NEXT MD VISIT: 05/22/23  OBJECTIVE:   HAND DOMINANCE: Right  ADLs: Overall ADLs: Pt has difficulties with donning her bra and reaching back to wash/dry her back. Unable to lift items for cooking and cleaning.   FUNCTIONAL OUTCOME MEASURES: FOTO: 54.38  UPPER EXTREMITY ROM:     Active ROM Right eval  Shoulder flexion 144  Shoulder abduction 138  Shoulder internal rotation 90  Shoulder external rotation 50  (Blank rows = not tested)   UPPER EXTREMITY MMT:     MMT Right eval  Shoulder flexion 4/5  Shoulder abduction 3+/5  Shoulder adduction 4/5  Shoulder extension 4/5  Shoulder internal rotation 3+/5  Shoulder external rotation 3+/5  Elbow flexion 4+/5  Elbow extension 5/5  (Blank rows = not tested)  SENSATION: Intermittent numbness or tingling in R hand  EDEMA: No swelling noted  OBSERVATIONS: Moderate fascial restrictions along the biceps, flexors and extensors of forearm, and upper trapezius.    TODAY'S TREATMENT:                                                                                                                              DATE:  06/13/23 -Manual Therapy: myofascial release and trigger point applied to the biceps, deltoid, anterior shoulder girdle, and trapezius in order to reduce fascial restrictions and pain to  improve ROM. -AA/ROM: supine, 2lb dowel, flexion, abduction, protraction, horizontal abduction, er/IR, x12 -A/ROM: supine, flexion, abduction, protraction, horizontal abduction, er/IR, x10 -Proximal Shoulder Exercises: paddle, criss cross, circle both directions, x10  06/06/23 -Manual Therapy: myofascial release and trigger point applied to the biceps, deltoid, anterior shoulder girdle, and trapezius in order to reduce fascial restrictions and pain to improve ROM. -AA/ROM: supine, flexion, abduction, protraction, horizontal abduction, er/IR, x10 -Forearm stretch: flexion, extension, 4x20" -Bicep Curls x10, unweighted -Scapular ROM: elevation/depression, retraction/protraction, rows, x10   PATIENT EDUCATION: Education details: A/ROM Person educated: Patient Education method: Explanation Education comprehension: verbalized understanding  HOME EXERCISE PROGRAM: 6/21: AA/ROM and bicep stretches  GOALS: Goals reviewed with patient? Yes  SHORT TERM GOALS: Target date: 06/20/23  Pt will be provided and educated on HEP for RUE mobility during ADL completion.  Goal status: IN PROGRESS  2.  Pt will decrease pain to 2/10 in RUE in order to sleep for 2+ consecutive hours without waking due to pain.   Goal status: IN PROGRESS  3.  Pt will decrease fascial restrictions in RUE to minimal amounts or less in order to complete reaching tasks.  Goal status: IN PROGRESS  4.  Pt will increase RUE ROM to [facility] in order to complete overhead and behind the back reaching during dressing and bathing.   Goal status: IN PROGRESS  5.  Pt will improve RUE to 4+/5 in order to complete lifting tasks during cooking and cleaning.   Goal status: IN PROGRESS   ASSESSMENT:  CLINICAL IMPRESSION: This session pt returning from recently being sick and feeling weaker than normal. She was reaching full AA/ROM and 75% of A/ROM, however she was fatiguing quickly and requiring frequent rest breaks. Pt only able to  tolerate lower level activities this session. Verbal and visual cuing provided for positioning and technique.   PERFORMANCE DEFICITS: in functional skills including ADLs, IADLs, ROM, strength, pain, fascial restrictions, Gross motor control, body mechanics, and UE functional use.   PLAN:  OT FREQUENCY: 2x/week  OT DURATION: 4 weeks  PLANNED INTERVENTIONS: self care/ADL training, therapeutic exercise, therapeutic activity, manual therapy, passive range of motion, functional mobility training, electrical stimulation, ultrasound, moist heat, patient/family education, energy conservation, coping strategies training, and DME and/or AE instructions  RECOMMENDED OTHER SERVICES: N/A  CONSULTED AND AGREED WITH PLAN OF CARE: Patient  PLAN FOR NEXT SESSION: Manual Therapy, AA/ROM, Wall Slides,  Bicep Tendonitis stretches, start A/ROM and scapular Strengthening   Malyna Budney Bing Plume, OTR/L [facility] [phone] Kennyth Arnold, OT 06/13/2023, 8:53 PM

## 2023-06-13 NOTE — Patient Instructions (Signed)

## 2023-06-17 ENCOUNTER — Ambulatory Visit (HOSPITAL_COMMUNITY): Payer: Medicare PPO | Attending: Family Medicine | Admitting: Occupational Therapy

## 2023-06-17 DIAGNOSIS — M25611 Stiffness of right shoulder, not elsewhere classified: Secondary | ICD-10-CM | POA: Diagnosis not present

## 2023-06-17 DIAGNOSIS — M79621 Pain in right upper arm: Secondary | ICD-10-CM | POA: Diagnosis not present

## 2023-06-17 DIAGNOSIS — R29898 Other symptoms and signs involving the musculoskeletal system: Secondary | ICD-10-CM | POA: Diagnosis not present

## 2023-06-17 NOTE — Therapy (Signed)
OUTPATIENT OCCUPATIONAL THERAPY ORTHO TREATMENT NOTE  Patient Name: Amanda Mathews MRN: [id number] DOB:[birth year], 77 y.o., female Today's Date: 06/17/2023  PCP: Ardith Dark, MD REFERRING PROVIDER: Ardith Dark, MD  END OF SESSION:  OT End of Session - 06/17/23 1207     Visit Number 4    Number of Visits 9    Date for OT Re-Evaluation 06/20/23    Authorization Type [employer]    Authorization Time Period 8 visits approved (05/20/23-06/20/23)    Authorization - Visit Number 3    Authorization - Number of Visits 8    OT Start Time 1036    OT Stop Time 1117    OT Time Calculation (min) 41 min    Activity Tolerance Patient tolerated treatment well    Behavior During Therapy [facility] for tasks assessed/performed             Past Medical History:  Diagnosis Date   Cataract    Osteoporosis    Sleep apnea    Thyroid disease    Past Surgical History:  Procedure Laterality Date   back stimulator      BUNIONECTOMY     CHOLECYSTECTOMY     FEMUR FRACTURE SURGERY     REPLACEMENT TOTAL KNEE BILATERAL Bilateral    Patient Active Problem List   Diagnosis Date Noted   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   Spinal stenosis of lumbar region 04/27/2023   Trigeminal neuralgia 04/27/2023   Leg edema 04/17/2023   Dystonia 04/29/2022   Hypokalemia 01/23/2021   Chronic low back pain 07/17/2020   Essential hypertension 07/17/2020   Hypothyroidism 07/17/2020   Dyslipidemia 07/17/2020   Osteoporosis 07/17/2020   Depression, major, single episode, 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   Osteoarthritis 07/17/2020   OSA on CPAP 07/17/2020    ONSET DATE: 05/01/23  REFERRING DIAG: RUE Pain  THERAPY DIAG:  Pain in right upper arm  Shoulder stiffness, right  Other symptoms and signs involving the musculoskeletal system  Rationale for Evaluation and Treatment: Rehabilitation  SUBJECTIVE:   SUBJECTIVE STATEMENT: "Will I ever feel less weak?" Pt accompanied by: self  PERTINENT  HISTORY: Pt has had multiple falls in the last 2 months, the last one resulting in a face laceration and RUE pain. Additionally her PMH significant for Osteoporosis, Spinal Stimulator, and HTN.  PRECAUTIONS: None  WEIGHT BEARING RESTRICTIONS: No  PAIN:  Are you having pain? Yes: NPRS scale: 1/10 Pain location: Bicep Region Pain description: sore Aggravating factors: movement Relieving factors: Tylenol  FALLS: Has patient fallen in last 6 months? Yes. Number of falls 2  LIVING ENVIRONMENT: Lives with: lives alone Lives in: House/apartment  PLOF: Independent with household mobility with device  PATIENT GOALS: To reduce pain  NEXT MD VISIT: 05/22/23  OBJECTIVE:   HAND DOMINANCE: Right  ADLs: Overall ADLs: Pt has difficulties with donning her bra and reaching back to wash/dry her back. Unable to lift items for cooking and cleaning.   FUNCTIONAL OUTCOME MEASURES: FOTO: 54.38  UPPER EXTREMITY ROM:     Active ROM Right eval  Shoulder flexion 144  Shoulder abduction 138  Shoulder internal rotation 90  Shoulder external rotation 50  (Blank rows = not tested)   UPPER EXTREMITY MMT:     MMT Right eval  Shoulder flexion 4/5  Shoulder abduction 3+/5  Shoulder adduction 4/5  Shoulder extension 4/5  Shoulder internal rotation 3+/5  Shoulder external rotation 3+/5  Elbow flexion 4+/5  Elbow extension 5/5  (Blank rows = not tested)  SENSATION: Intermittent numbness or tingling in R hand  EDEMA: No swelling noted  OBSERVATIONS: Moderate fascial restrictions along the biceps, flexors and extensors of forearm, and upper trapezius.    TODAY'S TREATMENT:                                                                                                                              DATE:  06/17/23 -Manual Therapy: myofascial release and trigger point applied to the biceps, deltoid, anterior shoulder girdle, and trapezius in order to reduce fascial restrictions and pain to  improve ROM. -A/ROM: supine, flexion, abduction, protraction, horizontal abduction, er/IR, x15 -Shoulder strengthening, seated, 1lb dumbbells, flexion, abduction, protraction, horizontal abduction, er/IR, x10 -Scapular Strengthening: yellow band, extension, retraction, rows, x10 -Theraband exercises: yellow band, tricep presses, bicep curls, x10  06/13/23 -Manual Therapy: myofascial release and trigger point applied to the biceps, deltoid, anterior shoulder girdle, and trapezius in order to reduce fascial restrictions and pain to improve ROM. -AA/ROM: supine, 2lb dowel, flexion, abduction, protraction, horizontal abduction, er/IR, x12 -A/ROM: supine, flexion, abduction, protraction, horizontal abduction, er/IR, x10 -Proximal Shoulder Exercises: paddle, criss cross, circle both directions, x10  06/06/23 -Manual Therapy: myofascial release and trigger point applied to the biceps, deltoid, anterior shoulder girdle, and trapezius in order to reduce fascial restrictions and pain to improve ROM. -AA/ROM: supine, flexion, abduction, protraction, horizontal abduction, er/IR, x10 -Forearm stretch: flexion, extension, 4x20" -Bicep Curls x10, unweighted -Scapular ROM: elevation/depression, retraction/protraction, rows, x10   PATIENT EDUCATION: Education details: started scap strengthening (will assess proper movement 1 more time before adding to HEP) Person educated: Patient Education method: Explanation Education comprehension: verbalized understanding  HOME EXERCISE PROGRAM: 6/21: AA/ROM and bicep stretches 6/28: A/ROM  GOALS: Goals reviewed with patient? Yes  SHORT TERM GOALS: Target date: 06/20/23  Pt will be provided and educated on HEP for RUE mobility during ADL completion.  Goal status: IN PROGRESS  2.  Pt will decrease pain to 2/10 in RUE in order to sleep for 2+ consecutive hours without waking due to pain.   Goal status: IN PROGRESS  3.  Pt will decrease fascial restrictions in  RUE to minimal amounts or less in order to complete reaching tasks.  Goal status: IN PROGRESS  4.  Pt will increase RUE ROM to [facility] in order to complete overhead and behind the back reaching during dressing and bathing.   Goal status: IN PROGRESS  5.  Pt will improve RUE to 4+/5 in order to complete lifting tasks during cooking and cleaning.   Goal status: IN PROGRESS   ASSESSMENT:  CLINICAL IMPRESSION: Pt presenting with improved endurance this session. She was able to complete A/ROM and shoulder strengthening in sitting with 85% of full ROM. She fatigued with strengthening using the 1lb dumbbells, however with short rest breaks in between exercises, she was able to complete all tasks. OT added scapula strengthening and bicep/tricep strengthening this session, which she tolerated well, however required mod cuing for  proper movement pattern throughout. Verbal and tactile cuing provided throughout for positioning and technique.   PERFORMANCE DEFICITS: in functional skills including ADLs, IADLs, ROM, strength, pain, fascial restrictions, Gross motor control, body mechanics, and UE functional use.   PLAN:  OT FREQUENCY: 2x/week  OT DURATION: 4 weeks  PLANNED INTERVENTIONS: self care/ADL training, therapeutic exercise, therapeutic activity, manual therapy, passive range of motion, functional mobility training, electrical stimulation, ultrasound, moist heat, patient/family education, energy conservation, coping strategies training, and DME and/or AE instructions  RECOMMENDED OTHER SERVICES: N/A  CONSULTED AND AGREED WITH PLAN OF CARE: Patient  PLAN FOR NEXT SESSION: Manual Therapy, AA/ROM, Wall Slides, Bicep Tendonitis stretches, start A/ROM and scapular Strengthening   Palin Tristan Bing Plume, OTR/L [facility] [phone] Oliver Heitzenrater Rosemarie Beath, OT 06/17/2023, 12:08 PM

## 2023-06-20 ENCOUNTER — Ambulatory Visit (HOSPITAL_COMMUNITY): Payer: Medicare PPO | Admitting: Occupational Therapy

## 2023-06-20 ENCOUNTER — Encounter (HOSPITAL_COMMUNITY): Payer: Self-pay | Admitting: Occupational Therapy

## 2023-06-20 DIAGNOSIS — M25611 Stiffness of right shoulder, not elsewhere classified: Secondary | ICD-10-CM | POA: Diagnosis not present

## 2023-06-20 DIAGNOSIS — R29898 Other symptoms and signs involving the musculoskeletal system: Secondary | ICD-10-CM

## 2023-06-20 DIAGNOSIS — M79621 Pain in right upper arm: Secondary | ICD-10-CM | POA: Diagnosis not present

## 2023-06-20 NOTE — Therapy (Signed)
OUTPATIENT OCCUPATIONAL THERAPY ORTHO TREATMENT NOTE  Patient Name: Amanda Mathews MRN: [id number] DOB:July 22, 1946, 77 y.o., female Today's Date: 06/20/2023  PCP: Ardith Dark, MD REFERRING PROVIDER: Ardith Dark, MD  Progress Note Reporting Period 05/20/23 to 06/20/23  See note below for Objective Data and Assessment of Progress/Goals.    END OF SESSION:  OT End of Session - 06/20/23 1217     Visit Number 5    Number of Visits 9    Date for OT Re-Evaluation 07/18/23    Authorization Type [employer]    Authorization Time Period 8 visits approved (05/20/23-06/20/23), requesting 8 more visits    Authorization - Visit Number 4    Authorization - Number of Visits 8    OT Start Time 1118    OT Stop Time 1158    OT Time Calculation (min) 40 min    Activity Tolerance Patient tolerated treatment well    Behavior During Therapy WFL for tasks assessed/performed             Past Medical History:  Diagnosis Date   Cataract    Osteoporosis    Sleep apnea    Thyroid disease    Past Surgical History:  Procedure Laterality Date   back stimulator      BUNIONECTOMY     CHOLECYSTECTOMY     FEMUR FRACTURE SURGERY     REPLACEMENT TOTAL KNEE BILATERAL Bilateral    Patient Active Problem List   Diagnosis Date Noted   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   Spinal stenosis of lumbar region 04/27/2023   Trigeminal neuralgia 04/27/2023   Leg edema 04/17/2023   Dystonia 04/29/2022   Hypokalemia 01/23/2021   Chronic low back pain 07/17/2020   Essential hypertension 07/17/2020   Hypothyroidism 07/17/2020   Dyslipidemia 07/17/2020   Osteoporosis 07/17/2020   Depression, major, single episode, 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   Osteoarthritis 07/17/2020   OSA on CPAP 07/17/2020    ONSET DATE: 05/01/23  REFERRING DIAG: RUE Pain  THERAPY DIAG:  Pain in right upper arm  Shoulder stiffness, right  Other symptoms and signs involving the musculoskeletal system  Rationale for  Evaluation and Treatment: Rehabilitation  SUBJECTIVE:   SUBJECTIVE STATEMENT: "I'm really trying to exercise my arm more at the gym. " Pt accompanied by: self  PERTINENT HISTORY: Pt has had multiple falls in the last 2 months, the last one resulting in a face laceration and RUE pain. Additionally her PMH significant for Osteoporosis, Spinal Stimulator, and HTN.  PRECAUTIONS: None  WEIGHT BEARING RESTRICTIONS: No  PAIN:  Are you having pain? Yes: NPRS scale: 1/10 Pain location: Bicep Region Pain description: sore Aggravating factors: movement Relieving factors: Tylenol  FALLS: Has patient fallen in last 6 months? Yes. Number of falls 2  LIVING ENVIRONMENT: Lives with: lives alone Lives in: House/apartment  PLOF: Independent with household mobility with device  PATIENT GOALS: To reduce pain  NEXT MD VISIT: 05/22/23  OBJECTIVE:   HAND DOMINANCE: Right  ADLs: Overall ADLs: Pt has difficulties with donning her bra and reaching back to wash/dry her back. Unable to lift items for cooking and cleaning.   FUNCTIONAL OUTCOME MEASURES: FOTO: 54.38 06/20/23: 68.89  UPPER EXTREMITY ROM:     Active ROM Right eval Right 06/20/23  Shoulder flexion 144 151  Shoulder abduction 138 169  Shoulder internal rotation 90 90  Shoulder external rotation 50 78  (Blank rows = not tested)   UPPER EXTREMITY MMT:     MMT Right eval Right  06/20/23  Shoulder flexion 4/5 4+/5  Shoulder abduction 3+/5 4-/5  Shoulder adduction 4/5 5/5  Shoulder extension 4/5 4+/5  Shoulder internal rotation 3+/5 4/5  Shoulder external rotation 3+/5 4/5  Elbow flexion 4+/5 4+/5  Elbow extension 5/5 5/5  (Blank rows = not tested)  SENSATION: Intermittent numbness or tingling in R hand  EDEMA: No swelling noted  OBSERVATIONS: Moderate fascial restrictions along the biceps, flexors and extensors of forearm, and upper trapezius.    TODAY'S TREATMENT:                                                                                                                               DATE:  06/20/23 -Manual Therapy: myofascial release and trigger point applied to the biceps, deltoid, anterior shoulder girdle, and trapezius in order to reduce fascial restrictions and pain to improve ROM. -A/ROM: supine, flexion, abduction, protraction, horizontal abduction, er/IR, x15 -Scapular Strengthening: red band, extension, retraction, rows, x15 -Theraband exercises: red band, tricep presses, bicep curls, x15  06/17/23 -Manual Therapy: myofascial release and trigger point applied to the biceps, deltoid, anterior shoulder girdle, and trapezius in order to reduce fascial restrictions and pain to improve ROM. -A/ROM: supine, flexion, abduction, protraction, horizontal abduction, er/IR, x15 -Shoulder strengthening, seated, 1lb dumbbells, flexion, abduction, protraction, horizontal abduction, er/IR, x10 -Scapular Strengthening: yellow band, extension, retraction, rows, x10 -Theraband exercises: yellow band, tricep presses, bicep curls, x10  06/13/23 -Manual Therapy: myofascial release and trigger point applied to the biceps, deltoid, anterior shoulder girdle, and trapezius in order to reduce fascial restrictions and pain to improve ROM. -AA/ROM: supine, 2lb dowel, flexion, abduction, protraction, horizontal abduction, er/IR, x12 -A/ROM: supine, flexion, abduction, protraction, horizontal abduction, er/IR, x10 -Proximal Shoulder Exercises: paddle, criss cross, circle both directions, x10   PATIENT EDUCATION: Education details: Scap strengthening (red) Person educated: Patient Education method: Explanation Education comprehension: verbalized understanding  HOME EXERCISE PROGRAM: 6/21: AA/ROM and bicep stretches 6/28: A/ROM 7/5: Scap strengthening (red)  GOALS: Goals reviewed with patient? Yes  SHORT TERM GOALS: Target date: 06/20/23  Pt will be provided and educated on HEP for RUE mobility during ADL  completion.  Goal status: IN PROGRESS  2.  Pt will decrease pain to 2/10 in RUE in order to sleep for 2+ consecutive hours without waking due to pain.   Goal status: IN PROGRESS  3.  Pt will decrease fascial restrictions in RUE to minimal amounts or less in order to complete reaching tasks.  Goal status: IN PROGRESS  4.  Pt will increase RUE ROM to [facility] in order to complete overhead and behind the back reaching during dressing and bathing.   Goal status: IN PROGRESS  5.  Pt will improve RUE to 4+/5 in order to complete lifting tasks during cooking and cleaning.   Goal status: IN PROGRESS   ASSESSMENT:  CLINICAL IMPRESSION: This session, pt presented to OT for her reassessment. She has achieved full ROM and has made improvements with  strength in all motions tested. Additionally, she feels like most of her ADL's and IADL's are improving  as well, due to having less difficulty at home. OT recommending pt continue OT for 1-2x per week for 4 more weeks, with a focus on improving overall strength to 4+/5 in all motions. Verbal and tactile cuing provided for positioning and technique throughout all exercises.    PERFORMANCE DEFICITS: in functional skills including ADLs, IADLs, ROM, strength, pain, fascial restrictions, Gross motor control, body mechanics, and UE functional use.   PLAN:  OT FREQUENCY: 1-2x/week  OT DURATION: 4 weeks  PLANNED INTERVENTIONS: self care/ADL training, therapeutic exercise, therapeutic activity, manual therapy, passive range of motion, functional mobility training, electrical stimulation, ultrasound, moist heat, patient/family education, energy conservation, coping strategies training, and DME and/or AE instructions  RECOMMENDED OTHER SERVICES: N/A  CONSULTED AND AGREED WITH PLAN OF CARE: Patient  PLAN FOR NEXT SESSION: Manual Therapy, AA/ROM, Wall Slides, Bicep Tendonitis stretches, start A/ROM and scapular Strengthening, start shoulder strengthening and  UBE   Trish Mage, OTR/L [facility] [phone] Soleia Badolato Rosemarie Beath, OT 06/20/2023, 12:18 PM

## 2023-06-20 NOTE — Patient Instructions (Signed)

## 2023-06-24 ENCOUNTER — Encounter (HOSPITAL_COMMUNITY): Payer: Self-pay | Admitting: Occupational Therapy

## 2023-06-24 ENCOUNTER — Ambulatory Visit (HOSPITAL_COMMUNITY): Payer: Medicare PPO | Admitting: Occupational Therapy

## 2023-06-24 DIAGNOSIS — M25611 Stiffness of right shoulder, not elsewhere classified: Secondary | ICD-10-CM | POA: Diagnosis not present

## 2023-06-24 DIAGNOSIS — R29898 Other symptoms and signs involving the musculoskeletal system: Secondary | ICD-10-CM

## 2023-06-24 DIAGNOSIS — M79621 Pain in right upper arm: Secondary | ICD-10-CM | POA: Diagnosis not present

## 2023-06-24 NOTE — Therapy (Signed)
OUTPATIENT OCCUPATIONAL THERAPY ORTHO TREATMENT NOTE  Patient Name: Amanda Mathews MRN: [id number] DOB:Jul 30, 1946, 78 y.o., female Today's Date: 06/24/2023  PCP: Ardith Dark, MD REFERRING PROVIDER: Ardith Dark, MD  END OF SESSION:  OT End of Session - 06/24/23 1214     Visit Number 6    Number of Visits 13    Date for OT Re-Evaluation 07/18/23    Authorization Type [employer]    Authorization Time Period 8 visits approved (05/20/23-06/20/23), requesting 8 more visits    Authorization - Visit Number 1    Authorization - Number of Visits 8    OT Start Time 1033    OT Stop Time 1111    OT Time Calculation (min) 38 min    Activity Tolerance Patient tolerated treatment well    Behavior During Therapy WFL for tasks assessed/performed              Past Medical History:  Diagnosis Date   Cataract    Osteoporosis    Sleep apnea    Thyroid disease    Past Surgical History:  Procedure Laterality Date   back stimulator      BUNIONECTOMY     CHOLECYSTECTOMY     FEMUR FRACTURE SURGERY     REPLACEMENT TOTAL KNEE BILATERAL Bilateral    Patient Active Problem List   Diagnosis Date Noted   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   Spinal stenosis of lumbar region 04/27/2023   Trigeminal neuralgia 04/27/2023   Leg edema 04/17/2023   Dystonia 04/29/2022   Hypokalemia 01/23/2021   Chronic low back pain 07/17/2020   Essential hypertension 07/17/2020   Hypothyroidism 07/17/2020   Dyslipidemia 07/17/2020   Osteoporosis 07/17/2020   Depression, major, single episode, 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   Osteoarthritis 07/17/2020   OSA on CPAP 07/17/2020    ONSET DATE: 05/01/23  REFERRING DIAG: RUE Pain  THERAPY DIAG:  Pain in right upper arm  Shoulder stiffness, right  Other symptoms and signs involving the musculoskeletal system  Rationale for Evaluation and Treatment: Rehabilitation  SUBJECTIVE:   SUBJECTIVE STATEMENT: "I'm feeling anxious and shaky today." Pt  accompanied by: self  PERTINENT HISTORY: Pt has had multiple falls in the last 2 months, the last one resulting in a face laceration and RUE pain. Additionally her PMH significant for Osteoporosis, Spinal Stimulator, and HTN.  PRECAUTIONS: None  WEIGHT BEARING RESTRICTIONS: No  PAIN:  Are you having pain? Yes: NPRS scale: 2/10 Pain location: Bicep Region Pain description: sore Aggravating factors: movement Relieving factors: Tylenol  FALLS: Has patient fallen in last 6 months? Yes. Number of falls 2  LIVING ENVIRONMENT: Lives with: lives alone Lives in: House/apartment  PLOF: Independent with household mobility with device  PATIENT GOALS: To reduce pain  NEXT MD VISIT: 05/22/23  OBJECTIVE:   HAND DOMINANCE: Right  ADLs: Overall ADLs: Pt has difficulties with donning her bra and reaching back to wash/dry her back. Unable to lift items for cooking and cleaning.   FUNCTIONAL OUTCOME MEASURES: FOTO: 54.38 06/20/23: 68.89  UPPER EXTREMITY ROM:     Active ROM Right eval Right 06/20/23  Shoulder flexion 144 151  Shoulder abduction 138 169  Shoulder internal rotation 90 90  Shoulder external rotation 50 78  (Blank rows = not tested)   UPPER EXTREMITY MMT:     MMT Right eval Right 06/20/23  Shoulder flexion 4/5 4+/5  Shoulder abduction 3+/5 4-/5  Shoulder adduction 4/5 5/5  Shoulder extension 4/5 4+/5  Shoulder internal rotation 3+/5  4/5  Shoulder external rotation 3+/5 4/5  Elbow flexion 4+/5 4+/5  Elbow extension 5/5 5/5  (Blank rows = not tested)  SENSATION: Intermittent numbness or tingling in R hand  EDEMA: No swelling noted  OBSERVATIONS: Moderate fascial restrictions along the biceps, flexors and extensors of forearm, and upper trapezius.    TODAY'S TREATMENT:                                                                                                                              DATE:  06/24/23 -Manual Therapy: myofascial release and trigger point  applied to the biceps, deltoid, anterior shoulder girdle, and trapezius in order to reduce fascial restrictions and pain to improve ROM. -A/ROM: seated, 1lb dumbbells, flexion, abduction, protraction, horizontal abduction, er/IR, x12 -proximal shoulder exercises: 1lb dumbbells, paddles, criss cross, circles both direction, x10 each -scapular strengthening: red band, extension, retraction, rows, protraction, x10 -Shoulder strengthening: red band, horizontal abduction, er, IR, flexion, abduction, x10  06/20/23 -Manual Therapy: myofascial release and trigger point applied to the biceps, deltoid, anterior shoulder girdle, and trapezius in order to reduce fascial restrictions and pain to improve ROM. -A/ROM: supine, flexion, abduction, protraction, horizontal abduction, er/IR, x15 -Scapular Strengthening: red band, extension, retraction, rows, x15 -Theraband exercises: red band, tricep presses, bicep curls, x15  06/17/23 -Manual Therapy: myofascial release and trigger point applied to the biceps, deltoid, anterior shoulder girdle, and trapezius in order to reduce fascial restrictions and pain to improve ROM. -A/ROM: supine, flexion, abduction, protraction, horizontal abduction, er/IR, x15 -Shoulder strengthening, seated, 1lb dumbbells, flexion, abduction, protraction, horizontal abduction, er/IR, x10 -Scapular Strengthening: yellow band, extension, retraction, rows, x10 -Theraband exercises: yellow band, tricep presses, bicep curls, x10   PATIENT EDUCATION: Education details: Shoulder strengthening (red) Person educated: Patient Education method: Explanation Education comprehension: verbalized understanding  HOME EXERCISE PROGRAM: 6/21: AA/ROM and bicep stretches 6/28: A/ROM 7/5: Scap strengthening (red) 7/9: Shoulder strengthening (red)  GOALS: Goals reviewed with patient? Yes  SHORT TERM GOALS: Target date: 06/20/23  Pt will be provided and educated on HEP for RUE mobility during ADL  completion.  Goal status: IN PROGRESS  2.  Pt will decrease pain to 2/10 in RUE in order to sleep for 2+ consecutive hours without waking due to pain.   Goal status: IN PROGRESS  3.  Pt will decrease fascial restrictions in RUE to minimal amounts or less in order to complete reaching tasks.  Goal status: IN PROGRESS  4.  Pt will increase RUE ROM to [facility] in order to complete overhead and behind the back reaching during dressing and bathing.   Goal status: IN PROGRESS  5.  Pt will improve RUE to 4+/5 in order to complete lifting tasks during cooking and cleaning.   Goal status: IN PROGRESS   ASSESSMENT:  CLINICAL IMPRESSION: Pt continuing to demonstrate improvements with her ROM and strength. She was able to start shoulder strengthening with minimal pain this session. She did require increased rest  breaks, however this was due to her feeling shaky and her legs feeling weak. Pt is continuing to tolerate the red band well and was able to complete her ROM warm up with 1lb dumbbells this session in sitting. OT providing verbal and visual cuing for positioning and technique throughout session.  PERFORMANCE DEFICITS: in functional skills including ADLs, IADLs, ROM, strength, pain, fascial restrictions, Gross motor control, body mechanics, and UE functional use.   PLAN:  OT FREQUENCY: 1-2x/week  OT DURATION: 4 weeks  PLANNED INTERVENTIONS: self care/ADL training, therapeutic exercise, therapeutic activity, manual therapy, passive range of motion, functional mobility training, electrical stimulation, ultrasound, moist heat, patient/family education, energy conservation, coping strategies training, and DME and/or AE instructions  RECOMMENDED OTHER SERVICES: N/A  CONSULTED AND AGREED WITH PLAN OF CARE: Patient  PLAN FOR NEXT SESSION: Manual Therapy, AA/ROM, Wall Slides, Bicep Tendonitis stretches, start A/ROM and scapular Strengthening, start shoulder strengthening and UBE   Trish Mage, OTR/L [facility] [phone] Kuper Rennels Rosemarie Beath, OT 06/24/2023, 12:16 PM

## 2023-06-24 NOTE — Patient Instructions (Signed)

## 2023-06-26 ENCOUNTER — Other Ambulatory Visit: Payer: Self-pay | Admitting: Family Medicine

## 2023-06-26 ENCOUNTER — Other Ambulatory Visit: Payer: Self-pay | Admitting: Family

## 2023-06-26 DIAGNOSIS — R0982 Postnasal drip: Secondary | ICD-10-CM

## 2023-06-27 ENCOUNTER — Ambulatory Visit (HOSPITAL_COMMUNITY): Payer: Medicare PPO | Admitting: Occupational Therapy

## 2023-06-27 ENCOUNTER — Encounter (HOSPITAL_COMMUNITY): Payer: Self-pay | Admitting: Occupational Therapy

## 2023-06-27 DIAGNOSIS — R29898 Other symptoms and signs involving the musculoskeletal system: Secondary | ICD-10-CM

## 2023-06-27 DIAGNOSIS — M25611 Stiffness of right shoulder, not elsewhere classified: Secondary | ICD-10-CM | POA: Diagnosis not present

## 2023-06-27 DIAGNOSIS — M79621 Pain in right upper arm: Secondary | ICD-10-CM

## 2023-06-27 NOTE — Therapy (Signed)
OUTPATIENT OCCUPATIONAL THERAPY ORTHO TREATMENT NOTE  Patient Name: Amanda Mathews MRN: [id number] DOB:1946-03-17, 77 y.o., female Today's Date: 06/27/2023  PCP: Ardith Dark, MD REFERRING PROVIDER: Ardith Dark, MD  END OF SESSION:  OT End of Session - 06/27/23 1142     Visit Number 7    Number of Visits 13    Date for OT Re-Evaluation 07/18/23    Authorization Type [employer]    Authorization Time Period 8visits approved (06/24/23-07/18/23)    Authorization - Visit Number 1    Authorization - Number of Visits 8    OT Start Time 1034    OT Stop Time 1116    OT Time Calculation (min) 42 min    Activity Tolerance Patient tolerated treatment well    Behavior During Therapy WFL for tasks assessed/performed            Past Medical History:  Diagnosis Date   Cataract    Osteoporosis    Sleep apnea    Thyroid disease    Past Surgical History:  Procedure Laterality Date   back stimulator      BUNIONECTOMY     CHOLECYSTECTOMY     FEMUR FRACTURE SURGERY     REPLACEMENT TOTAL KNEE BILATERAL Bilateral    Patient Active Problem List   Diagnosis Date Noted   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   Spinal stenosis of lumbar region 04/27/2023   Trigeminal neuralgia 04/27/2023   Leg edema 04/17/2023   Dystonia 04/29/2022   Hypokalemia 01/23/2021   Chronic low back pain 07/17/2020   Essential hypertension 07/17/2020   Hypothyroidism 07/17/2020   Dyslipidemia 07/17/2020   Osteoporosis 07/17/2020   Depression, major, single episode, 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   Osteoarthritis 07/17/2020   OSA on CPAP 07/17/2020    ONSET DATE: 05/01/23  REFERRING DIAG: RUE Pain  THERAPY DIAG:  Pain in right upper arm  Shoulder stiffness, right  Other symptoms and signs involving the musculoskeletal system  Rationale for Evaluation and Treatment: Rehabilitation  SUBJECTIVE:   SUBJECTIVE STATEMENT: "There has been so much going on." Pt accompanied by: self  PERTINENT  HISTORY: Pt has had multiple falls in the last 2 months, the last one resulting in a face laceration and RUE pain. Additionally her PMH significant for Osteoporosis, Spinal Stimulator, and HTN.  PRECAUTIONS: None  WEIGHT BEARING RESTRICTIONS: No  PAIN:  Are you having pain? Yes: NPRS scale: 1/10 Pain location: Bicep Region Pain description: sore Aggravating factors: movement Relieving factors: Tylenol  FALLS: Has patient fallen in last 6 months? Yes. Number of falls 2  LIVING ENVIRONMENT: Lives with: lives alone Lives in: House/apartment  PLOF: Independent with household mobility with device  PATIENT GOALS: To reduce pain  NEXT MD VISIT: 05/22/23  OBJECTIVE:   HAND DOMINANCE: Right  ADLs: Overall ADLs: Pt has difficulties with donning her bra and reaching back to wash/dry her back. Unable to lift items for cooking and cleaning.   FUNCTIONAL OUTCOME MEASURES: FOTO: 54.38 06/20/23: 68.89  UPPER EXTREMITY ROM:     Active ROM Right eval Right 06/20/23  Shoulder flexion 144 151  Shoulder abduction 138 169  Shoulder internal rotation 90 90  Shoulder external rotation 50 78  (Blank rows = not tested)   UPPER EXTREMITY MMT:     MMT Right eval Right 06/20/23  Shoulder flexion 4/5 4+/5  Shoulder abduction 3+/5 4-/5  Shoulder adduction 4/5 5/5  Shoulder extension 4/5 4+/5  Shoulder internal rotation 3+/5 4/5  Shoulder external rotation 3+/5  4/5  Elbow flexion 4+/5 4+/5  Elbow extension 5/5 5/5  (Blank rows = not tested)  SENSATION: Intermittent numbness or tingling in R hand  EDEMA: No swelling noted  OBSERVATIONS: Moderate fascial restrictions along the biceps, flexors and extensors of forearm, and upper trapezius.    TODAY'S TREATMENT:                                                                                                                              DATE:  06/27/23 -Manual Therapy: myofascial release and trigger point applied to the biceps, deltoid,  anterior shoulder girdle, and trapezius in order to reduce fascial restrictions and pain to improve ROM. -A/ROM: seated, 1lb dumbbells, flexion, abduction, protraction, horizontal abduction, er/IR, x15 -ABC's in the air with 1lb dumbbell -PNF strengthening: red band, chest pulls, er pulls, PNF up, PNF down, x12  06/24/23 -Manual Therapy: myofascial release and trigger point applied to the biceps, deltoid, anterior shoulder girdle, and trapezius in order to reduce fascial restrictions and pain to improve ROM. -A/ROM: seated, 1lb dumbbells, flexion, abduction, protraction, horizontal abduction, er/IR, x12 -proximal shoulder exercises: 1lb dumbbells, paddles, criss cross, circles both direction, x10 each -scapular strengthening: red band, extension, retraction, rows, protraction, x10 -Shoulder strengthening: red band, horizontal abduction, er, IR, flexion, abduction, x10  06/20/23 -Manual Therapy: myofascial release and trigger point applied to the biceps, deltoid, anterior shoulder girdle, and trapezius in order to reduce fascial restrictions and pain to improve ROM. -A/ROM: supine, flexion, abduction, protraction, horizontal abduction, er/IR, x15 -Scapular Strengthening: red band, extension, retraction, rows, x15 -Theraband exercises: red band, tricep presses, bicep curls, x15   PATIENT EDUCATION: Education details: PNF Strengthening Person educated: Patient Education method: Explanation Education comprehension: verbalized understanding  HOME EXERCISE PROGRAM: 6/21: AA/ROM and bicep stretches 6/28: A/ROM 7/5: Scap strengthening (red) 7/9: Shoulder strengthening (red) 7/12: PNF Strengthening  GOALS: Goals reviewed with patient? Yes  SHORT TERM GOALS: Target date: 06/20/23  Pt will be provided and educated on HEP for RUE mobility during ADL completion.  Goal status: IN PROGRESS  2.  Pt will decrease pain to 2/10 in RUE in order to sleep for 2+ consecutive hours without waking due to  pain.   Goal status: IN PROGRESS  3.  Pt will decrease fascial restrictions in RUE to minimal amounts or less in order to complete reaching tasks.  Goal status: IN PROGRESS  4.  Pt will increase RUE ROM to [facility] in order to complete overhead and behind the back reaching during dressing and bathing.   Goal status: IN PROGRESS  5.  Pt will improve RUE to 4+/5 in order to complete lifting tasks during cooking and cleaning.   Goal status: IN PROGRESS   ASSESSMENT:  CLINICAL IMPRESSION: This session continuing to demonstrate improvements with her ROM and strength. Pt had no pain this session with minimal fascial restrictions along the biceps. OT adding PNF strengthening this session, which she demonstrated good movement pattern with and  reported moderate difficulties with the red resistance band. OT providing verbal and visual cuing for positioning and technique throughout session.  PERFORMANCE DEFICITS: in functional skills including ADLs, IADLs, ROM, strength, pain, fascial restrictions, Gross motor control, body mechanics, and UE functional use.   PLAN:  OT FREQUENCY: 1-2x/week  OT DURATION: 4 weeks  PLANNED INTERVENTIONS: self care/ADL training, therapeutic exercise, therapeutic activity, manual therapy, passive range of motion, functional mobility training, electrical stimulation, ultrasound, moist heat, patient/family education, energy conservation, coping strategies training, and DME and/or AE instructions  RECOMMENDED OTHER SERVICES: N/A  CONSULTED AND AGREED WITH PLAN OF CARE: Patient  PLAN FOR NEXT SESSION: Manual Therapy, AA/ROM, Wall Slides, Bicep Tendonitis stretches, start A/ROM and scapular Strengthening, start shoulder strengthening and UBE   Trish Mage, OTR/L [facility] [phone] Jairo Bellew Rosemarie Beath, OT 06/27/2023, 11:43 AM

## 2023-06-27 NOTE — Patient Instructions (Signed)

## 2023-07-01 ENCOUNTER — Ambulatory Visit (HOSPITAL_COMMUNITY): Payer: Medicare PPO | Admitting: Occupational Therapy

## 2023-07-01 ENCOUNTER — Encounter (HOSPITAL_COMMUNITY): Payer: Self-pay | Admitting: Occupational Therapy

## 2023-07-01 DIAGNOSIS — M25611 Stiffness of right shoulder, not elsewhere classified: Secondary | ICD-10-CM | POA: Diagnosis not present

## 2023-07-01 DIAGNOSIS — R29898 Other symptoms and signs involving the musculoskeletal system: Secondary | ICD-10-CM

## 2023-07-01 DIAGNOSIS — M79621 Pain in right upper arm: Secondary | ICD-10-CM | POA: Diagnosis not present

## 2023-07-01 NOTE — Therapy (Signed)
OUTPATIENT OCCUPATIONAL THERAPY ORTHO TREATMENT NOTE  Patient Name: Amanda Mathews MRN: [id number] DOB:09-Aug-1946, 77 y.o., female Today's Date: 07/01/2023  PCP: Ardith Dark, MD REFERRING PROVIDER: Ardith Dark, MD  END OF SESSION:  OT End of Session - 07/01/23 1906     Visit Number 8    Number of Visits 13    Date for OT Re-Evaluation 07/18/23    Authorization Type [employer]    Authorization Time Period 8visits approved (06/24/23-07/18/23)    Authorization - Visit Number 2    Authorization - Number of Visits 8    OT Start Time 1039    OT Stop Time 1120    OT Time Calculation (min) 41 min    Activity Tolerance Patient tolerated treatment well    Behavior During Therapy WFL for tasks assessed/performed             Past Medical History:  Diagnosis Date   Cataract    Osteoporosis    Sleep apnea    Thyroid disease    Past Surgical History:  Procedure Laterality Date   back stimulator      BUNIONECTOMY     CHOLECYSTECTOMY     FEMUR FRACTURE SURGERY     REPLACEMENT TOTAL KNEE BILATERAL Bilateral    Patient Active Problem List   Diagnosis Date Noted   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   Spinal stenosis of lumbar region 04/27/2023   Trigeminal neuralgia 04/27/2023   Leg edema 04/17/2023   Dystonia 04/29/2022   Hypokalemia 01/23/2021   Chronic low back pain 07/17/2020   Essential hypertension 07/17/2020   Hypothyroidism 07/17/2020   Dyslipidemia 07/17/2020   Osteoporosis 07/17/2020   Depression, major, single episode, 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   Osteoarthritis 07/17/2020   OSA on CPAP 07/17/2020    ONSET DATE: 05/01/23  REFERRING DIAG: RUE Pain  THERAPY DIAG:  Pain in right upper arm  Shoulder stiffness, right  Other symptoms and signs involving the musculoskeletal system  Rationale for Evaluation and Treatment: Rehabilitation  SUBJECTIVE:   SUBJECTIVE STATEMENT: "I'm just sore" Pt accompanied by: self  PERTINENT HISTORY: Pt has  had multiple falls in the last 2 months, the last one resulting in a face laceration and RUE pain. Additionally her PMH significant for Osteoporosis, Spinal Stimulator, and HTN.  PRECAUTIONS: None  WEIGHT BEARING RESTRICTIONS: No  PAIN:  Are you having pain? Yes: NPRS scale: 3/10 Pain location: Bicep Region Pain description: sore Aggravating factors: movement Relieving factors: Tylenol  FALLS: Has patient fallen in last 6 months? Yes. Number of falls 2  LIVING ENVIRONMENT: Lives with: lives alone Lives in: House/apartment  PLOF: Independent with household mobility with device  PATIENT GOALS: To reduce pain  NEXT MD VISIT: 05/22/23  OBJECTIVE:   HAND DOMINANCE: Right  ADLs: Overall ADLs: Pt has difficulties with donning her bra and reaching back to wash/dry her back. Unable to lift items for cooking and cleaning.   FUNCTIONAL OUTCOME MEASURES: FOTO: 54.38 06/20/23: 68.89  UPPER EXTREMITY ROM:     Active ROM Right eval Right 06/20/23  Shoulder flexion 144 151  Shoulder abduction 138 169  Shoulder internal rotation 90 90  Shoulder external rotation 50 78  (Blank rows = not tested)   UPPER EXTREMITY MMT:     MMT Right eval Right 06/20/23  Shoulder flexion 4/5 4+/5  Shoulder abduction 3+/5 4-/5  Shoulder adduction 4/5 5/5  Shoulder extension 4/5 4+/5  Shoulder internal rotation 3+/5 4/5  Shoulder external rotation 3+/5 4/5  Elbow  flexion 4+/5 4+/5  Elbow extension 5/5 5/5  (Blank rows = not tested)  SENSATION: Intermittent numbness or tingling in R hand  EDEMA: No swelling noted  OBSERVATIONS: Moderate fascial restrictions along the biceps, flexors and extensors of forearm, and upper trapezius.    TODAY'S TREATMENT:                                                                                                                              DATE:  07/01/23 -Manual Therapy: myofascial release and trigger point applied to the biceps, deltoid, anterior shoulder  girdle, and trapezius in order to reduce fascial restrictions and pain to improve ROM. -A/ROM: seated, 2lb dumbbells, flexion, abduction, protraction, horizontal abduction, er/IR, x10 -proximal shoulder exercises: 1lb dumbbells, paddles, criss cross, circles both direction, x12 each -PNF strengthening: green band, chest pulls, er pulls, PNF up, PNF down, x12 -Theraband exercises: green band, tricep presses, bicep curls, x15  06/27/23 -Manual Therapy: myofascial release and trigger point applied to the biceps, deltoid, anterior shoulder girdle, and trapezius in order to reduce fascial restrictions and pain to improve ROM. -A/ROM: seated, 1lb dumbbells, flexion, abduction, protraction, horizontal abduction, er/IR, x15 -ABC's in the air with 1lb dumbbell -PNF strengthening: red band, chest pulls, er pulls, PNF up, PNF down, x12  06/24/23 -Manual Therapy: myofascial release and trigger point applied to the biceps, deltoid, anterior shoulder girdle, and trapezius in order to reduce fascial restrictions and pain to improve ROM. -A/ROM: seated, 1lb dumbbells, flexion, abduction, protraction, horizontal abduction, er/IR, x12 -proximal shoulder exercises: 1lb dumbbells, paddles, criss cross, circles both direction, x10 each -scapular strengthening: red band, extension, retraction, rows, protraction, x10 -Shoulder strengthening: red band, horizontal abduction, er, IR, flexion, abduction, x10   PATIENT EDUCATION: Education details: Continue HEP Person educated: Patient Education method: Explanation Education comprehension: verbalized understanding  HOME EXERCISE PROGRAM: 6/21: AA/ROM and bicep stretches 6/28: A/ROM 7/5: Scap strengthening (red) 7/9: Shoulder strengthening (red) 7/12: PNF Strengthening  GOALS: Goals reviewed with patient? Yes  SHORT TERM GOALS: Target date: 06/20/23  Pt will be provided and educated on HEP for RUE mobility during ADL completion.  Goal status: IN PROGRESS  2.   Pt will decrease pain to 2/10 in RUE in order to sleep for 2+ consecutive hours without waking due to pain.   Goal status: IN PROGRESS  3.  Pt will decrease fascial restrictions in RUE to minimal amounts or less in order to complete reaching tasks.  Goal status: IN PROGRESS  4.  Pt will increase RUE ROM to [facility] in order to complete overhead and behind the back reaching during dressing and bathing.   Goal status: IN PROGRESS  5.  Pt will improve RUE to 4+/5 in order to complete lifting tasks during cooking and cleaning.   Goal status: IN PROGRESS   ASSESSMENT:  CLINICAL IMPRESSION: This session pt continuing to work on overall strengthening and taking the load off of her biceps. She is tolerating increasing  the repetitions and resistance well, however required increased rest breaks due to muscle fatigue. OT providing verbal and visual cuing for positioning and technique as needed throughout session.   PERFORMANCE DEFICITS: in functional skills including ADLs, IADLs, ROM, strength, pain, fascial restrictions, Gross motor control, body mechanics, and UE functional use.   PLAN:  OT FREQUENCY: 1-2x/week  OT DURATION: 4 weeks  PLANNED INTERVENTIONS: self care/ADL training, therapeutic exercise, therapeutic activity, manual therapy, passive range of motion, functional mobility training, electrical stimulation, ultrasound, moist heat, patient/family education, energy conservation, coping strategies training, and DME and/or AE instructions  RECOMMENDED OTHER SERVICES: N/A  CONSULTED AND AGREED WITH PLAN OF CARE: Patient  PLAN FOR NEXT SESSION: Manual Therapy, AA/ROM, Wall Slides, Bicep Tendonitis stretches, start A/ROM and scapular Strengthening, start shoulder strengthening and UBE   Trish Mage, OTR/L [facility] [phone] Yotam Rhine Rosemarie Beath, OT 07/01/2023, 7:08 PM

## 2023-07-05 ENCOUNTER — Other Ambulatory Visit: Payer: Self-pay | Admitting: Family Medicine

## 2023-07-11 ENCOUNTER — Ambulatory Visit (HOSPITAL_COMMUNITY): Payer: Medicare PPO | Admitting: Occupational Therapy

## 2023-07-11 ENCOUNTER — Encounter (HOSPITAL_COMMUNITY): Payer: Self-pay | Admitting: Occupational Therapy

## 2023-07-11 DIAGNOSIS — R29898 Other symptoms and signs involving the musculoskeletal system: Secondary | ICD-10-CM | POA: Diagnosis not present

## 2023-07-11 DIAGNOSIS — M79621 Pain in right upper arm: Secondary | ICD-10-CM

## 2023-07-11 DIAGNOSIS — M25611 Stiffness of right shoulder, not elsewhere classified: Secondary | ICD-10-CM

## 2023-07-11 NOTE — Therapy (Unsigned)
OUTPATIENT OCCUPATIONAL THERAPY ORTHO TREATMENT NOTE  Patient Name: Amanda Mathews MRN: [id number] DOB:[birth year], 77 y.o., female Today's Date: 07/12/2023  PCP: Ardith Dark, MD REFERRING PROVIDER: Ardith Dark, MD  END OF SESSION:  OT End of Session - 07/12/23 2214     Visit Number 9    Number of Visits 13    Date for OT Re-Evaluation 07/18/23    Authorization Type [employer]    Authorization Time Period 8visits approved (06/24/23-07/18/23)    Authorization - Visit Number 3    Authorization - Number of Visits 8    OT Start Time [phone]    OT Stop Time 1034    OT Time Calculation (min) 43 min    Activity Tolerance Patient tolerated treatment well    Behavior During Therapy [facility] for tasks assessed/performed             Past Medical History:  Diagnosis Date   Cataract    Osteoporosis    Sleep apnea    Thyroid disease    Past Surgical History:  Procedure Laterality Date   back stimulator      BUNIONECTOMY     CHOLECYSTECTOMY     FEMUR FRACTURE SURGERY     REPLACEMENT TOTAL KNEE BILATERAL Bilateral    Patient Active Problem List   Diagnosis Date Noted   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   Spinal stenosis of lumbar region 04/27/2023   Trigeminal neuralgia 04/27/2023   Leg edema 04/17/2023   Dystonia 04/29/2022   Hypokalemia 01/23/2021   Chronic low back pain 07/17/2020   Essential hypertension 07/17/2020   Hypothyroidism 07/17/2020   Dyslipidemia 07/17/2020   Osteoporosis 07/17/2020   Depression, major, single episode, 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   Osteoarthritis 07/17/2020   OSA on CPAP 07/17/2020    ONSET DATE: 05/01/23  REFERRING DIAG: RUE Pain  THERAPY DIAG:  Pain in right upper arm  Shoulder stiffness, right  Other symptoms and signs involving the musculoskeletal system  Rationale for Evaluation and Treatment: Rehabilitation  SUBJECTIVE:   SUBJECTIVE STATEMENT: "I'm just sore" Pt accompanied by: self  PERTINENT HISTORY: Pt has  had multiple falls in the last 2 months, the last one resulting in a face laceration and RUE pain. Additionally her PMH significant for Osteoporosis, Spinal Stimulator, and HTN.  PRECAUTIONS: None  WEIGHT BEARING RESTRICTIONS: No  PAIN:  Are you having pain? Yes: NPRS scale: 3/10 Pain location: Bicep Region Pain description: sore Aggravating factors: movement Relieving factors: Tylenol  FALLS: Has patient fallen in last 6 months? Yes. Number of falls 2  LIVING ENVIRONMENT: Lives with: lives alone Lives in: House/apartment  PLOF: Independent with household mobility with device  PATIENT GOALS: To reduce pain  NEXT MD VISIT: 05/22/23  OBJECTIVE:   HAND DOMINANCE: Right  ADLs: Overall ADLs: Pt has difficulties with donning her bra and reaching back to wash/dry her back. Unable to lift items for cooking and cleaning.   FUNCTIONAL OUTCOME MEASURES: FOTO: 54.38 06/20/23: 68.89  UPPER EXTREMITY ROM:     Active ROM Right eval Right 06/20/23  Shoulder flexion 144 151  Shoulder abduction 138 169  Shoulder internal rotation 90 90  Shoulder external rotation 50 78  (Blank rows = not tested)   UPPER EXTREMITY MMT:     MMT Right eval Right 06/20/23  Shoulder flexion 4/5 4+/5  Shoulder abduction 3+/5 4-/5  Shoulder adduction 4/5 5/5  Shoulder extension 4/5 4+/5  Shoulder internal rotation 3+/5 4/5  Shoulder external rotation 3+/5 4/5  Elbow  flexion 4+/5 4+/5  Elbow extension 5/5 5/5  (Blank rows = not tested)  SENSATION: Intermittent numbness or tingling in R hand  EDEMA: No swelling noted  OBSERVATIONS: Moderate fascial restrictions along the biceps, flexors and extensors of forearm, and upper trapezius.    TODAY'S TREATMENT:                                                                                                                              DATE:  07/11/23 -Manual Therapy: myofascial release and trigger point applied to the biceps, deltoid, anterior shoulder  girdle, and trapezius in order to reduce fascial restrictions and pain to improve ROM. -A/ROM: seated, flexion, abduction, protraction, horizontal abduction, er/IR, x15 -shoulder strengthening: 2lb dumbbell, flexion, abduction, protraction, horizontal abduction, er/IR, x12 -Therapy Ball Exercises: flexion, protraction, overhead press, circles both directions, V ups, x12  07/01/23 -Manual Therapy: myofascial release and trigger point applied to the biceps, deltoid, anterior shoulder girdle, and trapezius in order to reduce fascial restrictions and pain to improve ROM. -A/ROM: seated, 2lb dumbbells, flexion, abduction, protraction, horizontal abduction, er/IR, x10 -proximal shoulder exercises: 1lb dumbbells, paddles, criss cross, circles both direction, x12 each -PNF strengthening: green band, chest pulls, er pulls, PNF up, PNF down, x12 -Theraband exercises: green band, tricep presses, bicep curls, x15  06/27/23 -Manual Therapy: myofascial release and trigger point applied to the biceps, deltoid, anterior shoulder girdle, and trapezius in order to reduce fascial restrictions and pain to improve ROM. -A/ROM: seated, 1lb dumbbells, flexion, abduction, protraction, horizontal abduction, er/IR, x15 -ABC's in the air with 1lb dumbbell -PNF strengthening: red band, chest pulls, er pulls, PNF up, PNF down, x12   PATIENT EDUCATION: Education details: Continue HEP Person educated: Patient Education method: Explanation Education comprehension: verbalized understanding  HOME EXERCISE PROGRAM: 6/21: AA/ROM and bicep stretches 6/28: A/ROM 7/5: Scap strengthening (red) 7/9: Shoulder strengthening (red) 7/12: PNF Strengthening  GOALS: Goals reviewed with patient? Yes  SHORT TERM GOALS: Target date: 06/20/23  Pt will be provided and educated on HEP for RUE mobility during ADL completion.  Goal status: IN PROGRESS  2.  Pt will decrease pain to 2/10 in RUE in order to sleep for 2+ consecutive hours  without waking due to pain.   Goal status: IN PROGRESS  3.  Pt will decrease fascial restrictions in RUE to minimal amounts or less in order to complete reaching tasks.  Goal status: IN PROGRESS  4.  Pt will increase RUE ROM to [facility] in order to complete overhead and behind the back reaching during dressing and bathing.   Goal status: IN PROGRESS  5.  Pt will improve RUE to 4+/5 in order to complete lifting tasks during cooking and cleaning.   Goal status: IN PROGRESS   ASSESSMENT:  CLINICAL IMPRESSION: Pt is improving with discomfort and mobility. She is able to achieve 85% of full ROM and tolerate 2lb dumbbells with minimal to moderate difficulty. Pt does demonstrate mild fatigue with increased  exercises and motions, however with intermittent short rest breaks she is able to continue each task. OT providing verbal and visual cuing for positioning and technique.   PERFORMANCE DEFICITS: in functional skills including ADLs, IADLs, ROM, strength, pain, fascial restrictions, Gross motor control, body mechanics, and UE functional use.   PLAN:  OT FREQUENCY: 1-2x/week  OT DURATION: 4 weeks  PLANNED INTERVENTIONS: self care/ADL training, therapeutic exercise, therapeutic activity, manual therapy, passive range of motion, functional mobility training, electrical stimulation, ultrasound, moist heat, patient/family education, energy conservation, coping strategies training, and DME and/or AE instructions  RECOMMENDED OTHER SERVICES: N/A  CONSULTED AND AGREED WITH PLAN OF CARE: Patient  PLAN FOR NEXT SESSION: Manual Therapy, AA/ROM, Wall Slides, Bicep Tendonitis stretches, start A/ROM and scapular Strengthening, start shoulder strengthening and UBE   Trish Mage, OTR/L [facility] [phone] Kameo Bains Rosemarie Beath, OT 07/12/2023, 10:15 PM

## 2023-07-15 ENCOUNTER — Ambulatory Visit (HOSPITAL_COMMUNITY): Payer: Medicare PPO | Admitting: Occupational Therapy

## 2023-07-15 ENCOUNTER — Encounter (HOSPITAL_COMMUNITY): Payer: Self-pay | Admitting: Occupational Therapy

## 2023-07-15 DIAGNOSIS — R29898 Other symptoms and signs involving the musculoskeletal system: Secondary | ICD-10-CM | POA: Diagnosis not present

## 2023-07-15 DIAGNOSIS — M25611 Stiffness of right shoulder, not elsewhere classified: Secondary | ICD-10-CM | POA: Diagnosis not present

## 2023-07-15 DIAGNOSIS — M79621 Pain in right upper arm: Secondary | ICD-10-CM

## 2023-07-15 NOTE — Therapy (Signed)
OUTPATIENT OCCUPATIONAL THERAPY ORTHO TREATMENT NOTE  Patient Name: Amanda Mathews MRN: [id number] DOB:[birth year], 77 y.o., female Today's Date: 07/15/2023  PCP: Ardith Dark, MD REFERRING PROVIDER: Ardith Dark, MD  END OF SESSION:  OT End of Session - 07/15/23 1120     Visit Number 10    Number of Visits 13    Date for OT Re-Evaluation 07/18/23    Authorization Type [employer]    Authorization Time Period 8visits approved (06/24/23-07/18/23)    Authorization - Visit Number 4    Authorization - Number of Visits 8    OT Start Time 1039    OT Stop Time 1119    OT Time Calculation (min) 40 min    Activity Tolerance Patient tolerated treatment well    Behavior During Therapy WFL for tasks assessed/performed              Past Medical History:  Diagnosis Date   Cataract    Osteoporosis    Sleep apnea    Thyroid disease    Past Surgical History:  Procedure Laterality Date   back stimulator      BUNIONECTOMY     CHOLECYSTECTOMY     FEMUR FRACTURE SURGERY     REPLACEMENT TOTAL KNEE BILATERAL Bilateral    Patient Active Problem List   Diagnosis Date Noted   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   Spinal stenosis of lumbar region 04/27/2023   Trigeminal neuralgia 04/27/2023   Leg edema 04/17/2023   Dystonia 04/29/2022   Hypokalemia 01/23/2021   Chronic low back pain 07/17/2020   Essential hypertension 07/17/2020   Hypothyroidism 07/17/2020   Dyslipidemia 07/17/2020   Osteoporosis 07/17/2020   Depression, major, single episode, 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   Osteoarthritis 07/17/2020   OSA on CPAP 07/17/2020    ONSET DATE: 05/01/23  REFERRING DIAG: RUE Pain  THERAPY DIAG:  Pain in right upper arm  Shoulder stiffness, right  Other symptoms and signs involving the musculoskeletal system  Rationale for Evaluation and Treatment: Rehabilitation  SUBJECTIVE:   SUBJECTIVE STATEMENT: "I'm feeling alright" Pt accompanied by: self  PERTINENT  HISTORY: Pt has had multiple falls in the last 2 months, the last one resulting in a face laceration and RUE pain. Additionally her PMH significant for Osteoporosis, Spinal Stimulator, and HTN.  PRECAUTIONS: None  WEIGHT BEARING RESTRICTIONS: No  PAIN:  Are you having pain? Yes: NPRS scale: 3/10 Pain location: Bicep Region Pain description: sore Aggravating factors: movement Relieving factors: Tylenol  FALLS: Has patient fallen in last 6 months? Yes. Number of falls 2  LIVING ENVIRONMENT: Lives with: lives alone Lives in: House/apartment  PLOF: Independent with household mobility with device  PATIENT GOALS: To reduce pain  NEXT MD VISIT: 05/22/23  OBJECTIVE:   HAND DOMINANCE: Right  ADLs: Overall ADLs: Pt has difficulties with donning her bra and reaching back to wash/dry her back. Unable to lift items for cooking and cleaning.   FUNCTIONAL OUTCOME MEASURES: FOTO: 54.38 06/20/23: 68.89  UPPER EXTREMITY ROM:     Active ROM Right eval Right 06/20/23  Shoulder flexion 144 151  Shoulder abduction 138 169  Shoulder internal rotation 90 90  Shoulder external rotation 50 78  (Blank rows = not tested)   UPPER EXTREMITY MMT:     MMT Right eval Right 06/20/23  Shoulder flexion 4/5 4+/5  Shoulder abduction 3+/5 4-/5  Shoulder adduction 4/5 5/5  Shoulder extension 4/5 4+/5  Shoulder internal rotation 3+/5 4/5  Shoulder external rotation 3+/5 4/5  Elbow flexion 4+/5 4+/5  Elbow extension 5/5 5/5  (Blank rows = not tested)  SENSATION: Intermittent numbness or tingling in R hand  EDEMA: No swelling noted  OBSERVATIONS: Moderate fascial restrictions along the biceps, flexors and extensors of forearm, and upper trapezius.    TODAY'S TREATMENT:                                                                                                                              DATE:  07/15/23 -Manual Therapy: myofascial release and trigger point applied to the biceps, deltoid,  anterior shoulder girdle, and trapezius in order to reduce fascial restrictions and pain to improve ROM. -A/ROM: seated, flexion, abduction, protraction, horizontal abduction, er/IR, x15 -X to V arms, x15 -goal post arms, x15 -scapular strengthening: green band, extension, retraction, protraction, rows, x12 -Shoulder Strengthening: green band, horizontal abduction, er, IR, flexion, abduction, x12 -Theraband exercises: green band, tricep presses, bicep curls, x15  07/11/23 -Manual Therapy: myofascial release and trigger point applied to the biceps, deltoid, anterior shoulder girdle, and trapezius in order to reduce fascial restrictions and pain to improve ROM. -A/ROM: seated, flexion, abduction, protraction, horizontal abduction, er/IR, x15 -shoulder strengthening: 2lb dumbbell, flexion, abduction, protraction, horizontal abduction, er/IR, x12 -Therapy Ball Exercises: flexion, protraction, overhead press, circles both directions, V ups, x12  07/01/23 -Manual Therapy: myofascial release and trigger point applied to the biceps, deltoid, anterior shoulder girdle, and trapezius in order to reduce fascial restrictions and pain to improve ROM. -A/ROM: seated, 2lb dumbbells, flexion, abduction, protraction, horizontal abduction, er/IR, x10 -proximal shoulder exercises: 1lb dumbbells, paddles, criss cross, circles both direction, x12 each -PNF strengthening: green band, chest pulls, er pulls, PNF up, PNF down, x12 -Theraband exercises: green band, tricep presses, bicep curls, x15    PATIENT EDUCATION: Education details: Continue HEP Person educated: Patient Education method: Explanation Education comprehension: verbalized understanding  HOME EXERCISE PROGRAM: 6/21: AA/ROM and bicep stretches 6/28: A/ROM 7/5: Scap strengthening (red) 7/9: Shoulder strengthening (red) 7/12: PNF Strengthening  GOALS: Goals reviewed with patient? Yes  SHORT TERM GOALS: Target date: 06/20/23  Pt will be  provided and educated on HEP for RUE mobility during ADL completion.  Goal status: IN PROGRESS  2.  Pt will decrease pain to 2/10 in RUE in order to sleep for 2+ consecutive hours without waking due to pain.   Goal status: IN PROGRESS  3.  Pt will decrease fascial restrictions in RUE to minimal amounts or less in order to complete reaching tasks.  Goal status: IN PROGRESS  4.  Pt will increase RUE ROM to [facility] in order to complete overhead and behind the back reaching during dressing and bathing.   Goal status: IN PROGRESS  5.  Pt will improve RUE to 4+/5 in order to complete lifting tasks during cooking and cleaning.   Goal status: IN PROGRESS   ASSESSMENT:  CLINICAL IMPRESSION: This session pt demonstrating good ROM and improving strength. She is reporting minimal pain  and able to complete all exercises with limited rest breaks. Pt only required rest breaks this session due to leg fatigue, when standing to complete theraband exercises at the end of the session. Her overall movement pattern is good with all tasks. OT providing verbal and tactile cuing for positioning and technique.   PERFORMANCE DEFICITS: in functional skills including ADLs, IADLs, ROM, strength, pain, fascial restrictions, Gross motor control, body mechanics, and UE functional use.   PLAN:  OT FREQUENCY: 1-2x/week  OT DURATION: 4 weeks  PLANNED INTERVENTIONS: self care/ADL training, therapeutic exercise, therapeutic activity, manual therapy, passive range of motion, functional mobility training, electrical stimulation, ultrasound, moist heat, patient/family education, energy conservation, coping strategies training, and DME and/or AE instructions  RECOMMENDED OTHER SERVICES: N/A  CONSULTED AND AGREED WITH PLAN OF CARE: Patient  PLAN FOR NEXT SESSION: Manual Therapy, AA/ROM, Wall Slides, Bicep Tendonitis stretches, start A/ROM and scapular Strengthening, start shoulder strengthening and UBE   Trish Mage,  OTR/L [facility] [phone] Genae Strine Rosemarie Beath, OT 07/15/2023, 11:22 AM

## 2023-07-17 ENCOUNTER — Encounter (HOSPITAL_COMMUNITY): Payer: Self-pay | Admitting: Occupational Therapy

## 2023-07-17 ENCOUNTER — Ambulatory Visit (HOSPITAL_COMMUNITY): Payer: Medicare PPO | Attending: Family Medicine | Admitting: Occupational Therapy

## 2023-07-17 DIAGNOSIS — M25611 Stiffness of right shoulder, not elsewhere classified: Secondary | ICD-10-CM | POA: Diagnosis not present

## 2023-07-17 DIAGNOSIS — M79621 Pain in right upper arm: Secondary | ICD-10-CM | POA: Diagnosis not present

## 2023-07-17 DIAGNOSIS — R29898 Other symptoms and signs involving the musculoskeletal system: Secondary | ICD-10-CM | POA: Diagnosis not present

## 2023-07-17 NOTE — Therapy (Signed)
OUTPATIENT OCCUPATIONAL THERAPY ORTHO TREATMENT NOTE  Patient Name: Amanda Mathews MRN: [id number] DOB:[birth year], 77 y.o., female Today's Date: 07/18/2023  PCP: Ardith Dark, MD REFERRING PROVIDER: Ardith Dark, MD  OCCUPATIONAL THERAPY DISCHARGE SUMMARY  Visits from Start of Care: 11  Current functional level related to goals / functional outcomes: Patient has met all OT goals. Her ROM and strength are WFL, pain and fascial restrictions are trace, and she has been provided a comprehensive HEP.    Remaining deficits: No remaining deficits noted at this time.    Education / Equipment: Pt has been provided a comprehensive HEP and theraband's for continued progression at home.    Plan: Patient agrees to discharge, as she has met all of her OT goals.      END OF SESSION:  OT End of Session - 07/17/23 1030     Visit Number 11    Number of Visits 13    Date for OT Re-Evaluation 07/18/23    Authorization Type [employer]    Authorization Time Period 8visits approved (06/24/23-07/18/23)    Authorization - Visit Number 5    Authorization - Number of Visits 8    OT Start Time 0949    OT Stop Time 1019    OT Time Calculation (min) 30 min    Activity Tolerance Patient tolerated treatment well    Behavior During Therapy WFL for tasks assessed/performed               Past Medical History:  Diagnosis Date   Cataract    Osteoporosis    Sleep apnea    Thyroid disease    Past Surgical History:  Procedure Laterality Date   back stimulator      BUNIONECTOMY     CHOLECYSTECTOMY     FEMUR FRACTURE SURGERY     REPLACEMENT TOTAL KNEE BILATERAL Bilateral    Patient Active Problem List   Diagnosis Date Noted   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   Spinal stenosis of lumbar region 04/27/2023   Trigeminal neuralgia 04/27/2023   Leg edema 04/17/2023   Dystonia 04/29/2022   Hypokalemia 01/23/2021   Chronic low back pain 07/17/2020   Essential hypertension 07/17/2020    Hypothyroidism 07/17/2020   Dyslipidemia 07/17/2020   Osteoporosis 07/17/2020   Depression, major, single episode, 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   Osteoarthritis 07/17/2020   OSA on CPAP 07/17/2020    ONSET DATE: 05/01/23  REFERRING DIAG: RUE Pain  THERAPY DIAG:  Pain in right upper arm  Shoulder stiffness, right  Other symptoms and signs involving the musculoskeletal system  Rationale for Evaluation and Treatment: Rehabilitation  SUBJECTIVE:   SUBJECTIVE STATEMENT: "I'm feeling alright" Pt accompanied by: self  PERTINENT HISTORY: Pt has had multiple falls in the last 2 months, the last one resulting in a face laceration and RUE pain. Additionally her PMH significant for Osteoporosis, Spinal Stimulator, and HTN.  PRECAUTIONS: None  WEIGHT BEARING RESTRICTIONS: No  PAIN:  Are you having pain? No  FALLS: Has patient fallen in last 6 months? Yes. Number of falls 2  LIVING ENVIRONMENT: Lives with: lives alone Lives in: House/apartment  PLOF: Independent with household mobility with device  PATIENT GOALS: To reduce pain  NEXT MD VISIT: 05/22/23  OBJECTIVE:   HAND DOMINANCE: Right  ADLs: Overall ADLs: Pt has difficulties with donning her bra and reaching back to wash/dry her back. Unable to lift items for cooking and cleaning.   FUNCTIONAL OUTCOME MEASURES: FOTO: 54.38 06/20/23: 68.89 07/17/23: 74.21  UPPER EXTREMITY ROM:     Active ROM Right eval Right 06/20/23 Right 07/17/23  Shoulder flexion 144 151 154  Shoulder abduction 138 169 169  Shoulder internal rotation 90 90 90  Shoulder external rotation 50 78 82  (Blank rows = not tested)   UPPER EXTREMITY MMT:     MMT Right eval Right 06/20/23 Right 07/17/23  Shoulder flexion 4/5 4+/5 4+/5  Shoulder abduction 3+/5 4-/5 4/5  Shoulder adduction 4/5 5/5 5/5  Shoulder extension 4/5 4+/5 5/5  Shoulder internal rotation 3+/5 4/5 4+/5  Shoulder external rotation 3+/5 4/5 5/5  Elbow flexion 4+/5 4+/5 5/5   Elbow extension 5/5 5/5 5/5  (Blank rows = not tested)  SENSATION: Intermittent numbness or tingling in R hand  EDEMA: No swelling noted  OBSERVATIONS: Moderate fascial restrictions along the biceps, flexors and extensors of forearm, and upper trapezius.    TODAY'S TREATMENT:                                                                                                                              DATE:  07/17/23 -A/ROM: seated, flexion, abduction, protraction, horizontal abduction, er/IR, x15 -X to V arms, x15 -goal post arms, x15 -Theraband exercises: green band, tricep presses, bicep curls, x15 -PNF strengthening: green band, chest pulls, er pulls, PNF up, PNF down, x12  07/15/23 -Manual Therapy: myofascial release and trigger point applied to the biceps, deltoid, anterior shoulder girdle, and trapezius in order to reduce fascial restrictions and pain to improve ROM. -A/ROM: seated, flexion, abduction, protraction, horizontal abduction, er/IR, x15 -X to V arms, x15 -goal post arms, x15 -scapular strengthening: green band, extension, retraction, protraction, rows, x12 -Shoulder Strengthening: green band, horizontal abduction, er, IR, flexion, abduction, x12 -Theraband exercises: green band, tricep presses, bicep curls, x15  07/11/23 -Manual Therapy: myofascial release and trigger point applied to the biceps, deltoid, anterior shoulder girdle, and trapezius in order to reduce fascial restrictions and pain to improve ROM. -A/ROM: seated, flexion, abduction, protraction, horizontal abduction, er/IR, x15 -shoulder strengthening: 2lb dumbbell, flexion, abduction, protraction, horizontal abduction, er/IR, x12 -Therapy Ball Exercises: flexion, protraction, overhead press, circles both directions, V ups, x12   PATIENT EDUCATION: Education details: Bicep curls and tricep pushes with band Person educated: Patient Education method: Explanation Education comprehension: verbalized  understanding  HOME EXERCISE PROGRAM: 6/21: AA/ROM and bicep stretches 6/28: A/ROM 7/5: Scap strengthening (red) 7/9: Shoulder strengthening (red) 7/12: PNF Strengthening  GOALS: Goals reviewed with patient? Yes  SHORT TERM GOALS: Target date: 06/20/23  Pt will be provided and educated on HEP for RUE mobility during ADL completion.  Goal status: MET  2.  Pt will decrease pain to 2/10 in RUE in order to sleep for 2+ consecutive hours without waking due to pain.   Goal status: MET  3.  Pt will decrease fascial restrictions in RUE to minimal amounts or less in order to complete reaching tasks.  Goal status: MET  4.  Pt  will increase RUE ROM to [facility] in order to complete overhead and behind the back reaching during dressing and bathing.   Goal status: MET  5.  Pt will improve RUE to 4+/5 in order to complete lifting tasks during cooking and cleaning.   Goal status: MET   ASSESSMENT:  CLINICAL IMPRESSION: Pt completed her Reassessment this session, where she demonstrated improved ROM and strength. She has met all of her OT goals and agrees for discharged from skilled OT. OT and pt reviewed HEP and pt demonstrated good movement pattern and follow through with all exercises. All questions and concerns were answered and pt is discharged from outpatient OT.   PERFORMANCE DEFICITS: in functional skills including ADLs, IADLs, ROM, strength, pain, fascial restrictions, Gross motor control, body mechanics, and UE functional use.   PLAN:  OT FREQUENCY: 1-2x/week  OT DURATION: 4 weeks  PLANNED INTERVENTIONS: self care/ADL training, therapeutic exercise, therapeutic activity, manual therapy, passive range of motion, functional mobility training, electrical stimulation, ultrasound, moist heat, patient/family education, energy conservation, coping strategies training, and DME and/or AE instructions  RECOMMENDED OTHER SERVICES: N/A  CONSULTED AND AGREED WITH PLAN OF CARE:  Patient  PLAN FOR NEXT SESSION: Discharge   Zakhi Dupre Bing Plume, OTR/L [facility] [phone] Tericka Devincenzi Rosemarie Beath, OT 07/18/2023, 8:44 AM

## 2023-07-25 DIAGNOSIS — G500 Trigeminal neuralgia: Secondary | ICD-10-CM | POA: Diagnosis not present

## 2023-07-28 ENCOUNTER — Other Ambulatory Visit: Payer: Self-pay | Admitting: Family Medicine

## 2023-08-04 ENCOUNTER — Other Ambulatory Visit: Payer: Self-pay | Admitting: Family Medicine

## 2023-08-06 ENCOUNTER — Other Ambulatory Visit: Payer: Self-pay | Admitting: Family Medicine

## 2023-08-07 ENCOUNTER — Other Ambulatory Visit: Payer: Self-pay | Admitting: Family Medicine

## 2023-08-11 DIAGNOSIS — M1990 Unspecified osteoarthritis, unspecified site: Secondary | ICD-10-CM | POA: Diagnosis not present

## 2023-08-11 DIAGNOSIS — I872 Venous insufficiency (chronic) (peripheral): Secondary | ICD-10-CM | POA: Diagnosis not present

## 2023-08-11 DIAGNOSIS — M25472 Effusion, left ankle: Secondary | ICD-10-CM | POA: Diagnosis not present

## 2023-08-11 DIAGNOSIS — M25471 Effusion, right ankle: Secondary | ICD-10-CM | POA: Diagnosis not present

## 2023-08-11 DIAGNOSIS — M25572 Pain in left ankle and joints of left foot: Secondary | ICD-10-CM | POA: Diagnosis not present

## 2023-08-11 DIAGNOSIS — R2689 Other abnormalities of gait and mobility: Secondary | ICD-10-CM | POA: Diagnosis not present

## 2023-08-11 DIAGNOSIS — M2170 Unequal limb length (acquired), unspecified site: Secondary | ICD-10-CM | POA: Diagnosis not present

## 2023-08-15 ENCOUNTER — Other Ambulatory Visit: Payer: Self-pay | Admitting: Family

## 2023-08-15 ENCOUNTER — Other Ambulatory Visit: Payer: Self-pay | Admitting: Family Medicine

## 2023-08-15 DIAGNOSIS — R0982 Postnasal drip: Secondary | ICD-10-CM

## 2023-08-19 DIAGNOSIS — Z1231 Encounter for screening mammogram for malignant neoplasm of breast: Secondary | ICD-10-CM | POA: Diagnosis not present

## 2023-08-19 DIAGNOSIS — B372 Candidiasis of skin and nail: Secondary | ICD-10-CM | POA: Diagnosis not present

## 2023-08-19 DIAGNOSIS — Z01419 Encounter for gynecological examination (general) (routine) without abnormal findings: Secondary | ICD-10-CM | POA: Diagnosis not present

## 2023-08-19 DIAGNOSIS — Z780 Asymptomatic menopausal state: Secondary | ICD-10-CM | POA: Diagnosis not present

## 2023-08-19 DIAGNOSIS — Z01411 Encounter for gynecological examination (general) (routine) with abnormal findings: Secondary | ICD-10-CM | POA: Diagnosis not present

## 2023-09-25 DIAGNOSIS — M47816 Spondylosis without myelopathy or radiculopathy, lumbar region: Secondary | ICD-10-CM | POA: Diagnosis not present

## 2023-09-25 DIAGNOSIS — M545 Low back pain, unspecified: Secondary | ICD-10-CM | POA: Diagnosis not present

## 2023-09-25 DIAGNOSIS — G8929 Other chronic pain: Secondary | ICD-10-CM | POA: Diagnosis not present

## 2023-09-29 ENCOUNTER — Ambulatory Visit: Payer: Medicare PPO | Admitting: Family Medicine

## 2023-09-29 ENCOUNTER — Encounter: Payer: Self-pay | Admitting: Family Medicine

## 2023-09-29 VITALS — BP 131/74 | HR 50 | Temp 97.8°F | Ht 60.0 in | Wt 181.0 lb

## 2023-09-29 DIAGNOSIS — H109 Unspecified conjunctivitis: Secondary | ICD-10-CM | POA: Diagnosis not present

## 2023-09-29 DIAGNOSIS — H01009 Unspecified blepharitis unspecified eye, unspecified eyelid: Secondary | ICD-10-CM

## 2023-09-29 DIAGNOSIS — I10 Essential (primary) hypertension: Secondary | ICD-10-CM

## 2023-09-29 DIAGNOSIS — E039 Hypothyroidism, unspecified: Secondary | ICD-10-CM | POA: Diagnosis not present

## 2023-09-29 LAB — TSH: TSH: 0.19 u[IU]/mL — ABNORMAL LOW (ref 0.35–5.50)

## 2023-09-29 NOTE — Assessment & Plan Note (Signed)
Blood pressure at goal on HCTZ 12.5 mg once daily.

## 2023-09-29 NOTE — Progress Notes (Signed)
   Amanda Mathews is a 77 y.o. female who presents today for an office visit.  Assessment/Plan:  New/Acute Problems: Conjunctivitis No red flags.  Reassuring exam today.  Likely allergic.  She can try over-the-counter Pataday drops.  She does have a small excoriated area just lateral to the corner of her right eye.  She can try using small mount of cortisone cream to the area though advised patient do not get any cream into her eye itself.  She will let us know if not proving and we can refer to ophthalmology.  We discussed reasons to return to care.  Chronic Problems Addressed Today: Essential hypertension Blood pressure at goal on HCTZ 12.5 mg once daily.  Hypothyroidism Check TSH.  Continue Synthroid 150 mcg daily.  She is doing well this.     Subjective:  HPI:  See Assessment / plan for status of chronic conditions.    She does have some irritation in the corner of her right eye. This has been present for a couple of weeks. No drainage.  No injuries or precipitating events.       Objective:  Physical Exam: BP 131/74   Pulse (!) 50   Temp 97.8 F (36.6 C) (Temporal)   Ht 5' (1.524 m)   Wt 181 lb (82.1 kg)   SpO2 96%   BMI 35.35 kg/m   Gen: No acute distress, resting comfortably HEENT: Extraocular eye movements intact.  Pupils equally round and reactive to light.  Small excoriated area at corner of right eye noted.  Bilateral conjunctiva slightly erythematous.  CV: Regular rate and rhythm with no murmurs appreciated Pulm: Normal work of breathing, clear to auscultation bilaterally with no crackles, wheezes, or rhonchi Neuro: Grossly normal, moves all extremities Psych: Normal affect and thought content      Alfonzia Woolum M. [name], MD 09/29/2023 11:10 AM

## 2023-09-29 NOTE — Patient Instructions (Addendum)
It was very nice to see you today!  We will check blood work today.  Please try using over-the-counter Pataday drops to see if this helps with your eyes.  You can also try a small Mehta cortisone cream on the irritated skin on your face.  Return in about 6 months (around 03/29/2024) for Annual Physical.   Take care, Dr [name]  PLEASE NOTE:  If you had any lab tests, please let us know if you have not heard back within a few days. You may see your results on mychart before we have a chance to review them but we will give you a call once they are reviewed by [place].   If we ordered any referrals today, please let us know if you have not heard from their office within the next week.   If you had any urgent prescriptions sent in today, please check with the pharmacy within an hour of our visit to make sure the prescription was transmitted appropriately.   Please try these tips to maintain a healthy lifestyle:  Eat at least 3 REAL meals and 1-2 snacks per day.  Aim for no more than 5 hours between eating.  If you eat breakfast, please do so within one hour of getting up.   Each meal should contain half fruits/vegetables, one quarter protein, and one quarter carbs (no bigger than a computer mouse)  Cut down on sweet beverages. This includes juice, soda, and sweet tea.   Drink at least 1 glass of water with each meal and aim for at least 8 glasses per day  Exercise at least 150 minutes every week.

## 2023-09-29 NOTE — Assessment & Plan Note (Signed)
Check TSH.  Continue Synthroid 150 mcg daily.  She is doing well this.

## 2023-10-01 ENCOUNTER — Other Ambulatory Visit: Payer: Self-pay | Admitting: *Deleted

## 2023-10-01 MED ORDER — METHOCARBAMOL 750 MG PO TABS: ORAL_TABLET | ORAL | 0 refills | Status: DC

## 2023-10-01 MED ORDER — MELOXICAM 7.5 MG PO TABS: 7.5000 mg | ORAL_TABLET | Freq: Every day | ORAL | 0 refills | Status: DC

## 2023-10-02 NOTE — Progress Notes (Signed)
Thyroid level is a little bit off.  Please make sure that she has been taking his prescribed.  If she has recommend we decrease dose to 137 mcg daily.  Please send a prescription if needed.  We should recheck in 4 to 6 weeks.

## 2023-10-03 ENCOUNTER — Telehealth: Payer: Self-pay | Admitting: Family Medicine

## 2023-10-03 NOTE — Telephone Encounter (Signed)
Patient returned call. Requests to be called. 

## 2023-10-06 ENCOUNTER — Other Ambulatory Visit: Payer: Self-pay | Admitting: *Deleted

## 2023-10-06 DIAGNOSIS — E039 Hypothyroidism, unspecified: Secondary | ICD-10-CM

## 2023-10-06 MED ORDER — LEVOTHYROXINE SODIUM 137 MCG PO TABS: 137.0000 ug | ORAL_TABLET | Freq: Every day | ORAL | 1 refills | Status: DC

## 2023-10-06 NOTE — Telephone Encounter (Signed)
See results note. 

## 2023-10-27 ENCOUNTER — Other Ambulatory Visit: Payer: Self-pay | Admitting: Family Medicine

## 2023-10-28 ENCOUNTER — Other Ambulatory Visit: Payer: Self-pay | Admitting: Family Medicine

## 2023-10-30 DIAGNOSIS — M47816 Spondylosis without myelopathy or radiculopathy, lumbar region: Secondary | ICD-10-CM | POA: Diagnosis not present

## 2023-10-30 DIAGNOSIS — M545 Low back pain, unspecified: Secondary | ICD-10-CM | POA: Diagnosis not present

## 2023-11-05 ENCOUNTER — Ambulatory Visit: Payer: Medicare PPO

## 2023-11-05 VITALS — Wt 181.0 lb

## 2023-11-05 DIAGNOSIS — Z0000 Encounter for general adult medical examination without abnormal findings: Secondary | ICD-10-CM

## 2023-11-05 NOTE — Patient Instructions (Signed)
Amanda Mathews , Thank you for taking time to come for your Medicare Wellness Visit. I appreciate your ongoing commitment to your health goals. Please review the following plan we discussed and let me know if I can assist you in the future.   Referrals/Orders/Follow-Ups/Clinician Recommendations: work toward waling without walker  Aim for 30 minutes of exercise or brisk walking, 6-8 glasses of water, and 5 servings of fruits and vegetables each day. Each day, aim for 6 glasses of water, plenty of protein in your diet and try to get up and walk/ stretch every hour for 5-10 minutes at a time.    This is a list of the screening recommended for you and due dates:  Health Maintenance  Topic Date Due   Medicare Annual Wellness Visit  11/04/2024   Colon Cancer Screening  01/06/2030   DTaP/Tdap/Td vaccine (3 - Td or Tdap) 04/26/2033   Pneumonia Vaccine  Completed   Flu Shot  Completed   DEXA scan (bone density measurement)  Completed   COVID-19 Vaccine  Completed   Hepatitis C Screening  Completed   Zoster (Shingles) Vaccine  Completed   HPV Vaccine  Aged Out    Advanced directives: (In Chart) A copy of your advanced directives are scanned into your chart should your provider ever need it.  Next Medicare Annual Wellness Visit scheduled for next year: Yes

## 2023-11-05 NOTE — Addendum Note (Signed)
Addended by: Marzella Schlein on: 11/05/2023 01:27 PM   Modules accepted: Level of Service

## 2023-11-05 NOTE — Progress Notes (Deleted)
Subjective:   Amanda Mathews is a 77 y.o. female who presents for Medicare Annual (Subsequent) preventive examination.  Visit Complete: Virtual I connected with  Amanda Mathews on 11/05/23 by a audio enabled telemedicine application and verified that I am speaking with the correct person using two identifiers.  Patient Location: Home  Provider Location: Home Office  I discussed the limitations of evaluation and management by telemedicine. The patient expressed understanding and agreed to proceed.  Vital Signs: Because this visit was a virtual/telehealth visit, some criteria may be missing or patient reported. Any vitals not documented were not able to be obtained and vitals that have been documented are patient reported.   Cardiac Risk Factors include: advanced age (>51men, >65 women);dyslipidemia;hypertension;obesity (BMI >30kg/m2)     Objective:    Today's Vitals   11/05/23 1134  Weight: 181 lb (82.1 kg)   Body mass index is 35.35 kg/m.     11/05/2023   11:40 AM 04/27/2023    6:32 AM 10/25/2022    8:18 AM 05/06/2022    9:31 AM 02/19/2022    3:02 AM 12/25/2021   10:46 AM 10/12/2021    8:13 AM  Advanced Directives  Does Patient Have a Medical Advance Directive? Yes No Yes No No No Yes  Type of Estate agent of [place];Living will  Healthcare Power of [place];Living will    Healthcare Power of Attorney  Does patient want to make changes to medical advance directive? No - Patient declined  No - Patient declined      Copy of Healthcare Power of Attorney in Chart? Yes - validated most recent copy scanned in chart (See row information)  Yes - validated most recent copy scanned in chart (See row information)    Yes - validated most recent copy scanned in chart (See row information)  Would patient like information on creating a medical advance directive?     No - Patient declined No - Patient declined     Current Medications (verified) Outpatient  Encounter Medications as of 11/05/2023  Medication Sig   acetaminophen (TYLENOL) 500 MG tablet Take 500 mg by mouth every 6 (six) hours as needed for mild pain.   ammonium lactate (AMLACTIN) 12 % cream APPLY THIN COAT TO AFFECTED AREAS & RUB IN TWICE A DAY IN THE MORNING & IN THE EVENING   Calcium Carb-Cholecalciferol (CALCIUM 600-D PO) Take 600 mg by mouth daily.   carbamazepine (TEGRETOL) 200 MG tablet Take 200 mg by mouth. 5 times daily 200 mg   Cholecalciferol (VITAMIN D3) 125 MCG (5000 UT) CAPS Take 5,000 Units by mouth daily.   denosumab (PROLIA) 60 MG/ML SOSY injection Inject 60 mg into the skin every 6 (six) months.   gabapentin (NEURONTIN) 300 MG capsule TAKE 1 CAPSULE BY MOUTH THREE TIMES A DAY (Patient taking differently: Twice a day)   hydrochlorothiazide (HYDRODIURIL) 25 MG tablet TAKE 1 TABLET (25 MG TOTAL) BY MOUTH DAILY.   hydrocortisone 2.5 % cream Apply topically daily as needed.   levothyroxine (SYNTHROID) 137 MCG tablet Take 1 tablet (137 mcg total) by mouth daily before breakfast.   MAGNESIUM PO Take 1 tablet by mouth 2 (two) times daily. 250 mg   meloxicam (MOBIC) 7.5 MG tablet TAKE 1 TABLET BY MOUTH EVERY DAY   methocarbamol (ROBAXIN) 750 MG tablet TAKE 1 TABLET BY MOUTH TWICE A DAY AS NEEDED FOR MUSCLE SPASM   nystatin cream (MYCOSTATIN) nystatin 100,000 unit/gram topical cream   ondansetron (ZOFRAN) 4 MG  tablet Take 1 tablet (4 mg total) by mouth every 8 (eight) hours as needed for nausea or vomiting.   potassium chloride SA (KLOR-CON) 20 MEQ tablet Take 20 mEq by mouth daily.   simvastatin (ZOCOR) 40 MG tablet Take 1 tablet (40 mg total) by mouth at bedtime.   triamcinolone cream (KENALOG) 0.1 % MIX WITH NYSTATIN AND APPLY A THIN LAYER TO THE AFFECTED AREA TWICE DAILY AS NEEDED   No facility-administered encounter medications on file as of 11/05/2023.    Allergies (verified) Codeine, Hydrocodone, Hydromorphone, Morphine, Oxycodone, and Ropinirole   History: Past  Medical History:  Diagnosis Date   Cataract    Osteoporosis    Sleep apnea    Thyroid disease    Past Surgical History:  Procedure Laterality Date   back stimulator      BUNIONECTOMY     CHOLECYSTECTOMY     FEMUR FRACTURE SURGERY     REPLACEMENT TOTAL KNEE BILATERAL Bilateral    Family History  Problem Relation Age of Onset   Hypertension Mother    Stroke Mother    Arthritis Sister    COPD Sister    Depression Sister    Diabetes Sister    Alcohol abuse Brother    Hypertension Sister    Social History   Socioeconomic History   Marital status: Divorced    Spouse name: Not on file   Number of children: Not on file   Years of education: Not on file   Highest education level: Associate degree: occupational, Scientist, product/process development, or vocational program  Occupational History   Occupation: Retired    Occupation: retired    Comment: [employer]  Tobacco Use   Smoking status: Never   Smokeless tobacco: Never  Vaping Use   Vaping status: Never Used  Substance and Sexual Activity   Alcohol use: Never   Drug use: Never   Sexual activity: Not Currently    Birth control/protection: Abstinence  Other Topics Concern   Not on file  Social History Narrative   Right handed   Live alone 3 stairs to door   Caffeine 2 times daily   Social Determinants of Health   Financial Resource Strain: Low Risk  (05/21/2023)   Overall Financial Resource Strain (CARDIA)    Difficulty of Paying Living Expenses: Not hard at all  Food Insecurity: No Food Insecurity (05/21/2023)   Hunger Vital Sign    Worried About Running Out of Food in the Last Year: Never true    Ran Out of Food in the Last Year: Never true  Transportation Needs: No Transportation Needs (05/21/2023)   PRAPARE - Administrator, Civil Service (Medical): No    Lack of Transportation (Non-Medical): No  Physical Activity: Unknown (05/21/2023)   Exercise Vital Sign    Days of Exercise per Week: 0 days    Minutes of Exercise per Session:  Not on file  Recent Concern: Physical Activity - Inactive (05/21/2023)   Exercise Vital Sign    Days of Exercise per Week: 0 days    Minutes of Exercise per Session: 60 min  Stress: No Stress Concern Present (05/21/2023)   [employer] of Occupational Health - Occupational Stress Questionnaire    Feeling of Stress : Not at all  Social Connections: Moderately Integrated (05/21/2023)   Social Connection and Isolation Panel [NHANES]    Frequency of Communication with Friends and Family: More than three times a week    Frequency of Social Gatherings with Friends and Family:  More than three times a week    Attends Religious Services: More than 4 times per year    Active Member of Clubs or Organizations: Yes    Attends Banker Meetings: More than 4 times per year    Marital Status: Divorced    Tobacco Counseling Counseling given: Not Answered   Clinical Intake:  Pre-visit preparation completed: Yes  Pain : No/denies pain     BMI - recorded: 35.35 Nutritional Status: BMI > 30  Obese Nutritional Risks: None Diabetes: No     Interpreter Needed?: No  Information entered by :: Lanier Ensign, LPN   Activities of Daily Living    11/05/2023   11:37 AM  In your present state of health, do you have any difficulty performing the following activities:  Hearing? 0  Vision? 0  Difficulty concentrating or making decisions? 0  Walking or climbing stairs? 0  Dressing or bathing? 0  Doing errands, shopping? 0  Preparing Food and eating ? N  Using the Toilet? N  In the past six months, have you accidently leaked urine? N  Do you have problems with loss of bowel control? N  Managing your Medications? N  Managing your Finances? N  Housekeeping or managing your Housekeeping? N    Patient Care Team: Ardith Dark, MD as PCP - General (Family Medicine)  Indicate any recent Medical Services you may have received from other than [facility] providers in the past year (date  may be approximate).     Assessment:   This is a routine wellness examination for Omia.  Hearing/Vision screen Hearing Screening - Comments:: Pt denies any hearing issues  Vision Screening - Comments:: Pt [facility] [place]   Goals Addressed             This Visit's Progress    Patient Stated       Work on getting rid of walker        Depression Screen    09/29/2023   10:38 AM 05/30/2023   10:39 AM 05/30/2023   10:38 AM 05/22/2023    7:24 AM 04/17/2023    7:25 AM 03/20/2023    8:17 AM 03/20/2023    7:49 AM  PHQ 2/9 Scores  PHQ - 2 Score 0 0 0 0 0 0 0  PHQ- 9 Score 0     0     Fall Risk    11/05/2023   11:40 AM 09/29/2023   10:39 AM 05/30/2023   10:39 AM 05/22/2023    7:24 AM 05/02/2023   10:10 AM  Fall Risk   Falls in the past year? 1 0 1 0 1  Number falls in past yr: 0 0 1 0 0  Injury with Fall? 0 0 0 0 1  Risk for fall due to : Impaired balance/gait;Impaired mobility;Impaired vision;History of fall(s) No Fall Risks  No Fall Risks Impaired balance/gait;No Fall Risks  Follow up Falls prevention discussed        MEDICARE RISK AT HOME: Medicare Risk at Home Any stairs in or around the home?: Yes If so, are there any without handrails?: No Home free of loose throw rugs in walkways, pet beds, electrical cords, etc?: Yes Adequate lighting in your home to reduce risk of falls?: Yes Life alert?: No Use of a cane, walker or w/c?: Yes Grab bars in the bathroom?: Yes Shower chair or bench in shower?: Yes Elevated toilet seat or a handicapped toilet?: No  TIMED UP AND GO:  Was the test performed?  No    Cognitive Function:        11/05/2023   11:42 AM 10/25/2022    8:23 AM 10/12/2021    8:17 AM 10/06/2020    8:18 AM  6CIT Screen  What Year? 0 points 0 points 0 points 0 points  What month? 0 points 0 points 0 points 0 points  What time? 0 points 0 points 0 points   Count back from 20 0 points 0 points 0 points 0 points  Months in reverse 0  points 0 points 0 points 0 points  Repeat phrase 0 points 0 points 0 points 0 points  Total Score 0 points 0 points 0 points     Immunizations Immunization History  Administered Date(s) Administered   Fluad Quad(high Dose 65+) 08/30/2021   Influenza, High Dose Seasonal PF 10/26/2019, 08/20/2023   Influenza,inj,quad, With Preservative 05/16/2017, 09/15/2018, 08/17/2019   Influenza-Unspecified 10/15/2012, 09/06/2013, 09/12/2014, 09/26/2015, 09/24/2017, 09/25/2018, 08/11/2022   Moderna Covid-19 Fall Seasonal Vaccine 73yrs & older 08/20/2023   Moderna Sars-Covid-2 Vaccination 01/25/2020, 02/22/2020, 09/04/2020, 10/09/2020, 03/14/2021   PPD Test 09/12/2014   Pfizer Covid-19 Vaccine Bivalent Booster 67yrs & up 09/07/2021, 06/08/2022   Pneumococcal Conjugate,unspecified 09/16/2015   Pneumococcal Conjugate-13 01/23/2021   Pneumococcal Polysaccharide-23 04/29/2022   Pneumococcal-Unspecified 07/07/2013, 11/21/2014, 09/16/2015   Respiratory Syncytial Virus Vaccine,Recomb Aduvanted(Arexvy) 11/09/2022   Tdap 09/12/2014, 04/27/2023   Unspecified SARS-COV-2 Vaccination 01/25/2020, 02/13/2021, 12/08/2022   Zoster Recombinant(Shingrix) 02/07/2023, 04/08/2023   Zoster, Live 12/14/2013, 09/16/2017, 04/01/2018    TDAP status: Up to date  Flu Vaccine status: Up to date  Pneumococcal vaccine status: Up to date  Covid-19 vaccine status: Completed vaccines  Qualifies for Shingles Vaccine? Yes   Zostavax completed Yes   Shingrix Completed?: Yes  Screening Tests Health Maintenance  Topic Date Due   Medicare Annual Wellness (AWV)  11/04/2024   Colonoscopy  01/06/2030   DTaP/Tdap/Td (3 - Td or Tdap) 04/26/2033   Pneumonia Vaccine 48+ Years old  Completed   INFLUENZA VACCINE  Completed   DEXA SCAN  Completed   COVID-19 Vaccine  Completed   Hepatitis C Screening  Completed   Zoster Vaccines- Shingrix  Completed   HPV VACCINES  Aged Out    Health Maintenance  There are no preventive care  reminders to display for this patient.   Colorectal cancer screening: Type of screening: Colonoscopy. Completed 01/07/20. Repeat every 10 years  Mammogram status: Completed 08/10/22. Repeat every year  Bone Density status: Completed 08/24/21. Results reflect: Bone density results: OSTEOPOROSIS. Repeat every 2 years.   Additional Screening:  Hepatitis C Screening:  Completed 04/29/22  Vision Screening: Recommended annual ophthalmology exams for early detection of glaucoma and other disorders of the eye. Is the patient up to date with their annual eye exam?  Yes  Who is the provider or what is the name of the office in which the patient attends annual eye exams? [facility]  If pt is not established with a provider, would they like to be referred to a provider to establish care? No .   Dental Screening: Recommended annual dental exams for proper oral hygiene   Community Resource Referral / Chronic Care Management: CRR required this visit?  No   CCM required this visit?  No     Plan:     I have personally reviewed and noted the following in the patient's chart:   Medical and social history Use of alcohol, tobacco or  illicit drugs  Current medications and supplements including opioid prescriptions. Patient is not currently taking opioid prescriptions. Functional ability and status Nutritional status Physical activity Advanced directives List of other physicians Hospitalizations, surgeries, and ER visits in previous 12 months Vitals Screenings to include cognitive, depression, and falls Referrals and appointments  In addition, I have reviewed and discussed with patient certain preventive protocols, quality metrics, and best practice recommendations. A written personalized care plan for preventive services as well as general preventive health recommendations were provided to patient.     Marzella Schlein, LPN   16/09/[id number]   After Visit Summary: (MyChart) Due to this being a  telephonic visit, the after visit summary with patients personalized plan was offered to patient via MyChart   Nurse Notes: none

## 2023-11-10 NOTE — Progress Notes (Signed)
Subjective:   Amanda Mathews is a 77 y.o. female who presents for Medicare Annual (Subsequent) preventive examination.  Visit Complete: Virtual I connected with  Amanda Mathews on 11/10/23 by a audio enabled telemedicine application and verified that I am speaking with the correct person using two identifiers.  Patient Location: Home  Provider Location: Home Office  I discussed the limitations of evaluation and management by telemedicine. The patient expressed understanding and agreed to proceed.  Vital Signs: Because this visit was a virtual/telehealth visit, some criteria may be missing or patient reported. Any vitals not documented were not able to be obtained and vitals that have been documented are patient reported.   Cardiac Risk Factors include: advanced age (>46men, >85 women);dyslipidemia;hypertension;obesity (BMI >30kg/m2)     Objective:    Today's Vitals   11/05/23 1134  Weight: 181 lb (82.1 kg)   Body mass index is 35.35 kg/m.     11/05/2023   11:40 AM 04/27/2023    6:32 AM 10/25/2022    8:18 AM 05/06/2022    9:31 AM 02/19/2022    3:02 AM 12/25/2021   10:46 AM 10/12/2021    8:13 AM  Advanced Directives  Does Patient Have a Medical Advance Directive? Yes No Yes No No No Yes  Type of Estate agent of [place];Living will  Healthcare Power of [place];Living will    Healthcare Power of Attorney  Does patient want to make changes to medical advance directive? No - Patient declined  No - Patient declined      Copy of Healthcare Power of Attorney in Chart? Yes - validated most recent copy scanned in chart (See row information)  Yes - validated most recent copy scanned in chart (See row information)    Yes - validated most recent copy scanned in chart (See row information)  Would patient like information on creating a medical advance directive?     No - Patient declined No - Patient declined     Current Medications (verified) Outpatient  Encounter Medications as of 11/05/2023  Medication Sig   acetaminophen (TYLENOL) 500 MG tablet Take 500 mg by mouth every 6 (six) hours as needed for mild pain.   ammonium lactate (AMLACTIN) 12 % cream APPLY THIN COAT TO AFFECTED AREAS & RUB IN TWICE A DAY IN THE MORNING & IN THE EVENING   Calcium Carb-Cholecalciferol (CALCIUM 600-D PO) Take 600 mg by mouth daily.   carbamazepine (TEGRETOL) 200 MG tablet Take 200 mg by mouth. 5 times daily 200 mg   Cholecalciferol (VITAMIN D3) 125 MCG (5000 UT) CAPS Take 5,000 Units by mouth daily.   denosumab (PROLIA) 60 MG/ML SOSY injection Inject 60 mg into the skin every 6 (six) months.   gabapentin (NEURONTIN) 300 MG capsule TAKE 1 CAPSULE BY MOUTH THREE TIMES A DAY (Patient taking differently: Twice a day)   hydrochlorothiazide (HYDRODIURIL) 25 MG tablet TAKE 1 TABLET (25 MG TOTAL) BY MOUTH DAILY.   hydrocortisone 2.5 % cream Apply topically daily as needed.   levothyroxine (SYNTHROID) 137 MCG tablet Take 1 tablet (137 mcg total) by mouth daily before breakfast.   MAGNESIUM PO Take 1 tablet by mouth 2 (two) times daily. 250 mg   meloxicam (MOBIC) 7.5 MG tablet TAKE 1 TABLET BY MOUTH EVERY DAY   methocarbamol (ROBAXIN) 750 MG tablet TAKE 1 TABLET BY MOUTH TWICE A DAY AS NEEDED FOR MUSCLE SPASM   nystatin cream (MYCOSTATIN) nystatin 100,000 unit/gram topical cream   ondansetron (ZOFRAN) 4 MG  tablet Take 1 tablet (4 mg total) by mouth every 8 (eight) hours as needed for nausea or vomiting.   potassium chloride SA (KLOR-CON) 20 MEQ tablet Take 20 mEq by mouth daily.   simvastatin (ZOCOR) 40 MG tablet Take 1 tablet (40 mg total) by mouth at bedtime.   triamcinolone cream (KENALOG) 0.1 % MIX WITH NYSTATIN AND APPLY A THIN LAYER TO THE AFFECTED AREA TWICE DAILY AS NEEDED   No facility-administered encounter medications on file as of 11/05/2023.    Allergies (verified) Codeine, Hydrocodone, Hydromorphone, Morphine, Oxycodone, and Ropinirole   History: Past  Medical History:  Diagnosis Date   Cataract    Osteoporosis    Sleep apnea    Thyroid disease    Past Surgical History:  Procedure Laterality Date   back stimulator      BUNIONECTOMY     CHOLECYSTECTOMY     FEMUR FRACTURE SURGERY     REPLACEMENT TOTAL KNEE BILATERAL Bilateral    Family History  Problem Relation Age of Onset   Hypertension Mother    Stroke Mother    Arthritis Sister    COPD Sister    Depression Sister    Diabetes Sister    Alcohol abuse Brother    Hypertension Sister    Social History   Socioeconomic History   Marital status: Divorced    Spouse name: Not on file   Number of children: Not on file   Years of education: Not on file   Highest education level: Associate degree: occupational, Scientist, product/process development, or vocational program  Occupational History   Occupation: Retired    Occupation: retired    Comment:  [employer]  Tobacco Use   Smoking status: Never   Smokeless tobacco: Never  Vaping Use   Vaping status: Never Used  Substance and Sexual Activity   Alcohol use: Never   Drug use: Never   Sexual activity: Not Currently    Birth control/protection: Abstinence  Other Topics Concern   Not on file  Social History Narrative   Right handed   Live alone 3 stairs to door   Caffeine 2 times daily   Social Determinants of Health   Financial Resource Strain: Low Risk  (11/05/2023)   Overall Financial Resource Strain (CARDIA)    Difficulty of Paying Living Expenses: Not hard at all  Food Insecurity: No Food Insecurity (11/05/2023)   Hunger Vital Sign    Worried About Running Out of Food in the Last Year: Never true    Ran Out of Food in the Last Year: Never true  Transportation Needs: No Transportation Needs (11/05/2023)   PRAPARE - Administrator, Civil Service (Medical): No    Lack of Transportation (Non-Medical): No  Physical Activity: Insufficiently Active (11/05/2023)   Exercise Vital Sign    Days of Exercise per Week: 1 day    Minutes of  Exercise per Session: 60 min  Stress: No Stress Concern Present (11/05/2023)   [employer] of Occupational Health - Occupational Stress Questionnaire    Feeling of Stress : Not at all  Social Connections: Moderately Integrated (11/05/2023)   Social Connection and Isolation Panel [NHANES]    Frequency of Communication with Friends and Family: More than three times a week    Frequency of Social Gatherings with Friends and Family: More than three times a week    Attends Religious Services: More than 4 times per year    Active Member of [employer] or Organizations: Yes    Attends [employer]  or Organization Meetings: 1 to 4 times per year    Marital Status: Divorced    Tobacco Counseling Counseling given: Not Answered   Clinical Intake:  Pre-visit preparation completed: Yes  Pain : No/denies pain     BMI - recorded: 35.35 Nutritional Status: BMI > 30  Obese Nutritional Risks: None Diabetes: No     Interpreter Needed?: No  Information entered by :: Lanier Ensign, LPN   Activities of Daily Living    11/05/2023   11:37 AM  In your present state of health, do you have any difficulty performing the following activities:  Hearing? 0  Vision? 0  Difficulty concentrating or making decisions? 0  Walking or climbing stairs? 0  Dressing or bathing? 0  Doing errands, shopping? 0  Preparing Food and eating ? N  Using the Toilet? N  In the past six months, have you accidently leaked urine? N  Do you have problems with loss of bowel control? N  Managing your Medications? N  Managing your Finances? N  Housekeeping or managing your Housekeeping? N    Patient Care Team: Ardith Dark, MD as PCP - General (Family Medicine)  Indicate any recent Medical Services you may have received from other than [facility] providers in the past year (date may be approximate).     Assessment:   This is a routine wellness examination for Deshante.  Hearing/Vision screen Hearing Screening -  Comments:: Pt denies any hearing issues  Vision Screening - Comments:: [facility] [place]   Goals Addressed             This Visit's Progress    Patient Stated       Work on getting rid of walker        Depression Screen    11/05/2023    3:10 PM 09/29/2023   10:38 AM 05/30/2023   10:39 AM 05/30/2023   10:38 AM 05/22/2023    7:24 AM 04/17/2023    7:25 AM 03/20/2023    8:17 AM  PHQ 2/9 Scores  PHQ - 2 Score 0 0 0 0 0 0 0  PHQ- 9 Score 0 0     0    Fall Risk    11/05/2023   11:40 AM 09/29/2023   10:39 AM 05/30/2023   10:39 AM 05/22/2023    7:24 AM 05/02/2023   10:10 AM  Fall Risk   Falls in the past year? 1 0 1 0 1  Number falls in past yr: 0 0 1 0 0  Injury with Fall? 0 0 0 0 1  Risk for fall due to : Impaired balance/gait;Impaired mobility;Impaired vision;History of fall(s) No Fall Risks  No Fall Risks Impaired balance/gait;No Fall Risks  Follow up Falls prevention discussed        MEDICARE RISK AT HOME: Medicare Risk at Home Any stairs in or around the home?: Yes If so, are there any without handrails?: No Home free of loose throw rugs in walkways, pet beds, electrical cords, etc?: Yes Adequate lighting in your home to reduce risk of falls?: Yes Life alert?: No Use of a cane, walker or w/c?: Yes Grab bars in the bathroom?: Yes Shower chair or bench in shower?: Yes Elevated toilet seat or a handicapped toilet?: No  TIMED UP AND GO:  Was the test performed?  No    Cognitive Function:        11/05/2023   11:42 AM 10/25/2022    8:23  AM 10/12/2021    8:17 AM 10/06/2020    8:18 AM  6CIT Screen  What Year? 0 points 0 points 0 points 0 points  What month? 0 points 0 points 0 points 0 points  What time? 0 points 0 points 0 points   Count back from 20 0 points 0 points 0 points 0 points  Months in reverse 0 points 0 points 0 points 0 points  Repeat phrase 0 points 0 points 0 points 0 points  Total Score 0 points 0 points 0 points      Immunizations Immunization History  Administered Date(s) Administered   Fluad Quad(high Dose 65+) 08/30/2021   Influenza, High Dose Seasonal PF 10/26/2019, 08/20/2023   Influenza,inj,quad, With Preservative 05/16/2017, 09/15/2018, 08/17/2019   Influenza-Unspecified 10/15/2012, 09/06/2013, 09/12/2014, 09/26/2015, 09/24/2017, 09/25/2018, 08/11/2022   Moderna Covid-19 Fall Seasonal Vaccine 50yrs & older 08/20/2023   Moderna Sars-Covid-2 Vaccination 01/25/2020, 02/22/2020, 09/04/2020, 10/09/2020, 03/14/2021   PPD Test 09/12/2014   Pfizer Covid-19 Vaccine Bivalent Booster 99yrs & up 09/07/2021, 06/08/2022   Pneumococcal Conjugate,unspecified 09/16/2015   Pneumococcal Conjugate-13 01/23/2021   Pneumococcal Polysaccharide-23 04/29/2022   Pneumococcal-Unspecified 07/07/2013, 11/21/2014, 09/16/2015   Respiratory Syncytial Virus Vaccine,Recomb Aduvanted(Arexvy) 11/09/2022   Tdap 09/12/2014, 04/27/2023   Unspecified SARS-COV-2 Vaccination 01/25/2020, 02/13/2021, 12/08/2022   Zoster Recombinant(Shingrix) 02/07/2023, 04/08/2023   Zoster, Live 12/14/2013, 09/16/2017, 04/01/2018    TDAP status: Up to date  Flu Vaccine status: Up to date  Pneumococcal vaccine status: Up to date  Covid-19 vaccine status: Completed vaccines  Qualifies for Shingles Vaccine? Yes   Zostavax completed Yes   Shingrix Completed?: Yes  Screening Tests Health Maintenance  Topic Date Due   Medicare Annual Wellness (AWV)  11/04/2024   Colonoscopy  01/06/2030   DTaP/Tdap/Td (3 - Td or Tdap) 04/26/2033   Pneumonia Vaccine 29+ Years old  Completed   INFLUENZA VACCINE  Completed   DEXA SCAN  Completed   COVID-19 Vaccine  Completed   Hepatitis C Screening  Completed   Zoster Vaccines- Shingrix  Completed   HPV VACCINES  Aged Out    Health Maintenance  There are no preventive care reminders to display for this patient.   Colorectal cancer screening: Type of screening: Colonoscopy. Completed 01/07/20. Repeat  every 10 years  Mammogram status: Completed 08/10/22. Repeat every year  Bone Density status: Completed 08/24/21. Results reflect: Bone density results: OSTEOPOROSIS. Repeat every 2 years.   Additional Screening:  Hepatitis C Screening:  Completed 04/29/22  Vision Screening: Recommended annual ophthalmology exams for early detection of glaucoma and other disorders of the eye. Is the patient up to date with their annual eye exam?  Yes  Who is the provider or what is the name of the office in which the patient attends annual eye exams? [facility]  If pt is not established with a provider, would they like to be referred to a provider to establish care? No .   Dental Screening: Recommended annual dental exams for proper oral hygiene   Community Resource Referral / Chronic Care Management: CRR required this visit?  No   CCM required this visit?  No     Plan:     I have personally reviewed and noted the following in the patient's chart:   Medical and social history Use of alcohol, tobacco or illicit drugs  Current medications and supplements including opioid prescriptions. Patient is not currently taking opioid prescriptions. Functional ability and status Nutritional status Physical activity Advanced directives List of other physicians Hospitalizations, surgeries,  and ER visits in previous 12 months Vitals Screenings to include cognitive, depression, and falls Referrals and appointments  In addition, I have reviewed and discussed with patient certain preventive protocols, quality metrics, and best practice recommendations. A written personalized care plan for preventive services as well as general preventive health recommendations were provided to patient.     Marzella Schlein, LPN   [id number]   After Visit Summary: (MyChart) Due to this being a telephonic visit, the after visit summary with patients personalized plan was offered to patient via MyChart   Nurse Notes: none

## 2023-11-17 ENCOUNTER — Other Ambulatory Visit (INDEPENDENT_AMBULATORY_CARE_PROVIDER_SITE_OTHER): Payer: Medicare PPO

## 2023-11-17 DIAGNOSIS — R350 Frequency of micturition: Secondary | ICD-10-CM

## 2023-11-17 DIAGNOSIS — E039 Hypothyroidism, unspecified: Secondary | ICD-10-CM

## 2023-11-17 LAB — URINALYSIS, ROUTINE W REFLEX MICROSCOPIC
Bilirubin Urine: NEGATIVE
Hgb urine dipstick: NEGATIVE
Ketones, ur: NEGATIVE
Leukocytes,Ua: NEGATIVE
Nitrite: NEGATIVE
Specific Gravity, Urine: 1.02 (ref 1.000–1.030)
Total Protein, Urine: NEGATIVE
Urine Glucose: NEGATIVE
Urobilinogen, UA: 0.2 (ref 0.0–1.0)
pH: 6.5 (ref 5.0–8.0)

## 2023-11-17 LAB — TSH: TSH: 0.66 u[IU]/mL (ref 0.35–5.50)

## 2023-11-18 LAB — URINE CULTURE
MICRO NUMBER:: 15796565
Result:: NO GROWTH
SPECIMEN QUALITY:: ADEQUATE

## 2023-11-18 NOTE — Progress Notes (Signed)
Thyroid levels back at goal.  She can continue her current medication dose and we can recheck in 6 to 12 months.  Her urine test is normal.

## 2023-11-19 NOTE — Progress Notes (Signed)
Urine culture is negative.  Amanda Mathews. [name], MD 11/19/2023 7:35 AM

## 2023-11-24 ENCOUNTER — Other Ambulatory Visit: Payer: Self-pay | Admitting: Family Medicine

## 2023-12-03 ENCOUNTER — Other Ambulatory Visit: Payer: Self-pay | Admitting: Family Medicine

## 2023-12-21 ENCOUNTER — Other Ambulatory Visit: Payer: Self-pay | Admitting: Family Medicine

## 2024-01-08 DIAGNOSIS — G500 Trigeminal neuralgia: Secondary | ICD-10-CM | POA: Diagnosis not present

## 2024-01-22 ENCOUNTER — Other Ambulatory Visit: Payer: Self-pay | Admitting: Family Medicine

## 2024-01-23 DIAGNOSIS — G500 Trigeminal neuralgia: Secondary | ICD-10-CM | POA: Diagnosis not present

## 2024-01-23 DIAGNOSIS — Z6838 Body mass index (BMI) 38.0-38.9, adult: Secondary | ICD-10-CM | POA: Diagnosis not present

## 2024-02-13 ENCOUNTER — Other Ambulatory Visit: Payer: Self-pay | Admitting: Family Medicine

## 2024-02-20 DIAGNOSIS — Z6838 Body mass index (BMI) 38.0-38.9, adult: Secondary | ICD-10-CM | POA: Diagnosis not present

## 2024-02-20 DIAGNOSIS — G500 Trigeminal neuralgia: Secondary | ICD-10-CM | POA: Diagnosis not present

## 2024-02-21 ENCOUNTER — Other Ambulatory Visit: Payer: Self-pay | Admitting: Family Medicine

## 2024-02-23 DIAGNOSIS — G8929 Other chronic pain: Secondary | ICD-10-CM | POA: Diagnosis not present

## 2024-02-23 DIAGNOSIS — M545 Low back pain, unspecified: Secondary | ICD-10-CM | POA: Diagnosis not present

## 2024-02-23 DIAGNOSIS — M47816 Spondylosis without myelopathy or radiculopathy, lumbar region: Secondary | ICD-10-CM | POA: Diagnosis not present

## 2024-02-24 ENCOUNTER — Telehealth (HOSPITAL_COMMUNITY): Payer: Self-pay

## 2024-02-24 ENCOUNTER — Telehealth: Payer: Self-pay | Admitting: Family Medicine

## 2024-02-24 ENCOUNTER — Other Ambulatory Visit (HOSPITAL_COMMUNITY): Payer: Self-pay

## 2024-02-24 ENCOUNTER — Encounter: Payer: Self-pay | Admitting: Family Medicine

## 2024-02-24 ENCOUNTER — Ambulatory Visit (INDEPENDENT_AMBULATORY_CARE_PROVIDER_SITE_OTHER): Admitting: Family Medicine

## 2024-02-24 ENCOUNTER — Ambulatory Visit (HOSPITAL_BASED_OUTPATIENT_CLINIC_OR_DEPARTMENT_OTHER)
Admission: RE | Admit: 2024-02-24 | Discharge: 2024-02-24 | Disposition: A | Discharge status: Home / Self Care | Source: Ambulatory Visit | Attending: Vascular Surgery | Admitting: Vascular Surgery

## 2024-02-24 ENCOUNTER — Ambulatory Visit (HOSPITAL_COMMUNITY)
Admission: RE | Admit: 2024-02-24 | Discharge: 2024-02-24 | Disposition: A | Discharge status: Home / Self Care | Source: Ambulatory Visit | Attending: Cardiovascular Disease | Admitting: Cardiovascular Disease

## 2024-02-24 VITALS — BP 132/79 | HR 77 | Temp 97.3°F | Ht 60.0 in | Wt 198.6 lb

## 2024-02-24 VITALS — BP 132/79 | HR 77 | Wt 200.6 lb

## 2024-02-24 DIAGNOSIS — M7989 Other specified soft tissue disorders: Secondary | ICD-10-CM | POA: Insufficient documentation

## 2024-02-24 DIAGNOSIS — E039 Hypothyroidism, unspecified: Secondary | ICD-10-CM

## 2024-02-24 DIAGNOSIS — Z7901 Long term (current) use of anticoagulants: Secondary | ICD-10-CM | POA: Insufficient documentation

## 2024-02-24 DIAGNOSIS — I82413 Acute embolism and thrombosis of femoral vein, bilateral: Secondary | ICD-10-CM | POA: Insufficient documentation

## 2024-02-24 DIAGNOSIS — I10 Essential (primary) hypertension: Secondary | ICD-10-CM

## 2024-02-24 DIAGNOSIS — M1990 Unspecified osteoarthritis, unspecified site: Secondary | ICD-10-CM

## 2024-02-24 DIAGNOSIS — R350 Frequency of micturition: Secondary | ICD-10-CM | POA: Diagnosis not present

## 2024-02-24 LAB — URINALYSIS, ROUTINE W REFLEX MICROSCOPIC
Bilirubin Urine: NEGATIVE
Ketones, ur: NEGATIVE
Nitrite: NEGATIVE
Specific Gravity, Urine: 1.015 (ref 1.000–1.030)
Total Protein, Urine: NEGATIVE
Urine Glucose: NEGATIVE
Urobilinogen, UA: 0.2 (ref 0.0–1.0)
pH: 6.5 (ref 5.0–8.0)

## 2024-02-24 LAB — COMPREHENSIVE METABOLIC PANEL
ALT: 11 U/L (ref 0–35)
AST: 15 U/L (ref 0–37)
Albumin: 4.1 g/dL (ref 3.5–5.2)
Alkaline Phosphatase: 115 U/L (ref 39–117)
BUN: 16 mg/dL (ref 6–23)
CO2: 30 meq/L (ref 19–32)
Calcium: 9.2 mg/dL (ref 8.4–10.5)
Chloride: 103 meq/L (ref 96–112)
Creatinine, Ser: 0.96 mg/dL (ref 0.40–1.20)
GFR: 57.78 mL/min — ABNORMAL LOW (ref >60.00)
Glucose, Bld: 105 mg/dL — ABNORMAL HIGH (ref 70–99)
Potassium: 3.6 meq/L (ref 3.5–5.1)
Sodium: 141 meq/L (ref 135–145)
Total Bilirubin: 0.5 mg/dL (ref 0.2–1.2)
Total Protein: 6.4 g/dL (ref 6.0–8.3)

## 2024-02-24 LAB — CBC
HCT: 40.6 % (ref 36.0–46.0)
Hemoglobin: 13.6 g/dL (ref 12.0–15.0)
MCHC: 33.6 g/dL (ref 30.0–36.0)
MCV: 95.7 fl (ref 78.0–100.0)
Platelets: 199 10*3/uL (ref 150.0–400.0)
RBC: 4.24 Mil/uL (ref 3.87–5.11)
RDW: 13.2 % (ref 11.5–15.5)
WBC: 5.9 10*3/uL (ref 4.0–10.5)

## 2024-02-24 LAB — TSH: TSH: 0.91 u[IU]/mL (ref 0.35–5.50)

## 2024-02-24 MED ORDER — ENOXAPARIN SODIUM 150 MG/ML IJ SOSY
150.0000 mg | PREFILLED_SYRINGE | INTRAMUSCULAR | 0 refills | Status: DC
  Filled 2024-02-24: qty 30, 30d supply, fill #0

## 2024-02-24 MED ORDER — WARFARIN SODIUM 5 MG PO TABS
5.0000 mg | ORAL_TABLET | Freq: Every day | ORAL | 0 refills | Status: DC
  Filled 2024-02-24: qty 30, 30d supply, fill #0

## 2024-02-24 MED ORDER — HYDROCHLOROTHIAZIDE 25 MG PO TABS: 12.5000 mg | ORAL_TABLET | Freq: Every day | ORAL | Status: DC

## 2024-02-24 NOTE — Telephone Encounter (Signed)
 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 Pt will be sent to hospital.

## 2024-02-24 NOTE — Assessment & Plan Note (Signed)
 Initially elevated however at goal on recheck.  Continue HCTZ 12.5 mg daily.  Check labs.

## 2024-02-24 NOTE — Assessment & Plan Note (Signed)
 Overall stable.  She is having more pain in her legs which may be related to her osteoarthritis.  Also noted really to her chronic back pain.  We defer further management orthopedics.

## 2024-02-24 NOTE — Patient Instructions (Signed)
 It was very nice to see you today!  We will make sure you do not have a blood clot.  Also check urine sample and blood work today.  Return if symptoms worsen or fail to improve.   Take care, Dr [name]  PLEASE NOTE:  If you had any lab tests, please let us know if you have not heard back within a few days. You may see your results on mychart before we have a chance to review them but we will give you a call once they are reviewed by [place].   If we ordered any referrals today, please let us know if you have not heard from their office within the next week.   If you had any urgent prescriptions sent in today, please check with the pharmacy within an hour of our visit to make sure the prescription was transmitted appropriately.   Please try these tips to maintain a healthy lifestyle:  Eat at least 3 REAL meals and 1-2 snacks per day.  Aim for no more than 5 hours between eating.  If you eat breakfast, please do so within one hour of getting up.   Each meal should contain half fruits/vegetables, one quarter protein, and one quarter carbs (no bigger than a computer mouse)  Cut down on sweet beverages. This includes juice, soda, and sweet tea.   Drink at least 1 glass of water with each meal and aim for at least 8 glasses per day  Exercise at least 150 minutes every week.

## 2024-02-24 NOTE — Patient Instructions (Signed)
-  Take 7.5 mg (one and a half tablets) of warfarin today and tomorrow then take 5 mg (1 tablet) daily. -Inject 1 syringe of Lovenox 150 mg every 24 hours.  -You will need warfarin and Lovenox until your INR is above 2. We will recheck it next on Friday.  -Dr. Darcella Cheshire office should be calling you to schedule an appointment with him. Their number is [phone] and they are located on [address] (off [address] in [place]). -It is important to take your medication around the same time every day.  -Avoid NSAIDs like ibuprofen (Advil, Motrin) and naproxen (Aleve) as well as aspirin doses over 100 mg daily. -Tylenol (acetaminophen) is the preferred over the counter pain medication to lower the risk of bleeding. -Be sure to alert all of your health care providers that you are taking an anticoagulant prior to starting a new medication or having a procedure. -Monitor for signs and symptoms of bleeding (abnormal bruising, prolonged bleeding, nose bleeds, bleeding from gums, discolored urine, black tarry stools). If you have fallen and hit your head OR if your bleeding is severe or not stopping, seek emergency care.  -Go to the emergency room if emergent signs and symptoms of new clot occur (new or worse swelling and pain in an arm or leg, shortness of breath, chest pain, fast or irregular heartbeats, lightheadedness, dizziness, fainting, coughing up blood) or if you experience a significant color change (pale or blue) in the extremity that has the DVT.  -We recommend you wear compression stockings (20-30 mmHg) as long as you are having swelling or pain. Be sure to purchase the correct size and take them off at night.   Your next visit is on Friday March 14th at 2:30pm.  [facility] [address] [place], [place], [place] [address] Enter the hospital through Entrance C off [facility] and pull up to the Heart & Vascular Center entrance to the free valet parking.  Check in for your  appointment at the Heart & Vascular Center.   **As of April 12, 2024, our location is changing** We will be located at [facility] at:  [address], [place], [place] [address] Phone number as of 04/12/24 will be [phone].   If you have any questions or need to reschedule an appointment, please call [phone] [facility] (active number until 04/12/24).  If you are having an emergency, call 911 or present to the nearest emergency room.   What is a DVT?  -Deep vein thrombosis (DVT) is a condition in which a blood clot forms in a vein of the deep venous system which can occur in the lower leg, thigh, pelvis, arm, or neck. This condition is serious and can be life-threatening if the clot travels to the arteries of the lungs and causing a blockage (pulmonary embolism, PE). A DVT can also damage veins in the leg, which can lead to long-term venous disease, leg pain, swelling, discoloration, and ulcers or sores (post-thrombotic syndrome).  -Treatment may include taking an anticoagulant medication to prevent more clots from forming and the current clot from growing, wearing compression stockings, and/or surgical procedures to remove or dissolve the clot.

## 2024-02-24 NOTE — Progress Notes (Signed)
 DVT Clinic Note  Name: Amanda Mathews     MRN: [id number]     DOB: 1946/02/18     Sex: female  PCP: Ardith Dark, MD  Today's Visit: Visit Information: Initial Visit  Referred to DVT Clinic by: Primary Care - Dr. [name] Referred to CPP by: Dr. Randie Heinz Reason for referral:  Chief Complaint  Patient presents with   DVT   HISTORY OF PRESENT ILLNESS: Amanda Mathews is a 78 y.o. female with PMH HTN, HLD, hypothyroidism, osteoporosis, OSA, chronic back pain, trigeminal neuralgia on carbamazepine, who presents after diagnosis of DVT for medication management. Patient was seen by her PCP and reported bilateral lower extremity edema, right worse than left, along with pain for the last month. Ultrasound showed bilateral acute DVTs and she was referred to DVT Clinic to initiate treatment.    Today, patient arrives in good spirits and ambulating well with a walker, accompanied by her twin sister Danella Sensing. No SOB, CP, palpitations. Denies any personal or family history of VTE. Reports onset of RLE edema was 1 month ago. Swelling comes and goes, improves some with elevation, but worsened recently. Denies pain but does endorse some discomfort around her left groin. Patient reports multiple extended trips back and forth to a doctor's appointment (400 mile round trip) every other month. This is a 4 hour trip to [place] (with 1-2 stops) and 4 hours home, with a 2 hour doctor's appointment in between. The last trip to [place] prior to that was about 6 months ago. She is very active during the day, cannot sit still for long. No recent unintentional weight loss. Patient does not think she would be able to give herself injections but her sister lives next door to her and is able to.  Positive Thrombotic Risk Factors: Sedentary journey lasting >8 hours within 4 weeks, Obesity, Older Age Bleeding Risk Factors: Age >65 years  Negative Thrombotic Risk Factors: Previous VTE, Recent surgery (within 3 months), Recent  trauma (within 3 months), Recent admission to hospital with acute illness (within 3 months), Paralysis, paresis, or recent plaster cast immobilization of lower extremity, Central venous catheterization, Bed rest >72 hours within 3 months, Pregnancy, Within 6 weeks postpartum, Recent cesarean section (within 3 months), Estrogen therapy, Testosterone therapy, Erythropoiesis-stimulating agent, Recent COVID diagnosis (within 3 months), Active cancer, Non-malignant, chronic inflammatory condition, Known thrombophilic condition, Smoking  Rx Insurance Coverage: Medicare Rx Affordability: Lovenox is $10/month.  Rx Assistance Provided:  None needed at this time Preferred Pharmacy: Lovenox and warfarin filled today at [facility].  Past Medical History:  Diagnosis Date   Cataract    Osteoporosis    Sleep apnea    Thyroid disease     Past Surgical History:  Procedure Laterality Date   back stimulator      BUNIONECTOMY     CHOLECYSTECTOMY     FEMUR FRACTURE SURGERY     REPLACEMENT TOTAL KNEE BILATERAL Bilateral     Social History   Socioeconomic History   Marital status: Divorced    Spouse name: Not on file   Number of children: Not on file   Years of education: Not on file   Highest education level: Associate degree: occupational, Scientist, product/process development, or vocational program  Occupational History   Occupation: Retired    Occupation: retired    Comment: [employer]  Tobacco Use   Smoking status: Never   Smokeless tobacco: Never  Vaping Use   Vaping status: Never Used  Substance  and Sexual Activity   Alcohol use: Never   Drug use: Never   Sexual activity: Not Currently    Birth control/protection: Abstinence  Other Topics Concern   Not on file  Social History Narrative   Right handed   Live alone 3 stairs to door   Caffeine 2 times daily   Social Drivers of Health   Financial Resource Strain: Low Risk  (11/05/2023)   Overall Financial Resource Strain (CARDIA)    Difficulty of  Paying Living Expenses: Not hard at all  Food Insecurity: No Food Insecurity (11/05/2023)   Hunger Vital Sign    Worried About Running Out of Food in the Last Year: Never true    Ran Out of Food in the Last Year: Never true  Transportation Needs: No Transportation Needs (11/05/2023)   PRAPARE - Administrator, Civil Service (Medical): No    Lack of Transportation (Non-Medical): No  Physical Activity: Insufficiently Active (11/05/2023)   Exercise Vital Sign    Days of Exercise per Week: 1 day    Minutes of Exercise per Session: 60 min  Stress: No Stress Concern Present (11/05/2023)   [employer] of Occupational Health - Occupational Stress Questionnaire    Feeling of Stress : Not at all  Social Connections: Moderately Integrated (11/05/2023)   Social Connection and Isolation Panel [NHANES]    Frequency of Communication with Friends and Family: More than three times a week    Frequency of Social Gatherings with Friends and Family: More than three times a week    Attends Religious Services: More than 4 times per year    Active Member of Clubs or Organizations: Yes    Attends Banker Meetings: 1 to 4 times per year    Marital Status: Divorced  Intimate Partner Violence: Not At Risk (11/05/2023)   Humiliation, Afraid, Rape, and Kick questionnaire    Fear of Current or Ex-Partner: No    Emotionally Abused: No    Physically Abused: No    Sexually Abused: No    Family History  Problem Relation Age of Onset   Hypertension Mother    Stroke Mother    Arthritis Sister    COPD Sister    Depression Sister    Diabetes Sister    Alcohol abuse Brother    Hypertension Sister     Allergies as of 02/24/2024 - Review Complete 02/24/2024  Allergen Reaction Noted   Codeine Nausea And Vomiting and Nausea Only 05/11/2018   Hydrocodone Other (See Comments) 11/05/2023   Hydromorphone Nausea Only and Nausea And Vomiting 05/11/2018   Morphine Nausea Only and Other (See  Comments) 05/11/2018   Oxycodone Other (See Comments) 11/05/2023   Ropinirole  10/06/2020    Current Outpatient Medications on File Prior to Encounter  Medication Sig Dispense Refill   acetaminophen (TYLENOL) 500 MG tablet Take 500 mg by mouth every 6 (six) hours as needed for mild pain.     ammonium lactate (AMLACTIN) 12 % cream APPLY THIN COAT TO AFFECTED AREAS & RUB IN TWICE A DAY IN THE MORNING & IN THE EVENING     Calcium Carb-Cholecalciferol (CALCIUM 600-D PO) Take 600 mg by mouth daily.     carbamazepine (TEGRETOL) 200 MG tablet Take 200 mg by mouth 3 (three) times daily.     Cholecalciferol (VITAMIN D3) 125 MCG (5000 UT) CAPS Take 5,000 Units by mouth daily.     denosumab (PROLIA) 60 MG/ML SOSY injection Inject 60 mg into the skin  every 6 (six) months.     gabapentin (NEURONTIN) 300 MG capsule TAKE 1 CAPSULE BY MOUTH THREE TIMES A DAY (Patient taking differently: Take 300 mg by mouth 2 (two) times daily.) 90 capsule 5   hydrochlorothiazide (HYDRODIURIL) 25 MG tablet Take 0.5 tablets (12.5 mg total) by mouth daily.     hydrocortisone 2.5 % cream Apply topically daily as needed.     levothyroxine (SYNTHROID) 137 MCG tablet Take 1 tablet (137 mcg total) by mouth daily before breakfast. 90 tablet 1   meloxicam (MOBIC) 7.5 MG tablet TAKE 1 TABLET BY MOUTH EVERY DAY 30 tablet 0   methocarbamol (ROBAXIN) 750 MG tablet TAKE 1 TABLET BY MOUTH TWICE A DAY AS NEEDED FOR MUSCLE SPASMS 60 tablet 0   nystatin cream (MYCOSTATIN) nystatin 100,000 unit/gram topical cream     simvastatin (ZOCOR) 40 MG tablet Take 1 tablet (40 mg total) by mouth at bedtime. 90 tablet 3   triamcinolone cream (KENALOG) 0.1 % MIX WITH NYSTATIN AND APPLY A THIN LAYER TO THE AFFECTED AREA TWICE DAILY AS NEEDED     No current facility-administered medications on file prior to encounter.   REVIEW OF SYSTEMS:  Review of Systems  Respiratory:  Negative for shortness of breath.   Cardiovascular:  Positive for leg swelling.  Negative for chest pain and palpitations.  Musculoskeletal:  Positive for myalgias.  Neurological:  Positive for tingling. Negative for dizziness.   PHYSICAL EXAMINATION:  Vitals:   02/24/24 1333  BP: 132/79  Pulse: 77  SpO2: 95%  Weight: 200 lb 9.9 oz (91 kg)    Body mass index is 39.18 kg/m.  Physical Exam Vitals reviewed.  Cardiovascular:     Rate and Rhythm: Normal rate.  Pulmonary:     Effort: Pulmonary effort is normal.  Musculoskeletal:        General: No tenderness.     Right lower leg: Edema present.     Left lower leg: Edema present.  Skin:    Findings: Erythema present. No bruising.  Psychiatric:        Mood and Affect: Mood normal.        Behavior: Behavior normal.        Thought Content: Thought content normal.   Villalta Score for Post-Thrombotic Syndrome: Pain: Moderate Cramps: Moderate Heaviness: Moderate Paresthesia: Moderate Pruritus: Moderate Pretibial Edema: Moderate Skin Induration: Absent Hyperpigmentation: Absent Redness: Mild Venous Ectasia: Absent Pain on calf compression: Absent Villalta Preliminary Score: 13 Is venous ulcer present?: No If venous ulcer is present and score is <15, then 15 points total are assigned: Absent Villalta Total Score: 13  LABS:  CBC     Component Value Date/Time   WBC 4.9 03/20/2023 0822   RBC 4.26 03/20/2023 0822   HGB 13.5 03/20/2023 0822   HCT 39.2 03/20/2023 0822   PLT 186.0 03/20/2023 0822   MCV 91.9 03/20/2023 0822   MCH 30.7 02/19/2022 0605   MCHC 34.4 03/20/2023 0822   RDW 13.6 03/20/2023 0822   LYMPHSABS 0.7 02/19/2022 0605   MONOABS 0.7 02/19/2022 0605   EOSABS 0.1 02/19/2022 0605   BASOSABS 0.1 02/19/2022 0605    Hepatic Function      Component Value Date/Time   PROT 6.0 03/20/2023 0822   ALBUMIN 3.9 03/20/2023 0822   AST 15 03/20/2023 0822   ALT 11 03/20/2023 0822   ALKPHOS 104 03/20/2023 0822   BILITOT 0.5 03/20/2023 1610    Renal Function   Lab Results  Component Value  Date  CREATININE 0.86 03/20/2023   CREATININE 0.98 05/06/2022   CREATININE 0.93 02/19/2022    CrCl cannot be calculated (Patient's most recent lab result is older than the maximum 21 days allowed.).   VVS Vascular Lab Studies:  02/24/24 VAS [place] LOWER EXTREMITY VENOUS (DVT)  Summary:  RIGHT:  - Findings consistent with acute deep vein thrombosis involving the right  common femoral vein, right femoral vein, right popliteal vein, and TPT.  - All other veins visualized appear fully compressible and demonstrate  appropriate Doppler characteristics.    LEFT:  - Findings consistent with acute deep vein thrombosis involving the left  femoral vein, and left popliteal vein.  - All other veins visualized appear fully compressible and demonstrate  appropriate Doppler characteristics.   Right Technical Findings:  Unable to visualize the posterior tibial and peroneal veins due to body  habitus and increase lower leg swelling.   Left Technical Findings:  Unable to visualize the posterior tibial and peroneal veins due to body  habitus and increase lower leg swelling.   ASSESSMENT: Location of DVT: Right common femoral vein, Left femoral vein, Right femoral vein, Left popliteal vein, Right popliteal vein, Right distal vein  Patient without prior history of VTE diagnosed with bilateral DVTs today. Due to involvement of the right common femoral vein and presence of extensive bilateral DVTs, discussed patient with Dr. Randie Heinz. Patient's extended travel (8 hour in car) yesterday is unlikely to be the primary provoking cause, as her RLE edema has been present over one month, and her last travel prior to that was many months ago, but the drive could have contributed to worsening of symptoms. She reports that her LLE edema is at baseline. She is having slight discomfort in both legs but does not describe them as painful. Dr. Randie Heinz recommends starting anticoagulation and he will arrange for her to follow up with  him in the next few weeks to evaluate if she is improving and if any further imaging or workup is needed at that time. This will also help [place] determine duration of anticoagulation.   Due to drug interaction with carbamazepine, DOACs are contraindicated and patient will require warfarin with Lovenox bridge. Labs including CBC and CMET are pending from her PCP's visit this morning. She does not have any history of renal dysfunction or thrombocytopenia. Will initiate Lovenox 1.5 mg/kg every 24 hours (weight 91 kg, rounded dose to 150 mg) due to patient preference for daily dosing to decrease injection burden. Carbamazepine induces warfarin metabolism which decreases warfarin concentrations, but carbamazepine is also an auto-inducer of its own metabolism, which can further decrease INRs, even weeks after starting warfarin. As a result, patient will require very close INR monitoring to maintain INR goal 2-3.   Lovenox and warfarin were both filled at our on site pharmacy during the visit today. No concerns related to medication access or adherence. Counseled patient extensively on warfarin and Lovenox. Educated patient and sister how to administer Lovenox injections, and her sister successfully administered the patient's first dose of Lovenox into her left lower abdomen during the visit. All of their questions have been answered at this time.   PLAN: -Start warfarin 7.5 mg today and tomorrow, then 5 mg daily. -Start enoxaparin (Lovenox) 1.5 mg/kg (150 mg) every 24 hours. -Expected duration of therapy: pending further workup. Therapy started on 02/24/24. -Patient educated on purpose, proper use and potential adverse effects of warfarin (Coumadin) enoxaparin (Lovenox). -Counseled on dietary and drug interactions of warfarin. -Discussed importance of taking  medication around the same time every day. -Advised patient of medications to avoid (NSAIDs, aspirin doses >100 mg daily). -Educated that Tylenol  (acetaminophen) is the preferred analgesic to lower the risk of bleeding. -Advised patient to alert all providers of anticoagulation therapy prior to starting a new medication or having a procedure. -Emphasized importance of monitoring for signs and symptoms of bleeding (abnormal bruising, prolonged bleeding, nose bleeds, bleeding from gums, discolored urine, black tarry stools). -Educated patient to present to the ED if emergent signs and symptoms of new thrombosis occur. -Counseled patient to wear compression stockings daily, removing at night. Elevate legs to help improve swelling.   Follow up: INR check in DVT Clinic on Friday. Dr. Randie Heinz will arrange follow up at his office.   Pervis Hocking, PharmD, [place], CPP Deep [facility]in Thrombosis Clinic Clinical Pharmacist Practitioner

## 2024-02-24 NOTE — Progress Notes (Signed)
 Ultrasound shows DVT.  She has already been seen in the DVT clinic for this.  Will defer further management to them.

## 2024-02-24 NOTE — Telephone Encounter (Signed)
 Noted. Agree with her going to DVT clinic.  Amanda Mathews. [name], MD 02/24/2024 12:57 PM

## 2024-02-24 NOTE — Progress Notes (Signed)
   Amanda Mathews is a 78 y.o. female who presents today for an office visit.  Assessment/Plan:  New/Acute Problems: Leg Swelling  Likely due to venous insufficiency however given unilateral distribution need to check ultrasound to rule out DVT.  Will place stat ultrasound order.  Will also check labs today including CBC, c-Met, and TSH.  If her ultrasound is positive for DVT will need to treat with 3 months of anticoagulation.  If her ultrasound is negative, would consider a few days of Lasix in addition to conservative management including leg elevation, compression, and salt avoidance.  Urinary frequency May be related to OAB.  Will check UA and urine culture today.  Chronic Problems Addressed Today: Essential hypertension Initially elevated however at goal on recheck.  Continue HCTZ 12.5 mg daily.  Check labs.  Osteoarthritis Overall stable.  She is having more pain in her legs which may be related to her osteoarthritis.  Also noted really to her chronic back pain.  We defer further management orthopedics.  Hypothyroidism She is on Synthroid 150 mcg daily.  Check TSH.     Subjective:  HPI:  See Assessment / plan for status of chronic conditions. Her main concern today is swelling in her right leg. This has been going on for about a month or so. Comes and goes. She has had some pain to that leg as well.  She has been traveling back and forth to [place] in the car.  No other recent immobilization.  No recent injuries.  No obvious precipitating events.  No chest pain or shortness of breath.  No orthopnea.  She is also having a little more urinary urgency and frequency.  No dysuria.  No fevers or chills.       Objective:  Physical Exam: BP 132/79   Pulse 77   Temp (!) 97.3 F (36.3 C)   Ht 5' (1.524 m)   Wt 198 lb 9.6 oz (90.1 kg)   SpO2 95%   BMI 38.79 kg/m   Gen: No acute distress, resting comfortably CV: Regular rate and rhythm with no murmurs appreciated Pulm:  Normal work of breathing, clear to auscultation bilaterally with no crackles, wheezes, or rhonchi MUSCULOSKELETAL -3+ edema to right lower extremity.  2+ edema left lower extremity.  Negative Homans' sign bilaterally.  Neurovascular intact distally. Neuro: Grossly normal, moves all extremities Psych: Normal affect and thought content      Amanda Mathews M. [name], MD 02/24/2024 8:51 AM

## 2024-02-24 NOTE — Assessment & Plan Note (Signed)
 She is on Synthroid 150 mcg daily.  Check TSH.

## 2024-02-24 NOTE — Telephone Encounter (Signed)
 Patient Advocate Encounter  Test billing for Lovenox shows copay of $10 for up to 30 day supply (150 mg bid dosing).  Burnell Blanks, CPhT Rx Patient Advocate Phone: [phone]

## 2024-02-25 ENCOUNTER — Encounter: Payer: Self-pay | Admitting: Family Medicine

## 2024-02-25 ENCOUNTER — Other Ambulatory Visit: Payer: Self-pay

## 2024-02-25 DIAGNOSIS — R319 Hematuria, unspecified: Secondary | ICD-10-CM

## 2024-02-25 LAB — URINE CULTURE
MICRO NUMBER:: 16186493
Result:: NO GROWTH
SPECIMEN QUALITY:: ADEQUATE

## 2024-02-25 NOTE — Progress Notes (Signed)
 She had a small amount of blood in her urine.  We are still waiting on her urine culture to come back.  I would like to wait on this before we start any other antibiotics though I would like for her to come back in 1 to 2 weeks to recheck send out urinalysis.  Please place future order for this.  The rest of her labs are all normal.  Please see previous note regarding her ultrasound that was positive for blood clot.

## 2024-02-25 NOTE — Addendum Note (Signed)
 Addended by: Ardith Dark on: 02/25/2024 10:08 AM   Modules accepted: Orders

## 2024-02-25 NOTE — Progress Notes (Signed)
 ur

## 2024-02-26 NOTE — Telephone Encounter (Signed)
 Please call patient to schedule an office visit for elevated temperature with any provider

## 2024-02-26 NOTE — Progress Notes (Signed)
Her urine culture is negative

## 2024-02-27 ENCOUNTER — Ambulatory Visit (INDEPENDENT_AMBULATORY_CARE_PROVIDER_SITE_OTHER): Admitting: Physician Assistant

## 2024-02-27 ENCOUNTER — Ambulatory Visit (HOSPITAL_COMMUNITY)
Admission: RE | Admit: 2024-02-27 | Discharge: 2024-02-27 | Disposition: A | Discharge status: Home / Self Care | Source: Ambulatory Visit | Attending: Vascular Surgery | Admitting: Vascular Surgery

## 2024-02-27 ENCOUNTER — Encounter: Payer: Self-pay | Admitting: Physician Assistant

## 2024-02-27 VITALS — BP 130/80 | HR 68 | Temp 97.7°F | Ht 60.0 in | Wt 197.0 lb

## 2024-02-27 DIAGNOSIS — Z7901 Long term (current) use of anticoagulants: Secondary | ICD-10-CM | POA: Insufficient documentation

## 2024-02-27 DIAGNOSIS — R051 Acute cough: Secondary | ICD-10-CM | POA: Diagnosis not present

## 2024-02-27 DIAGNOSIS — I82413 Acute embolism and thrombosis of femoral vein, bilateral: Secondary | ICD-10-CM | POA: Insufficient documentation

## 2024-02-27 DIAGNOSIS — I10 Essential (primary) hypertension: Secondary | ICD-10-CM

## 2024-02-27 LAB — POCT INR: INR: 1.4 — AB (ref 2.0–3.0)

## 2024-02-27 MED ORDER — BENZONATATE 100 MG PO CAPS: 100.0000 mg | ORAL_CAPSULE | Freq: Three times a day (TID) | ORAL | 1 refills | Status: DC | PRN

## 2024-02-27 MED ORDER — AZITHROMYCIN 250 MG PO TABS: ORAL_TABLET | ORAL | 0 refills | Status: AC

## 2024-02-27 NOTE — Progress Notes (Signed)
 Amanda Mathews is a 78 y.o. female here for a new problem.  History of Present Illness:   Chief Complaint  Patient presents with   Cough    Pt c/o dry cough and getting worse, nasal congestion.    Hypertension    Pt was told to come back for a recheck.    HPI  Cough: Pt complains of dry cough and nasal congestion starting a 2-3 days ago.  Also had a sore throat but states it has improved since onset of sx.  States her dry cough has worsened this morning.  Has tried Tylenol and Vicks Vapor rub to help her sx.  States that Zpak tends to help whenever she is sick.  Denies any fever, chills, or ear pain.  No known recent exposure to any illnesses.    Hypertension: She is on HCTZ 12.5 mg once daily.  Good compliance and tolerance reported.  Has not been monitoring BP at home though she does have a cuff at home.  Reports that her BP was in the 120s/70s at office visit recently.  Denies any chest pain or shortness of breath.    Past Medical History:  Diagnosis Date   Cataract    Osteoporosis    Sleep apnea    Thyroid disease      Social History   Tobacco Use   Smoking status: Never   Smokeless tobacco: Never  Vaping Use   Vaping status: Never Used  Substance Use Topics   Alcohol use: Never   Drug use: Never    Past Surgical History:  Procedure Laterality Date   back stimulator      BUNIONECTOMY     CHOLECYSTECTOMY     FEMUR FRACTURE SURGERY     REPLACEMENT TOTAL KNEE BILATERAL Bilateral     Family History  Problem Relation Age of Onset   Hypertension Mother    Stroke Mother    Arthritis Sister    COPD Sister    Depression Sister    Diabetes Sister    Alcohol abuse Brother    Hypertension Sister     Allergies  Allergen Reactions   Codeine Nausea And Vomiting and Nausea Only    Other reaction(s): vomiting   Hydrocodone Other (See Comments)    Hallucinate    Hydromorphone Nausea Only and Nausea And Vomiting   Morphine Nausea Only and Other (See  Comments)    Hallucinations  Other reaction(s): Delusions (intolerance), hallucinations, Other (See Comments) Hallucinations     Oxycodone Other (See Comments)    Sick and nervous   Ropinirole     hallucinations Other reaction(s): Delusions (intolerance) hallucinations    Current Medications:   Current Outpatient Medications:    acetaminophen (TYLENOL) 500 MG tablet, Take 500 mg by mouth every 6 (six) hours as needed for mild pain., Disp: , Rfl:    ammonium lactate (AMLACTIN) 12 % cream, APPLY THIN COAT TO AFFECTED AREAS & RUB IN TWICE A DAY IN THE MORNING & IN THE EVENING, Disp: , Rfl:    azithromycin (ZITHROMAX) 250 MG tablet, Take 2 tablets on day 1, then 1 tablet daily on days 2 through 5, Disp: 6 tablet, Rfl: 0   benzonatate (TESSALON PERLES) 100 MG capsule, Take 1 capsule (100 mg total) by mouth 3 (three) times daily as needed., Disp: 30 capsule, Rfl: 1   Calcium Carb-Cholecalciferol (CALCIUM 600-D PO), Take 600 mg by mouth daily., Disp: , Rfl:    carbamazepine (TEGRETOL) 200 MG tablet, Take 200 mg  by mouth 3 (three) times daily., Disp: , Rfl:    Cholecalciferol (VITAMIN D3) 125 MCG (5000 UT) CAPS, Take 5,000 Units by mouth daily., Disp: , Rfl:    denosumab (PROLIA) 60 MG/ML SOSY injection, Inject 60 mg into the skin every 6 (six) months., Disp: , Rfl:    enoxaparin (LOVENOX) 150 MG/ML injection, Inject 1 mL (150 mg total) into the skin daily as directed by [facility] Clinic., Disp: 30 mL, Rfl: 0   gabapentin (NEURONTIN) 300 MG capsule, TAKE 1 CAPSULE BY MOUTH THREE TIMES A DAY (Patient taking differently: Take 300 mg by mouth 2 (two) times daily.), Disp: 90 capsule, Rfl: 5   hydrochlorothiazide (HYDRODIURIL) 25 MG tablet, Take 0.5 tablets (12.5 mg total) by mouth daily., Disp: , Rfl:    hydrocortisone 2.5 % cream, Apply topically daily as needed., Disp: , Rfl:    levothyroxine (SYNTHROID) 137 MCG tablet, Take 1 tablet (137 mcg total) by mouth daily before breakfast., Disp: 90 tablet, Rfl:  1   meloxicam (MOBIC) 7.5 MG tablet, TAKE 1 TABLET BY MOUTH EVERY DAY, Disp: 30 tablet, Rfl: 0   methocarbamol (ROBAXIN) 750 MG tablet, TAKE 1 TABLET BY MOUTH TWICE A DAY AS NEEDED FOR MUSCLE SPASMS, Disp: 60 tablet, Rfl: 0   nystatin cream (MYCOSTATIN), nystatin 100,000 unit/gram topical cream, Disp: , Rfl:    simvastatin (ZOCOR) 40 MG tablet, Take 1 tablet (40 mg total) by mouth at bedtime., Disp: 90 tablet, Rfl: 3   triamcinolone cream (KENALOG) 0.1 %, MIX WITH NYSTATIN AND APPLY A THIN LAYER TO THE AFFECTED AREA TWICE DAILY AS NEEDED, Disp: , Rfl:    warfarin (COUMADIN) 5 MG tablet, Take 1 tablet (5 mg total) by mouth daily. Or as directed by [facility]., Disp: 30 tablet, Rfl: 0   Review of Systems:   Negative unless otherwise specified per HPI.  Vitals:   Vitals:   02/27/24 0844 02/27/24 0903  BP: (!) 150/80 130/80  Pulse: 68   Temp: 97.7 F (36.5 C)   TempSrc: Temporal   SpO2: 95%   Weight: 197 lb (89.4 kg)   Height: 5' (1.524 m)      Body mass index is 38.47 kg/m.  Physical Exam:   Physical Exam Vitals and nursing note reviewed.  Constitutional:      General: She is not in acute distress.    Appearance: She is well-developed. She is not ill-appearing or toxic-appearing.  Cardiovascular:     Rate and Rhythm: Normal rate and regular rhythm.     Pulses: Normal pulses.     Heart sounds: Normal heart sounds, S1 normal and S2 normal.  Pulmonary:     Effort: Pulmonary effort is normal.     Breath sounds: Normal breath sounds.  Skin:    General: Skin is warm and dry.  Neurological:     Mental Status: She is alert.     GCS: GCS eye subscore is 4. GCS verbal subscore is 5. GCS motor subscore is 6.  Psychiatric:        Speech: Speech normal.        Behavior: Behavior normal. Behavior is cooperative.     Assessment and Plan:   1. Acute cough (Primary) No red flags on exam.   Will initiate azithromycin and tessalon perles per orders.  I did advise her to start   Discussed taking medications as prescribed.  Reviewed return precautions including new or worsening fever, SOB, new or worsening cough or other concerns.  Push fluids and  rest.  I recommend that patient follow-up if symptoms worsen or persist despite treatment x 7-10 days, sooner if needed.  2. Essential hypertension Above goal today at first but then normal on recheck Continue hydrochloroTHIAZIDE 25 mg daily If home monitoring shows consistent elevation, or any symptom(s) develop, recommend reach out to [place] for further advice on next steps  I, Isabelle Course, acting as a Neurosurgeon for Jarold Motto, [place]., have documented all relevant documentation on the behalf of Jarold Motto, [place], as directed by  Jarold Motto, PA while in the presence of Jarold Motto, [place].  I, Jarold Motto, [place], have reviewed all documentation for this visit. The documentation on 02/27/24 for the exam, diagnosis, procedures, and orders are all accurate and complete.  Jarold Motto, PA-C

## 2024-02-27 NOTE — Progress Notes (Signed)
 [facility] Clinic Note  Name: Amanda Mathews     MRN: [id number]     DOB: [birth year]     Sex: female  PCP: Ardith Dark, MD  Today's Visit: Visit Information: Follow Up Visit  Referred to DVT Clinic by: Primary Care - Dr. [name] Referred to CPP by: Dr. Randie Heinz Reason for referral:  Chief Complaint  Patient presents with   Warfarin monitoring   HISTORY OF PRESENT ILLNESS: Amanda Mathews is a 78 y.o. female with PMH HTN, HLD, hypothyroidism, osteoporosis, OSA, chronic back pain, trigeminal neuralgia on carbamazepine, who presents after diagnosis of DVT for medication management. Patient was seen by her PCP and reported bilateral lower extremity edema, right worse than left, along with pain for the prior month. Ultrasound showed bilateral acute DVTs and she was referred to DVT Clinic to initiate treatment. No prior personal or family history of VTE. Initially seen in DVT Clinic 02/24/24, initiated anticoagulation with warfarin and Lovenox bridge due to drug interaction with DOACs and carbamazepine. Dr. 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   Today, patient arrives in good spirits and is ambulating well with a walker. Continues to have no SOB, CP, palpitations. She reports that her pain and swelling has started to improve with elevation. She still needs to purchase compression stockings. Denies abnormal bleeding or bruising other than some bruising around Lovenox injection sites. Denies missed doses of warfarin or Lovenox. She was seen today for a new sore throat and cough at her PCP's office, started on azithromycin.   Positive Thrombotic Risk Factors: Sedentary journey lasting >8 hours within 4 weeks, Obesity, Older Age Bleeding Risk Factors: Age >65 years, Anticoagulant therapy  Negative Thrombotic Risk Factors: Previous VTE, Recent surgery (within 3 months), Recent trauma (within 3 months), Recent  admission to hospital with acute illness (within 3 months), Paralysis, paresis, or recent plaster cast immobilization of lower extremity, Central venous catheterization, Bed rest >72 hours within 3 months, Pregnancy, Within 6 weeks postpartum, Recent cesarean section (within 3 months), Estrogen therapy, Testosterone therapy, Erythropoiesis-stimulating agent, Recent COVID diagnosis (within 3 months), Active cancer, Non-malignant, chronic inflammatory condition, Known thrombophilic condition, Smoking  Rx Insurance Coverage: Medicare Rx Affordability: Lovenox is $10/month. Warfarin is $4/month. Rx Assistance Provided:  None needed at this time Preferred Pharmacy: Lovenox and warfarin filled today at [facility].  Past Medical History:  Diagnosis Date   Cataract    Osteoporosis    Sleep apnea    Thyroid disease     Past Surgical History:  Procedure Laterality Date   back stimulator      BUNIONECTOMY     CHOLECYSTECTOMY     FEMUR FRACTURE SURGERY     REPLACEMENT TOTAL KNEE BILATERAL Bilateral     Social History   Socioeconomic History   Marital status: Divorced    Spouse name: Not on file   Number of children: Not on file   Years of education: Not on file   Highest education level: Associate degree: occupational, Scientist, product/process development, or vocational program  Occupational History   Occupation: Retired    Occupation: retired    Comment: [employer]  Tobacco Use   Smoking status: Never   Smokeless tobacco: Never  Vaping Use   Vaping status: Never Used  Substance and Sexual Activity   Alcohol use: Never   Drug use: Never   Sexual activity: Not Currently    Birth  control/protection: Abstinence  Other Topics Concern   Not on file  Social History Narrative   Right handed   Live alone 3 stairs to door   Caffeine 2 times daily   Social Drivers of Health   Financial Resource Strain: Low Risk  (11/05/2023)   Overall Financial Resource Strain (CARDIA)    Difficulty of Paying Living  Expenses: Not hard at all  Food Insecurity: No Food Insecurity (11/05/2023)   Hunger Vital Sign    Worried About Running Out of Food in the Last Year: Never true    Ran Out of Food in the Last Year: Never true  Transportation Needs: No Transportation Needs (11/05/2023)   PRAPARE - Administrator, Civil Service (Medical): No    Lack of Transportation (Non-Medical): No  Physical Activity: Insufficiently Active (11/05/2023)   Exercise Vital Sign    Days of Exercise per Week: 1 day    Minutes of Exercise per Session: 60 min  Stress: No Stress Concern Present (11/05/2023)   [employer] of Occupational Health - Occupational Stress Questionnaire    Feeling of Stress : Not at all  Social Connections: Moderately Integrated (11/05/2023)   Social Connection and Isolation Panel [NHANES]    Frequency of Communication with Friends and Family: More than three times a week    Frequency of Social Gatherings with Friends and Family: More than three times a week    Attends Religious Services: More than 4 times per year    Active Member of Clubs or Organizations: Yes    Attends Banker Meetings: 1 to 4 times per year    Marital Status: Divorced  Intimate Partner Violence: Not At Risk (11/05/2023)   Humiliation, Afraid, Rape, and Kick questionnaire    Fear of Current or Ex-Partner: No    Emotionally Abused: No    Physically Abused: No    Sexually Abused: No    Family History  Problem Relation Age of Onset   Hypertension Mother    Stroke Mother    Arthritis Sister    COPD Sister    Depression Sister    Diabetes Sister    Alcohol abuse Brother    Hypertension Sister     Allergies as of 02/27/2024 - Review Complete 02/27/2024  Allergen Reaction Noted   Codeine Nausea And Vomiting and Nausea Only 05/11/2018   Hydrocodone Other (See Comments) 11/05/2023   Hydromorphone Nausea Only and Nausea And Vomiting 05/11/2018   Morphine Nausea Only and Other (See Comments)  05/11/2018   Oxycodone Other (See Comments) 11/05/2023   Ropinirole  10/06/2020    Current Outpatient Medications on File Prior to Encounter  Medication Sig Dispense Refill   enoxaparin (LOVENOX) 150 MG/ML injection Inject 1 mL (150 mg total) into the skin daily as directed by [facility] Clinic. 30 mL 0   warfarin (COUMADIN) 5 MG tablet Take 1 tablet (5 mg total) by mouth daily. Or as directed by [facility] Clinic. 30 tablet 0   acetaminophen (TYLENOL) 500 MG tablet Take 500 mg by mouth every 6 (six) hours as needed for mild pain.     ammonium lactate (AMLACTIN) 12 % cream APPLY THIN COAT TO AFFECTED AREAS & RUB IN TWICE A DAY IN THE MORNING & IN THE EVENING     Calcium Carb-Cholecalciferol (CALCIUM 600-D PO) Take 600 mg by mouth daily.     carbamazepine (TEGRETOL) 200 MG tablet Take 200 mg by mouth 3 (three) times daily.     Cholecalciferol (VITAMIN D3)  125 MCG (5000 UT) CAPS Take 5,000 Units by mouth daily.     denosumab (PROLIA) 60 MG/ML SOSY injection Inject 60 mg into the skin every 6 (six) months.     gabapentin (NEURONTIN) 300 MG capsule TAKE 1 CAPSULE BY MOUTH THREE TIMES A DAY (Patient taking differently: Take 300 mg by mouth 2 (two) times daily.) 90 capsule 5   hydrochlorothiazide (HYDRODIURIL) 25 MG tablet Take 0.5 tablets (12.5 mg total) by mouth daily.     hydrocortisone 2.5 % cream Apply topically daily as needed.     levothyroxine (SYNTHROID) 137 MCG tablet Take 1 tablet (137 mcg total) by mouth daily before breakfast. 90 tablet 1   meloxicam (MOBIC) 7.5 MG tablet TAKE 1 TABLET BY MOUTH EVERY DAY 30 tablet 0   methocarbamol (ROBAXIN) 750 MG tablet TAKE 1 TABLET BY MOUTH TWICE A DAY AS NEEDED FOR MUSCLE SPASMS 60 tablet 0   nystatin cream (MYCOSTATIN) nystatin 100,000 unit/gram topical cream     simvastatin (ZOCOR) 40 MG tablet Take 1 tablet (40 mg total) by mouth at bedtime. 90 tablet 3   triamcinolone cream (KENALOG) 0.1 % MIX WITH NYSTATIN AND APPLY A THIN LAYER TO THE AFFECTED AREA TWICE  DAILY AS NEEDED     No current facility-administered medications on file prior to encounter.   REVIEW OF SYSTEMS:  Review of Systems  Respiratory:  Negative for shortness of breath.   Cardiovascular:  Positive for leg swelling. Negative for chest pain and palpitations.  Musculoskeletal:  Positive for myalgias.  Neurological:  Positive for tingling. Negative for dizziness.   PHYSICAL EXAMINATION:  There were no vitals filed for this visit.  Physical Exam Vitals reviewed.  Cardiovascular:     Rate and Rhythm: Normal rate.  Pulmonary:     Effort: Pulmonary effort is normal.  Musculoskeletal:        General: No tenderness.     Right lower leg: Edema present.     Left lower leg: Edema present.  Skin:    Findings: Erythema present. No bruising.  Psychiatric:        Mood and Affect: Mood normal.        Behavior: Behavior normal.        Thought Content: Thought content normal.   Villalta Score for Post-Thrombotic Syndrome: Pain: Moderate Cramps: Mild Heaviness: Moderate Paresthesia: Mild Pruritus: Absent Pretibial Edema: Moderate Skin Induration: Absent Hyperpigmentation: Absent Redness: Mild Venous Ectasia: Absent Pain on calf compression: Absent Villalta Preliminary Score: 9 Is venous ulcer present?: No If venous ulcer is present and score is <15, then 15 points total are assigned: Absent Villalta Total Score: 9  LABS:  CBC     Component Value Date/Time   WBC 5.9 02/24/2024 0906   RBC 4.24 02/24/2024 0906   HGB 13.6 02/24/2024 0906   HCT 40.6 02/24/2024 0906   PLT 199.0 02/24/2024 0906   MCV 95.7 02/24/2024 0906   MCH 30.7 02/19/2022 0605   MCHC 33.6 02/24/2024 0906   RDW 13.2 02/24/2024 0906   LYMPHSABS 0.7 02/19/2022 0605   MONOABS 0.7 02/19/2022 0605   EOSABS 0.1 02/19/2022 0605   BASOSABS 0.1 02/19/2022 0605    Hepatic Function      Component Value Date/Time   PROT 6.4 02/24/2024 0906   ALBUMIN 4.1 02/24/2024 0906   AST 15 02/24/2024 0906   ALT 11  02/24/2024 0906   ALKPHOS 115 02/24/2024 0906   BILITOT 0.5 02/24/2024 0906    Renal Function   Lab Results  Component Value Date   CREATININE 0.96 02/24/2024   CREATININE 0.86 03/20/2023   CREATININE 0.98 05/06/2022    Estimated Creatinine Clearance: 50.4 mL/min (by C-G formula based on SCr of 0.96 mg/dL).   [facility] Vascular Lab Studies:  02/24/24 VAS [place] LOWER EXTREMITY VENOUS (DVT)  Summary:  RIGHT:  - Findings consistent with acute deep vein thrombosis involving the right  common femoral vein, right femoral vein, right popliteal vein, and TPT.  - All other veins visualized appear fully compressible and demonstrate  appropriate Doppler characteristics.    LEFT:  - Findings consistent with acute deep vein thrombosis involving the left  femoral vein, and left popliteal vein.  - All other veins visualized appear fully compressible and demonstrate  appropriate Doppler characteristics.   Right Technical Findings:  Unable to visualize the posterior tibial and peroneal veins due to body  habitus and increase lower leg swelling.   Left Technical Findings:  Unable to visualize the posterior tibial and peroneal veins due to body  habitus and increase lower leg swelling.   ASSESSMENT: Location of DVT: Right common femoral vein, Left femoral vein, Right femoral vein, Left popliteal vein, Right popliteal vein, Right distal vein  Patient without prior history of VTE diagnosed with bilateral DVTs 02/24/24. Due to involvement of the right common femoral vein and presence of extensive bilateral DVTs, discussed patient with Dr. Randie Heinz during her initial DVT Clinic visit. Patient's extended travel (8 hour in car) on 02/23/24 is unlikely to be the primary provoking cause, as her RLE edema has been present over one month, and her last travel prior to that was many months ago, but the drive could have contributed to worsening of symptoms. Per patient, LLE edema is at baseline. She has some discomfort in  both legs but does not describe them as painful. Dr. Randie Heinz recommended starting anticoagulation and he would arrange for her to follow up with him in the next few weeks to evaluate if she is improving and if any further imaging or workup is needed at that time. This will also help [place] determine duration of anticoagulation.   Due to drug interaction with carbamazepine, DOACs are contraindicated and patient requires anticoagulation with warfarin with Lovenox bridge. CBC WNL on 02/24/24. Using Lovenox 1.5 mg/kg every 24 hours (weight 91 kg, rounded dose to 150 mg) due to patient preference for daily dosing to decrease injection burden. Carbamazepine induces warfarin metabolism which decreases warfarin concentrations, but carbamazepine is also an auto-inducer of its own metabolism, which can further decrease INRs, even weeks after starting warfarin. As a result, patient will require very close INR monitoring to maintain INR goal 2-3.   INR today is 1.4, which is below goal 2-3 but with only having had warfarin for 3 days so far it is moving in the right direction. Since she is starting azithromycin today for 5 days, will have her take only 5 mg daily until her next appointment since azithromycin can increase warfarin/INR. Continue Lovenox bridge until INR >2.   PLAN: -Start warfarin 7.5 mg today and tomorrow, then 5 mg daily. -Continue enoxaparin (Lovenox) 1.5 mg/kg (150 mg) every 24 hours. -Expected duration of therapy: pending further workup. Therapy started on 02/24/24. -Patient educated on purpose, proper use and potential adverse effects of warfarin (Coumadin) enoxaparin (Lovenox). -Counseled on dietary and drug interactions of warfarin. -Discussed importance of taking medication around the same time every day. -Advised patient of medications to avoid (NSAIDs, aspirin doses >100 mg daily). -Educated that Tylenol (acetaminophen)  is the preferred analgesic to lower the risk of bleeding. -Advised patient  to alert all providers of anticoagulation therapy prior to starting a new medication or having a procedure. -Emphasized importance of monitoring for signs and symptoms of bleeding (abnormal bruising, prolonged bleeding, nose bleeds, bleeding from gums, discolored urine, black tarry stools). -Educated patient to present to the ED if emergent signs and symptoms of new thrombosis occur. -Counseled patient to wear compression stockings daily, removing at night. Elevate legs to help improve swelling.   Follow up: INR check in DVT Clinic on Tuesday. Appointment with Dr. Randie Heinz on 03/17/24.  Pervis Hocking, PharmD, [place], CPP [facility] Clinical Pharmacist Practitioner

## 2024-02-27 NOTE — Patient Instructions (Signed)
 It was great to see you!  Start azithromycin for your cough/congestion I have also sent in tessalon Perles for your cough  Your blood pressure is elevated in our office today.  I recommend that you monitor this at home.  Your goal blood pressure should be around < 130/80, unless you are over 76 years old, your goal may be closer to 140-150/90. Please note if you have been given other goals from a cardiologist or other healthcare provider, please defer to their recommendations.  When preparing to take your blood pressure: Plan ahead. Don't smoke, drink caffeine or exercise within 30 minutes before taking your blood pressure. Empty your bladder. Don't take the measurement over clothes. Remove the clothing over the arm that will be used to measure blood pressure. You can use either arm unless otherwise told by a healthcare provider. Usually there is not a big difference between readings on them. Be still. Allow at least five minutes of quiet rest before measurements. Don't talk or use the phone. Sit correctly. Sit with your back straight and supported (on a dining chair, rather than a sofa). Your feet should be flat on the floor. Do not cross your legs. Support your arm on a flat surface. The middle of the cuff should be placed on the upper arm at heart level.  Measure at the same time of the day. Take multiple readings and record the results. Each time you measure, take two readings one minute apart. Record the results and bring in to your next office visit.  In order to know how well the medication is working, I would like you to take your readings 1-2 hours after taking your blood pressure medication if possible. Take your blood pressure measurements and record 2-3 days per week.  If you get a high blood pressure reading: A single high reading is not an immediate cause for alarm. If you get a reading that is higher than normal, take your blood pressure a second time. Write down the results of  both measurements. Check with your health care professional to see if there's a health concern or whether there may be problems with your monitor. If your blood pressure readings are suddenly higher than 180/120 mm Hg, wait at least one minute and test again. If your readings are still very high, contact your health care professional immediately. You could be having a hypertensive crisis. Call 911 if your blood pressure is higher than 180/120 mm Hg and if you are having new signs or symptoms that may include: Chest pain Shortness of breath Back pain Numbness Weakness Change in vision Difficulty speaking Confusion Dizziness Vomiting   Take care,  Jarold Motto PA-C

## 2024-02-27 NOTE — Patient Instructions (Signed)
-  Take 5 mg (1 tablet) of warfarin daily. Continue Lovenox 1 syringe daily.  -We recommend you wear compression stockings (20-30 mmHg) as long as you are having swelling or pain. Be sure to purchase the correct size and take them off at night.  -It is important to take your medication around the same time every day.  -Avoid NSAIDs like ibuprofen (Advil, Motrin) and naproxen (Aleve) as well as aspirin doses over 100 mg daily. -Tylenol (acetaminophen) is the preferred over the counter pain medication to lower the risk of bleeding. -Be sure to alert all of your health care providers that you are taking an anticoagulant prior to starting a new medication or having a procedure. -Monitor for signs and symptoms of bleeding (abnormal bruising, prolonged bleeding, nose bleeds, bleeding from gums, discolored urine, black tarry stools). If you have fallen and hit your head OR if your bleeding is severe or not stopping, seek emergency care.  -Go to the emergency room if emergent signs and symptoms of new clot occur (new or worse swelling and pain in an arm or leg, shortness of breath, chest pain, fast or irregular heartbeats, lightheadedness, dizziness, fainting, coughing up blood) or if you experience a significant color change (pale or blue) in the extremity that has the DVT.   Your next visit is on Tuesday March 18th at 9:30am.  [facility] [address] [place], [place], [place] [address] Enter the hospital through Entrance C off [facility] and pull up to the Heart & Vascular Center entrance to the free valet parking.  Check in for your appointment at the Heart & Vascular Center.   **As of April 12, 2024, our location is changing** We will be located at [facility] at:  [address], [place], [place] [address] Phone number as of 04/12/24 will be [phone].   If you have any questions or need to reschedule an appointment, please call  [phone] [facility] (active number until 04/12/24).  If you are having an emergency, call 911 or present to the nearest emergency room.   What is a DVT?  -Deep vein thrombosis (DVT) is a condition in which a blood clot forms in a vein of the deep venous system which can occur in the lower leg, thigh, pelvis, arm, or neck. This condition is serious and can be life-threatening if the clot travels to the arteries of the lungs and causing a blockage (pulmonary embolism, PE). A DVT can also damage veins in the leg, which can lead to long-term venous disease, leg pain, swelling, discoloration, and ulcers or sores (post-thrombotic syndrome).  -Treatment may include taking an anticoagulant medication to prevent more clots from forming and the current clot from growing, wearing compression stockings, and/or surgical procedures to remove or dissolve the clot.

## 2024-03-01 ENCOUNTER — Other Ambulatory Visit: Payer: Self-pay | Admitting: Family Medicine

## 2024-03-01 ENCOUNTER — Other Ambulatory Visit: Payer: Self-pay

## 2024-03-01 DIAGNOSIS — I82413 Acute embolism and thrombosis of femoral vein, bilateral: Secondary | ICD-10-CM

## 2024-03-02 ENCOUNTER — Ambulatory Visit (HOSPITAL_COMMUNITY)
Admission: RE | Admit: 2024-03-02 | Discharge: 2024-03-02 | Disposition: A | Discharge status: Home / Self Care | Source: Ambulatory Visit | Attending: Vascular Surgery | Admitting: Vascular Surgery

## 2024-03-02 DIAGNOSIS — I82413 Acute embolism and thrombosis of femoral vein, bilateral: Secondary | ICD-10-CM | POA: Insufficient documentation

## 2024-03-02 DIAGNOSIS — Z7901 Long term (current) use of anticoagulants: Secondary | ICD-10-CM | POA: Insufficient documentation

## 2024-03-02 LAB — POCT INR: INR: 2 (ref 2.0–3.0)

## 2024-03-02 NOTE — Patient Instructions (Signed)
 Take 7.5 mg (1 and a half tablets) on Tuesday and Friday and 5 mg (1 tablet) of warfarin all other days. Stop Lovenox.   -It is important to take your medication around the same time every day.  -Avoid NSAIDs like ibuprofen (Advil, Motrin) and naproxen (Aleve) as well as aspirin doses over 100 mg daily. -Tylenol (acetaminophen) is the preferred over the counter pain medication to lower the risk of bleeding. -Be sure to alert all of your health care providers that you are taking an anticoagulant prior to starting a new medication or having a procedure. -Monitor for signs and symptoms of bleeding (abnormal bruising, prolonged bleeding, nose bleeds, bleeding from gums, discolored urine, black tarry stools). If you have fallen and hit your head OR if your bleeding is severe or not stopping, seek emergency care.  -Go to the emergency room if emergent signs and symptoms of new clot occur (new or worse swelling and pain in an arm or leg, shortness of breath, chest pain, fast or irregular heartbeats, lightheadedness, dizziness, fainting, coughing up blood) or if you experience a significant color change (pale or blue) in the extremity that has the DVT.  -We recommend you wear compression stockings (20-30 mmHg) as long as you are having swelling or pain. Be sure to purchase the correct size and take them off at night.   Your next visit is on Tuesday March 25th at 9:30am.  [facility] [address] [place], [place], [place] [address] Enter the hospital through Entrance C off [facility] and pull up to the Heart & Vascular Center entrance to the free valet parking.  Check in for your appointment at the Heart & Vascular Center.   **As of April 12, 2024, our location is changing** We will be located at [facility] at:  [address], [address], [place], [place] [address] Phone number as of 04/12/24 will be [phone].   If you have any questions or need to  reschedule an appointment, please call [phone] [facility] (active number until 04/12/24).  If you are having an emergency, call 911 or present to the nearest emergency room.   What is a DVT?  -Deep vein thrombosis (DVT) is a condition in which a blood clot forms in a vein of the deep venous system which can occur in the lower leg, thigh, pelvis, arm, or neck. This condition is serious and can be life-threatening if the clot travels to the arteries of the lungs and causing a blockage (pulmonary embolism, PE). A DVT can also damage veins in the leg, which can lead to long-term venous disease, leg pain, swelling, discoloration, and ulcers or sores (post-thrombotic syndrome).  -Treatment may include taking an anticoagulant medication to prevent more clots from forming and the current clot from growing, wearing compression stockings, and/or surgical procedures to remove or dissolve the clot.

## 2024-03-02 NOTE — Progress Notes (Signed)
 DVT Clinic Note  Name: Amanda Mathews     MRN: [id number]     DOB: 01-Nov-1946     Sex: female  PCP: Ardith Dark, MD  Today's Visit: Visit Information: Follow Up Visit  Referred to DVT Clinic by: Primary Care - Dr. [name] Referred to CPP by: Dr. Randie Heinz Reason for referral:  Chief Complaint  Patient presents with   Warfarin management   HISTORY OF PRESENT ILLNESS: Amanda Mathews is a 78 y.o. female with PMH HTN, HLD, hypothyroidism, osteoporosis, OSA, chronic back pain, trigeminal neuralgia on carbamazepine, who presents after diagnosis of DVT for medication management. Patient was seen by her PCP and reported bilateral lower extremity edema, right worse than left, along with pain for the prior month. Ultrasound showed bilateral acute DVTs and she was referred to DVT Clinic to initiate treatment. No prior personal or family history of VTE. Initially seen in DVT Clinic 02/24/24, initiated anticoagulation with warfarin and Lovenox bridge due to drug interaction with DOACs and carbamazepine. Dr. 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 She is following in DVT Clinic for INR management.  Today, patient arrives in good spirits and is ambulating well with a walker. Continues to have no SOB, CP, palpitations. She reports that her pain and swelling have started to improve with elevation. She has purchased compression stockings. It took her a few tries to find some that fit both of her legs, as she currently needs two different sizes. Denies abnormal bleeding or bruising other than some bruising around Lovenox injection sites. Denies missed doses of warfarin or Lovenox. Today was her last day taking azithromycin.   Positive Thrombotic Risk Factors: Obesity, Older Age, Sedentary journey lasting >8 hours within 4 weeks (long car ride occurred after sympton onset) Bleeding Risk Factors: Age >65 years,  Anticoagulant therapy  Negative Thrombotic Risk Factors: Previous VTE, Recent surgery (within 3 months), Recent trauma (within 3 months), Recent admission to hospital with acute illness (within 3 months), Paralysis, paresis, or recent plaster cast immobilization of lower extremity, Central venous catheterization, Bed rest >72 hours within 3 months, Pregnancy, Within 6 weeks postpartum, Recent cesarean section (within 3 months), Estrogen therapy, Testosterone therapy, Erythropoiesis-stimulating agent, Recent COVID diagnosis (within 3 months), Active cancer, Non-malignant, chronic inflammatory condition, Known thrombophilic condition, Smoking  Rx Insurance Coverage: Medicare Rx Affordability: Lovenox is $10/month. Warfarin is $4/month. Rx Assistance Provided:  None needed at this time Preferred Pharmacy: Lovenox and warfarin filled at [facility] during initial DVT visit.  Past Medical History:  Diagnosis Date   Cataract    Osteoporosis    Sleep apnea    Thyroid disease     Past Surgical History:  Procedure Laterality Date   back stimulator      BUNIONECTOMY     CHOLECYSTECTOMY     FEMUR FRACTURE SURGERY     REPLACEMENT TOTAL KNEE BILATERAL Bilateral     Social History   Socioeconomic History   Marital status: Divorced    Spouse name: Not on file   Number of children: Not on file   Years of education: Not on file   Highest education level: Associate degree: occupational, Scientist, product/process development, or vocational program  Occupational History   Occupation: Retired    Occupation: retired    Comment: [employer]  Tobacco Use   Smoking status: Never   Smokeless tobacco: Never  Vaping Use   Vaping status:  Never Used  Substance and Sexual Activity   Alcohol use: Never   Drug use: Never   Sexual activity: Not Currently    Birth control/protection: Abstinence  Other Topics Concern   Not on file  Social History Narrative   Right handed   Live alone 3 stairs to door   Caffeine 2 times  daily   Social Drivers of Health   Financial Resource Strain: Low Risk  (11/05/2023)   Overall Financial Resource Strain (CARDIA)    Difficulty of Paying Living Expenses: Not hard at all  Food Insecurity: No Food Insecurity (11/05/2023)   Hunger Vital Sign    Worried About Running Out of Food in the Last Year: Never true    Ran Out of Food in the Last Year: Never true  Transportation Needs: No Transportation Needs (11/05/2023)   PRAPARE - Administrator, Civil Service (Medical): No    Lack of Transportation (Non-Medical): No  Physical Activity: Insufficiently Active (11/05/2023)   Exercise Vital Sign    Days of Exercise per Week: 1 day    Minutes of Exercise per Session: 60 min  Stress: No Stress Concern Present (11/05/2023)   [employer] of Occupational Health - Occupational Stress Questionnaire    Feeling of Stress : Not at all  Social Connections: Moderately Integrated (11/05/2023)   Social Connection and Isolation Panel [NHANES]    Frequency of Communication with Friends and Family: More than three times a week    Frequency of Social Gatherings with Friends and Family: More than three times a week    Attends Religious Services: More than 4 times per year    Active Member of Clubs or Organizations: Yes    Attends Banker Meetings: 1 to 4 times per year    Marital Status: Divorced  Intimate Partner Violence: Not At Risk (11/05/2023)   Humiliation, Afraid, Rape, and Kick questionnaire    Fear of Current or Ex-Partner: No    Emotionally Abused: No    Physically Abused: No    Sexually Abused: No    Family History  Problem Relation Age of Onset   Hypertension Mother    Stroke Mother    Arthritis Sister    COPD Sister    Depression Sister    Diabetes Sister    Alcohol abuse Brother    Hypertension Sister     Allergies as of 03/02/2024 - Review Complete 03/02/2024  Allergen Reaction Noted   Codeine Nausea And Vomiting and Nausea Only  05/11/2018   Hydrocodone Other (See Comments) 11/05/2023   Hydromorphone Nausea Only and Nausea And Vomiting 05/11/2018   Morphine Nausea Only and Other (See Comments) 05/11/2018   Oxycodone Other (See Comments) 11/05/2023   Ropinirole  10/06/2020    Current Outpatient Medications on File Prior to Encounter  Medication Sig Dispense Refill   acetaminophen (TYLENOL) 500 MG tablet Take 500 mg by mouth every 6 (six) hours as needed for mild pain.     ammonium lactate (AMLACTIN) 12 % cream APPLY THIN COAT TO AFFECTED AREAS & RUB IN TWICE A DAY IN THE MORNING & IN THE EVENING     azithromycin (ZITHROMAX) 250 MG tablet Take 2 tablets on day 1, then 1 tablet daily on days 2 through 5 6 tablet 0   benzonatate (TESSALON PERLES) 100 MG capsule Take 1 capsule (100 mg total) by mouth 3 (three) times daily as needed. 30 capsule 1   Calcium Carb-Cholecalciferol (CALCIUM 600-D PO) Take 600 mg by  mouth daily.     carbamazepine (TEGRETOL) 200 MG tablet Take 200 mg by mouth 3 (three) times daily.     Cholecalciferol (VITAMIN D3) 125 MCG (5000 UT) CAPS Take 5,000 Units by mouth daily.     denosumab (PROLIA) 60 MG/ML SOSY injection Inject 60 mg into the skin every 6 (six) months.     gabapentin (NEURONTIN) 300 MG capsule TAKE 1 CAPSULE BY MOUTH THREE TIMES A DAY (Patient taking differently: Take 300 mg by mouth 2 (two) times daily.) 90 capsule 5   hydrochlorothiazide (HYDRODIURIL) 25 MG tablet Take 0.5 tablets (12.5 mg total) by mouth daily.     hydrocortisone 2.5 % cream Apply topically daily as needed.     levothyroxine (SYNTHROID) 137 MCG tablet Take 1 tablet (137 mcg total) by mouth daily before breakfast. 90 tablet 1   meloxicam (MOBIC) 7.5 MG tablet TAKE 1 TABLET BY MOUTH EVERY DAY 30 tablet 0   methocarbamol (ROBAXIN) 750 MG tablet TAKE 1 TABLET BY MOUTH TWICE A DAY AS NEEDED FOR MUSCLE SPASM 60 tablet 0   nystatin cream (MYCOSTATIN) nystatin 100,000 unit/gram topical cream     simvastatin (ZOCOR) 40 MG  tablet Take 1 tablet (40 mg total) by mouth at bedtime. 90 tablet 3   triamcinolone cream (KENALOG) 0.1 % MIX WITH NYSTATIN AND APPLY A THIN LAYER TO THE AFFECTED AREA TWICE DAILY AS NEEDED     warfarin (COUMADIN) 5 MG tablet Take 1 tablet (5 mg total) by mouth daily. Or as directed by DVT Clinic. 30 tablet 0   No current facility-administered medications on file prior to encounter.   REVIEW OF SYSTEMS:  Review of Systems  Respiratory:  Negative for shortness of breath.   Cardiovascular:  Positive for leg swelling. Negative for chest pain and palpitations.  Musculoskeletal:  Positive for myalgias.  Neurological:  Positive for tingling. Negative for dizziness.   PHYSICAL EXAMINATION:  There were no vitals filed for this visit.  Physical Exam Vitals reviewed.  Cardiovascular:     Rate and Rhythm: Normal rate.  Pulmonary:     Effort: Pulmonary effort is normal.  Musculoskeletal:        General: No tenderness.     Right lower leg: Edema present.     Left lower leg: Edema present.  Skin:    Findings: Erythema present. No bruising.  Psychiatric:        Mood and Affect: Mood normal.        Behavior: Behavior normal.        Thought Content: Thought content normal.   Villalta Score for Post-Thrombotic Syndrome: Pain: Moderate Cramps: Mild Heaviness: Moderate Paresthesia: Mild Pruritus: Mild Pretibial Edema: Moderate Skin Induration: Absent Hyperpigmentation: Absent Redness: Mild Venous Ectasia: Absent Pain on calf compression: Absent Villalta Preliminary Score: 10 Is venous ulcer present?: No If venous ulcer is present and score is <15, then 15 points total are assigned: Absent Villalta Total Score: 10  LABS:  CBC     Component Value Date/Time   WBC 5.9 02/24/2024 0906   RBC 4.24 02/24/2024 0906   HGB 13.6 02/24/2024 0906   HCT 40.6 02/24/2024 0906   PLT 199.0 02/24/2024 0906   MCV 95.7 02/24/2024 0906   MCH 30.7 02/19/2022 0605   MCHC 33.6 02/24/2024 0906   RDW  13.2 02/24/2024 0906   LYMPHSABS 0.7 02/19/2022 0605   MONOABS 0.7 02/19/2022 0605   EOSABS 0.1 02/19/2022 0605   BASOSABS 0.1 02/19/2022 0605    Hepatic Function  Component Value Date/Time   PROT 6.4 02/24/2024 0906   ALBUMIN 4.1 02/24/2024 0906   AST 15 02/24/2024 0906   ALT 11 02/24/2024 0906   ALKPHOS 115 02/24/2024 0906   BILITOT 0.5 02/24/2024 0906    Renal Function   Lab Results  Component Value Date   CREATININE 0.96 02/24/2024   CREATININE 0.86 03/20/2023   CREATININE 0.98 05/06/2022    Estimated Creatinine Clearance: 50.4 mL/min (by C-G formula based on SCr of 0.96 mg/dL).   [facility] Vascular Lab Studies:  02/24/24 VAS [place] LOWER EXTREMITY VENOUS (DVT)  Summary:  RIGHT:  - Findings consistent with acute deep vein thrombosis involving the right  common femoral vein, right femoral vein, right popliteal vein, and TPT.  - All other veins visualized appear fully compressible and demonstrate  appropriate Doppler characteristics.    LEFT:  - Findings consistent with acute deep vein thrombosis involving the left  femoral vein, and left popliteal vein.  - All other veins visualized appear fully compressible and demonstrate  appropriate Doppler characteristics.   Right Technical Findings:  Unable to visualize the posterior tibial and peroneal veins due to body  habitus and increase lower leg swelling.   Left Technical Findings:  Unable to visualize the posterior tibial and peroneal veins due to body  habitus and increase lower leg swelling.   ASSESSMENT: Location of DVT: Right common femoral vein, Left femoral vein, Right femoral vein, Left popliteal vein, Right popliteal vein, Right distal vein  Patient without prior history of VTE diagnosed with bilateral DVTs 02/24/24. Due to involvement of the right common femoral vein and presence of extensive bilateral DVTs, discussed patient with Dr. Randie Heinz during her initial DVT Clinic visit. Patient's extended travel (8 hour in  car) on 02/23/24 is unlikely to be the primary provoking cause, as her RLE edema had been present over one month, and her last travel prior to that was many months ago, but the drive could have contributed to worsening of symptoms. Per patient, LLE edema is at baseline. She has some discomfort in both legs but does not describe them as painful. Dr. Randie Heinz recommended starting anticoagulation and he would arrange for her to follow up with him in the next few weeks for further workup including CT venogram. This will also help [place] determine duration of anticoagulation.   Due to drug interaction with carbamazepine, DOACs are contraindicated and patient requires anticoagulation with warfarin with Lovenox bridge until INR >2. CBC WNL on 02/24/24. She completed her Lovenox bridge as of 03/02/24. Carbamazepine induces warfarin metabolism which decreases warfarin concentrations, but carbamazepine is also an auto-inducer of its own metabolism, which can further decrease INRs, even weeks after starting warfarin. As a result, patient will require very close INR monitoring to maintain INR goal 2-3.   INR today is 2.0, which is within goal 2-3 and she has been on warfarin >5 days, so we will stop her Lovenox bridge. Today was her last dose of a 5-day course of azithromycin. Azithromycin can increase INR, so stopping azithromycin will likely contribute to a drop in INR. Will incorporate a couple days of 7.5 mg warfarin to try to prevent this. Her swelling has started to improve with compression and elevation.   PLAN: -Take warfarin 7.5 mg today and Friday, and 5 mg all other days. -Stop enoxaparin (Lovenox) 1.5 mg/kg (150 mg) every 24 hours. -Expected duration of therapy: pending further workup. Therapy started on 02/24/24. -Patient educated on purpose, proper use and potential adverse effects of  warfarin (Coumadin). -Counseled on dietary and drug interactions of warfarin. -Discussed importance of taking medication around the  same time every day. -Advised patient of medications to avoid (NSAIDs, aspirin doses >100 mg daily). -Educated that Tylenol (acetaminophen) is the preferred analgesic to lower the risk of bleeding. -Advised patient to alert all providers of anticoagulation therapy prior to starting a new medication or having a procedure. -Emphasized importance of monitoring for signs and symptoms of bleeding (abnormal bruising, prolonged bleeding, nose bleeds, bleeding from gums, discolored urine, black tarry stools). -Educated patient to present to the ED if emergent signs and symptoms of new thrombosis occur. -Counseled patient to wear compression stockings daily, removing at night. Elevate legs to help improve swelling.   Follow up: INR check in DVT Clinic next Tuesday. Appointment with Dr. Randie Heinz on 03/17/24.  Pervis Hocking, PharmD, [place], CPP Deep Vein Thrombosis Clinic Clinical Pharmacist Practitioner

## 2024-03-08 ENCOUNTER — Other Ambulatory Visit (INDEPENDENT_AMBULATORY_CARE_PROVIDER_SITE_OTHER)

## 2024-03-08 DIAGNOSIS — R319 Hematuria, unspecified: Secondary | ICD-10-CM

## 2024-03-08 LAB — URINALYSIS, MICROSCOPIC ONLY

## 2024-03-09 ENCOUNTER — Ambulatory Visit: Payer: Self-pay | Admitting: *Deleted

## 2024-03-09 ENCOUNTER — Ambulatory Visit (HOSPITAL_COMMUNITY)
Admission: RE | Admit: 2024-03-09 | Discharge: 2024-03-09 | Disposition: A | Discharge status: Home / Self Care | Source: Ambulatory Visit | Attending: Vascular Surgery | Admitting: Vascular Surgery

## 2024-03-09 DIAGNOSIS — Z7901 Long term (current) use of anticoagulants: Secondary | ICD-10-CM | POA: Diagnosis not present

## 2024-03-09 DIAGNOSIS — I82413 Acute embolism and thrombosis of femoral vein, bilateral: Secondary | ICD-10-CM | POA: Insufficient documentation

## 2024-03-09 LAB — POCT INR: INR: 1.8 — AB (ref 2.0–3.0)

## 2024-03-09 NOTE — Telephone Encounter (Signed)
  Chief Complaint: abdominal pain- left side Symptoms: comes and goes, lasts seconds, main complaint is pain with laying on her left side- she can feel the pain Frequency: 1-2 weeks Pertinent Negatives: Patient denies diarrhea, fever, urination pain, vomiting Disposition: [] ED /[] Urgent Care (no appt availability in office) / [x] Appointment(In office/virtual)/ []  [place] [place] [facility]/ [] Home Care/ [] Refused Recommended Disposition /[]  Mobile Bus/ []  Follow-up with PCP Additional Notes: Patient has been scheduled for appointment to have symptoms evaluated    Copied from CRM [id number]. Topic: Clinical - Red Word Triage >> Mar 09, 2024 12:30 PM Amanda Mathews wrote: Red Word that prompted transfer to Nurse Triage: pain on back left side & groin, can't lay down. Reason for Disposition  [1] MODERATE pain (e.g., interferes with normal activities) AND [2] pain comes and goes (cramps) AND [3] present > 24 hours  (Exception: Pain with Vomiting or Diarrhea - see that Guideline.)  Answer Assessment - Initial Assessment Questions 1. LOCATION: "Where does it hurt?"      Lower abdominal pain- left 2. RADIATION: "Does the pain shoot anywhere else?" (e.g., chest, back)     Groin area and back left side- just painful to lay down 3. ONSET: "When did the pain begin?" (e.g., minutes, hours or days ago)      Back - 2 days ago, pelvic pain- over 1 week  5. PATTERN "Does the pain come and go, or is it constant?"    - If it comes and goes: "How long does it last?" "Do you have pain now?"     (Note: Comes and goes means the pain is intermittent. It goes away completely between bouts.)    - If constant: "Is it getting better, staying the same, or getting worse?"      (Note: Constant means the pain never goes away completely; most serious pain is constant and gets worse.)      Comes and goes- lasting seconds 6. SEVERITY: "How bad is the pain?"  (e.g., Scale 1-10; mild, moderate, or severe)    - MILD (1-3):  Doesn't interfere with normal activities, abdomen soft and not tender to touch.     - MODERATE (4-7): Interferes with normal activities or awakens from sleep, abdomen tender to touch.     - SEVERE (8-10): Excruciating pain, doubled over, unable to do any normal activities.       3-4/10 7. RECURRENT SYMPTOM: "Have you ever had this type of stomach pain before?" If Yes, ask: "When was the last time?" and "What happened that time?"      Patient has stimulator in back- she has back problems anyway 8. CAUSE: "What do you think is causing the stomach pain?"     Not sure 9. RELIEVING/AGGRAVATING FACTORS: "What makes it better or worse?" (e.g., antacids, bending or twisting motion, bowel movement)     No- sit helps a little 10. OTHER SYMPTOMS: "Do you have any other symptoms?" (e.g., back pain, diarrhea, fever, urination pain, vomiting)       Constipation- 2 weeks- BM- small BM last night  Protocols used: Abdominal Pain - Female-A-AH

## 2024-03-09 NOTE — Patient Instructions (Signed)
 Take 7.5 mg (1 and a half tablets) on Mondays, Wednesdays, and Fridays and 5 mg (1 tablet) of warfarin all other days.   -It is important to take your medication around the same time every day.  -Avoid NSAIDs like ibuprofen (Advil, Motrin) and naproxen (Aleve) as well as aspirin doses over 100 mg daily. -Tylenol (acetaminophen) is the preferred over the counter pain medication to lower the risk of bleeding. -Be sure to alert all of your health care providers that you are taking an anticoagulant prior to starting a new medication or having a procedure. -Monitor for signs and symptoms of bleeding (abnormal bruising, prolonged bleeding, nose bleeds, bleeding from gums, discolored urine, black tarry stools). If you have fallen and hit your head OR if your bleeding is severe or not stopping, seek emergency care.  -Go to the emergency room if emergent signs and symptoms of new clot occur (new or worse swelling and pain in an arm or leg, shortness of breath, chest pain, fast or irregular heartbeats, lightheadedness, dizziness, fainting, coughing up blood) or if you experience a significant color change (pale or blue) in the extremity that has the DVT.   Your next visit is on Thursday April 3rd at 10am.  [facility] [address] [place], [place], [place] [address] Enter the hospital through Entrance C off [facility] and pull up to the [facility] entrance to the free valet parking.  Check in for your appointment at the [facility].   If you have any questions or need to reschedule an appointment, please call [phone] [facility] If you are having an emergency, call 911 or present to the nearest emergency room.   What is a DVT?  -Deep vein thrombosis (DVT) is a condition in which a blood clot forms in a vein of the deep venous system which can occur in the lower leg, thigh, pelvis, arm, or neck. This condition is serious and can be life-threatening if the  clot travels to the arteries of the lungs and causing a blockage (pulmonary embolism, PE). A DVT can also damage veins in the leg, which can lead to long-term venous disease, leg pain, swelling, discoloration, and ulcers or sores (post-thrombotic syndrome).  -Treatment may include taking an anticoagulant medication to prevent more clots from forming and the current clot from growing, wearing compression stockings, and/or surgical procedures to remove or dissolve the clot.

## 2024-03-09 NOTE — Progress Notes (Signed)
 DVT Clinic Note  Name: Amanda Mathews     MRN: [id number]     DOB: 05-29-1946     Sex: female  PCP: Ardith Dark, MD  Today's Visit: Visit Information: Follow Up Visit  Referred to DVT Clinic by: Primary Care - Dr. [name] Referred to CPP by: Dr. Randie Heinz Reason for referral:  Chief Complaint  Patient presents with   Warfarin management   HISTORY OF PRESENT ILLNESS: Amanda Mathews is a 78 y.o. female with PMH HTN, HLD, hypothyroidism, osteoporosis, OSA, chronic back pain, trigeminal neuralgia on carbamazepine, who presents after diagnosis of DVT for medication management. Patient was seen by her PCP and reported bilateral lower extremity edema, right worse than left, along with pain for the prior month. Ultrasound showed bilateral acute DVTs and she was referred to DVT Clinic to initiate treatment. No prior personal or family history of VTE. Initially seen in DVT Clinic 02/24/24, initiated anticoagulation with warfarin and Lovenox bridge due to drug interaction with DOACs and carbamazepine. Dr. 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 She is following in DVT Clinic for INR management.  Today, patient arrives in good spirits and is ambulating well with a walker. Continues to have no SOB, CP, palpitations. She reports that her pain and swelling have started to improve with elevation. She has purchased compression stockings. It took her a few tries to find some that fit both of her legs, as she currently needs two different sizes. Denies abnormal bleeding or bruising. Denies missed doses of warfarin.   Positive Thrombotic Risk Factors: Obesity, Older Age, Sedentary journey lasting >8 hours within 4 weeks, Other (comment) (long car ride occurred after sympton onset) Bleeding Risk Factors: Age >65 years, Anticoagulant therapy  Negative Thrombotic Risk Factors: Previous VTE, Recent surgery (within 3  months), Recent trauma (within 3 months), Recent admission to hospital with acute illness (within 3 months), Paralysis, paresis, or recent plaster cast immobilization of lower extremity, Central venous catheterization, Bed rest >72 hours within 3 months, Pregnancy, Recent cesarean section (within 3 months), Within 6 weeks postpartum, Estrogen therapy, Testosterone therapy, Erythropoiesis-stimulating agent, Recent COVID diagnosis (within 3 months), Active cancer, Non-malignant, chronic inflammatory condition, Known thrombophilic condition, Smoking  Rx Insurance Coverage: Medicare Rx Affordability: Lovenox is $10/month. Warfarin is $4/month. Rx Assistance Provided:  None needed at this time Preferred Pharmacy: Lovenox and warfarin filled at [facility] during initial DVT visit.  Past Medical History:  Diagnosis Date   Cataract    Osteoporosis    Sleep apnea    Thyroid disease     Past Surgical History:  Procedure Laterality Date   back stimulator      BUNIONECTOMY     CHOLECYSTECTOMY     FEMUR FRACTURE SURGERY     REPLACEMENT TOTAL KNEE BILATERAL Bilateral     Social History   Socioeconomic History   Marital status: Divorced    Spouse name: Not on file   Number of children: Not on file   Years of education: Not on file   Highest education level: Associate degree: occupational, Scientist, product/process development, or vocational program  Occupational History   Occupation: Retired    Occupation: retired    Comment: [employer]  Tobacco Use   Smoking status: Never   Smokeless tobacco: Never  Vaping Use   Vaping status: Never Used  Substance and Sexual Activity   Alcohol use: Never   Drug  use: Never   Sexual activity: Not Currently    Birth control/protection: Abstinence  Other Topics Concern   Not on file  Social History Narrative   Right handed   Live alone 3 stairs to door   Caffeine 2 times daily   Social Drivers of Health   Financial Resource Strain: Low Risk  (11/05/2023)   Overall  Financial Resource Strain (CARDIA)    Difficulty of Paying Living Expenses: Not hard at all  Food Insecurity: No Food Insecurity (11/05/2023)   Hunger Vital Sign    Worried About Running Out of Food in the Last Year: Never true    Ran Out of Food in the Last Year: Never true  Transportation Needs: No Transportation Needs (11/05/2023)   PRAPARE - Administrator, Civil Service (Medical): No    Lack of Transportation (Non-Medical): No  Physical Activity: Insufficiently Active (11/05/2023)   Exercise Vital Sign    Days of Exercise per Week: 1 day    Minutes of Exercise per Session: 60 min  Stress: No Stress Concern Present (11/05/2023)   [employer] of Occupational Health - Occupational Stress Questionnaire    Feeling of Stress : Not at all  Social Connections: Moderately Integrated (11/05/2023)   Social Connection and Isolation Panel [NHANES]    Frequency of Communication with Friends and Family: More than three times a week    Frequency of Social Gatherings with Friends and Family: More than three times a week    Attends Religious Services: More than 4 times per year    Active Member of Clubs or Organizations: Yes    Attends Banker Meetings: 1 to 4 times per year    Marital Status: Divorced  Intimate Partner Violence: Not At Risk (11/05/2023)   Humiliation, Afraid, Rape, and Kick questionnaire    Fear of Current or Ex-Partner: No    Emotionally Abused: No    Physically Abused: No    Sexually Abused: No    Family History  Problem Relation Age of Onset   Hypertension Mother    Stroke Mother    Arthritis Sister    COPD Sister    Depression Sister    Diabetes Sister    Alcohol abuse Brother    Hypertension Sister     Allergies as of 03/09/2024 - Review Complete 03/09/2024  Allergen Reaction Noted   Codeine Nausea And Vomiting and Nausea Only 05/11/2018   Hydrocodone Other (See Comments) 11/05/2023   Hydromorphone Nausea Only and Nausea And  Vomiting 05/11/2018   Morphine Nausea Only and Other (See Comments) 05/11/2018   Oxycodone Other (See Comments) 11/05/2023   Ropinirole  10/06/2020    Current Outpatient Medications on File Prior to Encounter  Medication Sig Dispense Refill   warfarin (COUMADIN) 5 MG tablet Take 1 tablet (5 mg total) by mouth daily. Or as directed by DVT Clinic. 30 tablet 0   acetaminophen (TYLENOL) 500 MG tablet Take 500 mg by mouth every 6 (six) hours as needed for mild pain.     ammonium lactate (AMLACTIN) 12 % cream APPLY THIN COAT TO AFFECTED AREAS & RUB IN TWICE A DAY IN THE MORNING & IN THE EVENING     benzonatate (TESSALON PERLES) 100 MG capsule Take 1 capsule (100 mg total) by mouth 3 (three) times daily as needed. 30 capsule 1   Calcium Carb-Cholecalciferol (CALCIUM 600-D PO) Take 600 mg by mouth daily.     carbamazepine (TEGRETOL) 200 MG tablet Take 200 mg by  mouth 3 (three) times daily.     Cholecalciferol (VITAMIN D3) 125 MCG (5000 UT) CAPS Take 5,000 Units by mouth daily.     denosumab (PROLIA) 60 MG/ML SOSY injection Inject 60 mg into the skin every 6 (six) months.     gabapentin (NEURONTIN) 300 MG capsule TAKE 1 CAPSULE BY MOUTH THREE TIMES A DAY (Patient taking differently: Take 300 mg by mouth 2 (two) times daily.) 90 capsule 5   hydrochlorothiazide (HYDRODIURIL) 25 MG tablet Take 0.5 tablets (12.5 mg total) by mouth daily.     hydrocortisone 2.5 % cream Apply topically daily as needed.     levothyroxine (SYNTHROID) 137 MCG tablet Take 1 tablet (137 mcg total) by mouth daily before breakfast. 90 tablet 1   meloxicam (MOBIC) 7.5 MG tablet TAKE 1 TABLET BY MOUTH EVERY DAY 30 tablet 0   methocarbamol (ROBAXIN) 750 MG tablet TAKE 1 TABLET BY MOUTH TWICE A DAY AS NEEDED FOR MUSCLE SPASM 60 tablet 0   nystatin cream (MYCOSTATIN) nystatin 100,000 unit/gram topical cream     simvastatin (ZOCOR) 40 MG tablet Take 1 tablet (40 mg total) by mouth at bedtime. 90 tablet 3   triamcinolone cream (KENALOG)  0.1 % MIX WITH NYSTATIN AND APPLY A THIN LAYER TO THE AFFECTED AREA TWICE DAILY AS NEEDED     No current facility-administered medications on file prior to encounter.   REVIEW OF SYSTEMS:  Review of Systems  Respiratory:  Negative for shortness of breath.   Cardiovascular:  Positive for leg swelling. Negative for chest pain and palpitations.  Musculoskeletal:  Positive for myalgias.  Neurological:  Positive for tingling. Negative for dizziness.   PHYSICAL EXAMINATION:  There were no vitals filed for this visit.  Physical Exam Vitals reviewed.  Cardiovascular:     Rate and Rhythm: Normal rate.  Pulmonary:     Effort: Pulmonary effort is normal.  Musculoskeletal:        General: No tenderness.     Right lower leg: Edema present.     Left lower leg: Edema present.  Skin:    Findings: Erythema present. No bruising.  Psychiatric:        Mood and Affect: Mood normal.        Behavior: Behavior normal.        Thought Content: Thought content normal.   Villalta Score for Post-Thrombotic Syndrome: Pain: Moderate Cramps: Moderate Heaviness: Moderate Paresthesia: Mild Pruritus: Mild Pretibial Edema: Moderate Skin Induration: Absent Hyperpigmentation: Absent Redness: Absent Venous Ectasia: Absent Pain on calf compression: Absent Villalta Preliminary Score: 10 Is venous ulcer present?: No If venous ulcer is present and score is <15, then 15 points total are assigned: Absent Villalta Total Score: 10  LABS:  CBC     Component Value Date/Time   WBC 5.9 02/24/2024 0906   RBC 4.24 02/24/2024 0906   HGB 13.6 02/24/2024 0906   HCT 40.6 02/24/2024 0906   PLT 199.0 02/24/2024 0906   MCV 95.7 02/24/2024 0906   MCH 30.7 02/19/2022 0605   MCHC 33.6 02/24/2024 0906   RDW 13.2 02/24/2024 0906   LYMPHSABS 0.7 02/19/2022 0605   MONOABS 0.7 02/19/2022 0605   EOSABS 0.1 02/19/2022 0605   BASOSABS 0.1 02/19/2022 0605    Hepatic Function      Component Value Date/Time   PROT 6.4  02/24/2024 0906   ALBUMIN 4.1 02/24/2024 0906   AST 15 02/24/2024 0906   ALT 11 02/24/2024 0906   ALKPHOS 115 02/24/2024 0906   BILITOT 0.5  02/24/2024 0906    Renal Function   Lab Results  Component Value Date   CREATININE 0.96 02/24/2024   CREATININE 0.86 03/20/2023   CREATININE 0.98 05/06/2022    Estimated Creatinine Clearance: 50.4 mL/min (by C-G formula based on SCr of 0.96 mg/dL).   [facility] Vascular Lab Studies:  02/24/24 VAS [place] LOWER EXTREMITY VENOUS (DVT)  Summary:  RIGHT:  - Findings consistent with acute deep vein thrombosis involving the right  common femoral vein, right femoral vein, right popliteal vein, and TPT.  - All other veins visualized appear fully compressible and demonstrate  appropriate Doppler characteristics.    LEFT:  - Findings consistent with acute deep vein thrombosis involving the left  femoral vein, and left popliteal vein.  - All other veins visualized appear fully compressible and demonstrate  appropriate Doppler characteristics.   Right Technical Findings:  Unable to visualize the posterior tibial and peroneal veins due to body  habitus and increase lower leg swelling.   Left Technical Findings:  Unable to visualize the posterior tibial and peroneal veins due to body  habitus and increase lower leg swelling.   ASSESSMENT: Location of DVT: Right common femoral vein, Left femoral vein, Right femoral vein, Left popliteal vein, Right popliteal vein, Right distal vein  Patient without prior history of VTE diagnosed with bilateral DVTs 02/24/24. Due to involvement of the right common femoral vein and presence of extensive bilateral DVTs, discussed patient with Dr. Randie Heinz during her initial DVT Clinic visit. Patient's extended travel (8 hour in car) on 02/23/24 is unlikely to be the primary provoking cause, as her RLE edema had been present over one month, and her last travel prior to that was many months ago, but the drive could have contributed to  worsening of symptoms. Per patient, LLE edema is at baseline. She has some discomfort in both legs but does not describe them as painful. Dr. Randie Heinz recommended starting anticoagulation and he would arrange for her to follow up with him in the next few weeks for further workup including CT venogram. This will also help [place] determine duration of anticoagulation.   Due to drug interaction with carbamazepine, DOACs are contraindicated and patient requires anticoagulation with warfarin with Lovenox bridge until INR >2. CBC WNL on 02/24/24. She completed her Lovenox bridge as of 03/02/24. Carbamazepine induces warfarin metabolism which decreases warfarin concentrations, but carbamazepine is also an auto-inducer of its own metabolism, which can further decrease INRs, even weeks after starting warfarin. As a result, patient will require very close INR monitoring to maintain INR goal 2-3.   INR today is slightly subtherapeutic at 1.8 after completing azithromycin treatment for URI. Will increase her weekly dose to get her back to goal 2-3. No need for Lovenox. Her swelling has started to improve with compression and elevation. Her CT is scheduled for 03/11/24, then she sees Dr. Randie Heinz 03/17/24.   PLAN: -Take 7.5 mg (1 and a half tablets) on Mondays, Wednesdays, and Fridays and 5 mg (1 tablet) of warfarin all other days.  -Expected duration of therapy: pending further workup. Therapy started on 02/24/24. -Patient educated on purpose, proper use and potential adverse effects of warfarin (Coumadin). -Counseled on dietary and drug interactions of warfarin. -Discussed importance of taking medication around the same time every day. -Advised patient of medications to avoid (NSAIDs, aspirin doses >100 mg daily). -Educated that Tylenol (acetaminophen) is the preferred analgesic to lower the risk of bleeding. -Advised patient to alert all providers of anticoagulation therapy prior to starting  a new medication or having a  procedure. -Emphasized importance of monitoring for signs and symptoms of bleeding (abnormal bruising, prolonged bleeding, nose bleeds, bleeding from gums, discolored urine, black tarry stools). -Educated patient to present to the ED if emergent signs and symptoms of new thrombosis occur. -Counseled patient to wear compression stockings daily, removing at night. Elevate legs to help improve swelling.   Follow up: INR check in DVT Clinic next Thursday. Appointment with Dr. Randie Heinz on 03/17/24.  Pervis Hocking, PharmD, [place], CPP Deep Vein Thrombosis Clinic Clinical Pharmacist Practitioner

## 2024-03-09 NOTE — Telephone Encounter (Signed)
 Noted, patient schedule with PCP

## 2024-03-10 ENCOUNTER — Encounter: Payer: Self-pay | Admitting: Physician Assistant

## 2024-03-10 ENCOUNTER — Ambulatory Visit (INDEPENDENT_AMBULATORY_CARE_PROVIDER_SITE_OTHER): Admitting: Physician Assistant

## 2024-03-10 VITALS — BP 130/80 | HR 66 | Temp 97.9°F | Ht 60.0 in | Wt 192.2 lb

## 2024-03-10 DIAGNOSIS — R1032 Left lower quadrant pain: Secondary | ICD-10-CM | POA: Diagnosis not present

## 2024-03-10 DIAGNOSIS — M1990 Unspecified osteoarthritis, unspecified site: Secondary | ICD-10-CM

## 2024-03-10 DIAGNOSIS — I10 Essential (primary) hypertension: Secondary | ICD-10-CM

## 2024-03-10 DIAGNOSIS — R319 Hematuria, unspecified: Secondary | ICD-10-CM

## 2024-03-10 LAB — URINALYSIS, ROUTINE W REFLEX MICROSCOPIC
Bilirubin Urine: NEGATIVE
Ketones, ur: NEGATIVE
Leukocytes,Ua: NEGATIVE
Nitrite: NEGATIVE
Specific Gravity, Urine: 1.015 (ref 1.000–1.030)
Total Protein, Urine: NEGATIVE
Urine Glucose: NEGATIVE
Urobilinogen, UA: 0.2 (ref 0.0–1.0)
pH: 6 (ref 5.0–8.0)

## 2024-03-10 NOTE — Progress Notes (Signed)
 Amanda Mathews is a 78 y.o. female here for a new problem.  History of Present Illness:   Chief Complaint  Patient presents with   Abdominal Pain    Pt c/o left lower quadrant abdominal pain radiating into groin x 1 week.    Abdominal Pain  Patient complains of lower abdominal pain that has persisted for the past week.  Pain tends to radiate to her groin region and to her lower back. She does reports having similar symptoms previously but can't seem to identify any triggers.    Associated symptoms include constipation  Reports taking Miralax for her constipation and states that pain improved after having a BM which was several days ago. Patient also states that se often strains when having a BM. 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  Denies any nausea or changes in appetite.  Hematuria Urine analysis done n early march during a visit with Dr. [name] showed traces of blood, however, she denies any history of kidney stones.  She does report following up with a urologist, Dr. Earlene Plater with [facility] last year for increased urinary frequency. cystoscopy done at that time was normal.   Osteoarthritis  Reports compliance and good tolerance of meloxicam 7.5 mg daily. She was not informed that she should stop it as long as she is on Coumadin. She is scheduled to follow up with cardiology soon.   Elevated BP  BP is 150/80 today.  She states that her BP readings at home are ranging between 99-106/58-68.  Reports compliance and good tolerance of hydrochlorothiazide 25 m Patient denies chest pain, SOB, blurred vision, dizziness, unusual headaches, lower leg swelling. Patient is compliant with medication. Denies excessive caffeine intake, stimulant usage, excessive alcohol intake, or increase in salt.  She is scheduled for a CT scan tomorrow to examine blood flow regarding her DVT.  BP Readings from Last 3  Encounters:  03/10/24 130/80  02/27/24 130/80  02/24/24 132/79    Past Medical History:  Diagnosis Date   Cataract    Osteoporosis    Sleep apnea    Thyroid disease      Social History   Tobacco Use   Smoking status: Never   Smokeless tobacco: Never  Vaping Use   Vaping status: Never Used  Substance Use Topics   Alcohol use: Never   Drug use: Never    Past Surgical History:  Procedure Laterality Date   back stimulator      BUNIONECTOMY     CHOLECYSTECTOMY     FEMUR FRACTURE SURGERY     REPLACEMENT TOTAL KNEE BILATERAL Bilateral     Family History  Problem Relation Age of Onset   Hypertension Mother    Stroke Mother    Arthritis Sister    COPD Sister    Depression Sister    Diabetes Sister    Alcohol abuse Brother    Hypertension Sister     Allergies  Allergen Reactions   Codeine Nausea And Vomiting and Nausea Only    Other reaction(s): vomiting   Hydrocodone Other (See Comments)    Hallucinate    Hydromorphone Nausea Only and Nausea And Vomiting   Morphine Nausea Only and Other (See Comments)    Hallucinations  Other reaction(s): Delusions (intolerance), hallucinations, Other (See Comments) Hallucinations     Oxycodone Other (See Comments)    Sick and nervous   Ropinirole  hallucinations Other reaction(s): Delusions (intolerance) hallucinations    Current Medications:   Current Outpatient Medications:    acetaminophen (TYLENOL) 500 MG tablet, Take 500 mg by mouth every 6 (six) hours as needed for mild pain., Disp: , Rfl:    ammonium lactate (AMLACTIN) 12 % cream, APPLY THIN COAT TO AFFECTED AREAS & RUB IN TWICE A DAY IN THE MORNING & IN THE EVENING, Disp: , Rfl:    benzonatate (TESSALON PERLES) 100 MG capsule, Take 1 capsule (100 mg total) by mouth 3 (three) times daily as needed., Disp: 30 capsule, Rfl: 1   Calcium Carb-Cholecalciferol (CALCIUM 600-D PO), Take 600 mg by mouth daily., Disp: , Rfl:    carbamazepine (TEGRETOL) 200 MG tablet,  Take 200 mg by mouth 3 (three) times daily., Disp: , Rfl:    Cholecalciferol (VITAMIN D3) 125 MCG (5000 UT) CAPS, Take 5,000 Units by mouth daily., Disp: , Rfl:    denosumab (PROLIA) 60 MG/ML SOSY injection, Inject 60 mg into the skin every 6 (six) months., Disp: , Rfl:    gabapentin (NEURONTIN) 300 MG capsule, TAKE 1 CAPSULE BY MOUTH THREE TIMES A DAY (Patient taking differently: Take 300 mg by mouth 2 (two) times daily.), Disp: 90 capsule, Rfl: 5   hydrochlorothiazide (HYDRODIURIL) 25 MG tablet, Take 0.5 tablets (12.5 mg total) by mouth daily., Disp: , Rfl:    hydrocortisone 2.5 % cream, Apply topically daily as needed., Disp: , Rfl:    levothyroxine (SYNTHROID) 137 MCG tablet, Take 1 tablet (137 mcg total) by mouth daily before breakfast., Disp: 90 tablet, Rfl: 1   meloxicam (MOBIC) 7.5 MG tablet, TAKE 1 TABLET BY MOUTH EVERY DAY, Disp: 30 tablet, Rfl: 0   methocarbamol (ROBAXIN) 750 MG tablet, TAKE 1 TABLET BY MOUTH TWICE A DAY AS NEEDED FOR MUSCLE SPASM, Disp: 60 tablet, Rfl: 0   nystatin cream (MYCOSTATIN), nystatin 100,000 unit/gram topical cream, Disp: , Rfl:    simvastatin (ZOCOR) 40 MG tablet, Take 1 tablet (40 mg total) by mouth at bedtime., Disp: 90 tablet, Rfl: 3   triamcinolone cream (KENALOG) 0.1 %, MIX WITH NYSTATIN AND APPLY A THIN LAYER TO THE AFFECTED AREA TWICE DAILY AS NEEDED, Disp: , Rfl:    warfarin (COUMADIN) 5 MG tablet, Take 1 tablet (5 mg total) by mouth daily. Or as directed by [facility]., Disp: 30 tablet, Rfl: 0   Review of Systems:   Review of Systems  Gastrointestinal:  Positive for abdominal pain and constipation.  Genitourinary:  Positive for hematuria.    Vitals:   Vitals:   03/10/24 1059 03/10/24 1143  BP: (!) 150/80 130/80  Pulse: 66   Temp: 97.9 F (36.6 C)   TempSrc: Temporal   SpO2: 94%   Weight: 192 lb 4 oz (87.2 kg)   Height: 5' (1.524 m)      Body mass index is 37.55 kg/m.  Physical Exam:   Physical Exam Vitals and nursing note  reviewed.  Constitutional:      General: She is not in acute distress.    Appearance: She is well-developed. She is not ill-appearing or toxic-appearing.  Cardiovascular:     Rate and Rhythm: Normal rate and regular rhythm.     Pulses: Normal pulses.     Heart sounds: Normal heart sounds, S1 normal and S2 normal.  Pulmonary:     Effort: Pulmonary effort is normal.     Breath sounds: Normal breath sounds.  Abdominal:     General: Abdomen is flat. Bowel sounds are  normal.     Palpations: Abdomen is soft.     Tenderness: There is no abdominal tenderness. There is no right CVA tenderness, left CVA tenderness, guarding or rebound.  Skin:    General: Skin is warm and dry.  Neurological:     Mental Status: She is alert.     GCS: GCS eye subscore is 4. GCS verbal subscore is 5. GCS motor subscore is 6.  Psychiatric:        Speech: Speech normal.        Behavior: Behavior normal. Behavior is cooperative.     Assessment and Plan:   Left lower quadrant abdominal pain No red flags No evidence of acute abdomen, do not feel urgent imaging is warranted Unable to reproduce tenderness on my exam Recommend regular intake of miralax to help with constipation Recommend update UA for further evaluation as well If symptom(s) do not improve or if they worsen, she will let us know  Recommend ER if symptom(s) worsen  Hematuria, unspecified type Recheck UA today She may need to return to her urologist if hematuria persists Will also order urine culture  Osteoarthritis, unspecified osteoarthritis type, unspecified site Discussed need to avoid meloxicam while taking coumadin Consider tylenol as needed for pain Reach out to Primary Care Provider (PCP) for ongoing management of symptom(s) if treatment is inadequate  Essential hypertension Blood pressure improved on recheck Continue hydrochloroTHIAZIDE 25 mg daily Continue close monitoring and follow up with Primary Care Provider (PCP) if readings  are elevated    Jarold Motto, PA-C  I,Safa M Kadhim,acting as a scribe for Jarold Motto, PA.,have documented all relevant documentation on the behalf of Jarold Motto, PA,as directed by  Jarold Motto, PA while in the presence of Jarold Motto, [place].   I, Jarold Motto, [place], have reviewed all documentation for this visit. The documentation on 03/10/24 for the exam, diagnosis, procedures, and orders are all accurate and complete.

## 2024-03-10 NOTE — Patient Instructions (Addendum)
 It was great to see you!  For your blood pressure: Please bring your blood pressure monitor with you next time you come in so we can recheck this  For your blood in your urine: We will recheck your urine today If blood persists, we will have you reach out to Dr Earlene Plater, your urologist, for further evaluation  For your coumadin: Please hold your meloxicam while taking this as it can increase your risk of bleeding  For your abdominal pain: -Drink at least 64 oz of water daily -Add in 1 capful of polyethylene glycol (also known as Miralax, however generic is fine!) to beverage of choice daily  Goal is to have a formed, soft bowel movement regularly   -After a few days if no success, may increase to a total of 2 capfuls of polyethylene glycol/ Miralax daily  If still no results, please call the office   IF ANY CHANGES TO SYMPTOM(S), PLEASE let me know   Take care,  Jarold Motto PA-C

## 2024-03-11 ENCOUNTER — Encounter: Payer: Self-pay | Admitting: Family Medicine

## 2024-03-11 ENCOUNTER — Ambulatory Visit (HOSPITAL_COMMUNITY)
Admission: RE | Admit: 2024-03-11 | Discharge: 2024-03-11 | Disposition: A | Discharge status: Home / Self Care | Source: Ambulatory Visit | Attending: Vascular Surgery | Admitting: Vascular Surgery

## 2024-03-11 ENCOUNTER — Encounter: Payer: Self-pay | Admitting: Physician Assistant

## 2024-03-11 DIAGNOSIS — I82413 Acute embolism and thrombosis of femoral vein, bilateral: Secondary | ICD-10-CM | POA: Diagnosis not present

## 2024-03-11 DIAGNOSIS — K5730 Diverticulosis of large intestine without perforation or abscess without bleeding: Secondary | ICD-10-CM | POA: Diagnosis not present

## 2024-03-11 DIAGNOSIS — N281 Cyst of kidney, acquired: Secondary | ICD-10-CM | POA: Diagnosis not present

## 2024-03-11 DIAGNOSIS — I82411 Acute embolism and thrombosis of right femoral vein: Secondary | ICD-10-CM | POA: Diagnosis not present

## 2024-03-11 DIAGNOSIS — K8689 Other specified diseases of pancreas: Secondary | ICD-10-CM | POA: Diagnosis not present

## 2024-03-11 LAB — URINE CULTURE
MICRO NUMBER:: 16250632
Result:: NO GROWTH
SPECIMEN QUALITY:: ADEQUATE

## 2024-03-11 MED ORDER — IOHEXOL 350 MG/ML SOLN
125.0000 mL | Freq: Once | INTRAVENOUS | Status: AC | PRN
  Administered 2024-03-11: 125 mL via INTRAVENOUS

## 2024-03-11 NOTE — Progress Notes (Signed)
 Her urine sample still shows some blood in her urine.  Recommend she see urology soon for this.  Please place referral if needed.

## 2024-03-12 ENCOUNTER — Telehealth: Payer: Self-pay | Admitting: *Deleted

## 2024-03-12 NOTE — Telephone Encounter (Signed)
 We can put in another referral. This does not need to wait 7 months.  Amanda Mathews. [name], MD 03/12/2024 9:54 AM

## 2024-03-12 NOTE — Telephone Encounter (Signed)
 Ok to call and/or place new referral if needed.  Katina Degree. [name], MD 03/12/2024 12:52 PM

## 2024-03-12 NOTE — Telephone Encounter (Signed)
**Note De-identified  [place] Obfuscation** Please advise 

## 2024-03-12 NOTE — Telephone Encounter (Signed)
 Copied from CRM [id number]. Topic: Clinical - Medical Advice >> Mar 11, 2024 12:01 PM Pascal Lux wrote: Reason for CRM: Patient stated that she did reach out to her urologist per provider request and they said that they will not be able to get her in until October. Patient stated is there anything we can do for her to get seen sooner by Dr. Earlene Plater? Or is it okay for her to wait that long or should she call them and advise she has blood in urine? She is requesting a call back for clarity tomorrow.

## 2024-03-12 NOTE — Telephone Encounter (Signed)
 LVM to return call.

## 2024-03-15 ENCOUNTER — Other Ambulatory Visit: Payer: Self-pay | Admitting: *Deleted

## 2024-03-15 DIAGNOSIS — R319 Hematuria, unspecified: Secondary | ICD-10-CM

## 2024-03-15 NOTE — Telephone Encounter (Signed)
 Copied from CRM [id number]. Topic: General - Other >> Mar 12, 2024  3:09 PM Tiffany S wrote: Reason for CRM: Patient calling to see if she can get an earlier appt with the provider she was referred to. Please follow up with patient

## 2024-03-15 NOTE — Telephone Encounter (Signed)
 New referral placed.

## 2024-03-16 ENCOUNTER — Telehealth: Payer: Self-pay

## 2024-03-16 DIAGNOSIS — R350 Frequency of micturition: Secondary | ICD-10-CM | POA: Diagnosis not present

## 2024-03-16 NOTE — Telephone Encounter (Signed)
 Copied from CRM [id number]. Topic: General - Other >> Mar 15, 2024  3:54 PM Rodman Pickle T wrote: Reason for CRM: patient is calling in letting dr [name] know she has a appt with the urologist tomorrow  Please see call note as FYI from patient

## 2024-03-17 ENCOUNTER — Ambulatory Visit: Admitting: Vascular Surgery

## 2024-03-17 ENCOUNTER — Encounter: Payer: Self-pay | Admitting: Vascular Surgery

## 2024-03-17 VITALS — BP 129/83 | HR 75 | Temp 97.8°F | Ht 60.0 in | Wt 191.0 lb

## 2024-03-17 DIAGNOSIS — I82413 Acute embolism and thrombosis of femoral vein, bilateral: Secondary | ICD-10-CM | POA: Diagnosis not present

## 2024-03-17 NOTE — Progress Notes (Signed)
 Patient ID: Amanda Mathews, female   DOB: 1946-10-12, 78 y.o.   MRN: [id number]  Reason for Consult: New Patient (Initial Visit)   Referred by Ardith Dark, MD  Subjective:     HPI:  Amanda Mathews is a 78 y.o. female with history of right greater than left lower extremity swelling after knee replacements and also has a rod in her right femur.  She recently had extensive bilateral lower extremity DVT which was her first episode of DVT.  The exact timing of this is not exactly known as is the increased welling was present for multiple weeks prior to evaluation.  She does walk with the help of a walker.  She states that swelling is improving now that she is wearing compression stockings and is almost back to her baseline.  The time of her initial DVT diagnosis [place] sitting.  She is now on Coumadin and is followed by medicine at the DVT clinic.  Past Medical History:  Diagnosis Date   Cataract    Osteoporosis    Sleep apnea    Thyroid disease    Family History  Problem Relation Age of Onset   Hypertension Mother    Stroke Mother    Arthritis Sister    COPD Sister    Depression Sister    Diabetes Sister    Alcohol abuse Brother    Hypertension Sister    Past Surgical History:  Procedure Laterality Date   back stimulator      BUNIONECTOMY     CHOLECYSTECTOMY     FEMUR FRACTURE SURGERY     REPLACEMENT TOTAL KNEE BILATERAL Bilateral     Short Social History:  Social History   Tobacco Use   Smoking status: Never   Smokeless tobacco: Never  Substance Use Topics   Alcohol use: Never    Allergies  Allergen Reactions   Codeine Nausea And Vomiting and Nausea Only    Other reaction(s): vomiting   Hydrocodone Other (See Comments)    Hallucinate    Hydromorphone Nausea Only and Nausea And Vomiting   Morphine Nausea Only and Other (See Comments)    Hallucinations  Other reaction(s): Delusions  (intolerance), hallucinations, Other (See Comments) Hallucinations     Oxycodone Other (See Comments)    Sick and nervous   Ropinirole     hallucinations Other reaction(s): Delusions (intolerance) hallucinations    Current Outpatient Medications  Medication Sig Dispense Refill   acetaminophen (TYLENOL) 500 MG tablet Take 500 mg by mouth every 6 (six) hours as needed for mild pain.     ammonium lactate (AMLACTIN) 12 % cream APPLY THIN COAT TO AFFECTED AREAS & RUB IN TWICE A DAY IN THE MORNING & IN THE EVENING     benzonatate (TESSALON PERLES) 100 MG capsule Take 1 capsule (100 mg total) by mouth 3 (three) times daily as needed. 30 capsule 1   Calcium Carb-Cholecalciferol (CALCIUM 600-D PO) Take 600 mg by mouth daily.     carbamazepine (TEGRETOL) 200 MG tablet Take 200 mg by mouth 3 (three) times daily.     Cholecalciferol (VITAMIN D3) 125 MCG (5000 UT) CAPS Take 5,000 Units by mouth daily.     denosumab (PROLIA) 60 MG/ML SOSY injection Inject 60 mg into the skin every 6 (six) months.     gabapentin (NEURONTIN) 300 MG capsule TAKE 1 CAPSULE BY MOUTH THREE TIMES A DAY (Patient taking differently: Take 300  mg by mouth 2 (two) times daily.) 90 capsule 5   hydrochlorothiazide (HYDRODIURIL) 25 MG tablet Take 0.5 tablets (12.5 mg total) by mouth daily.     hydrocortisone 2.5 % cream Apply topically daily as needed.     levothyroxine (SYNTHROID) 137 MCG tablet Take 1 tablet (137 mcg total) by mouth daily before breakfast. 90 tablet 1   meloxicam (MOBIC) 7.5 MG tablet TAKE 1 TABLET BY MOUTH EVERY DAY 30 tablet 0   methocarbamol (ROBAXIN) 750 MG tablet TAKE 1 TABLET BY MOUTH TWICE A DAY AS NEEDED FOR MUSCLE SPASM 60 tablet 0   nystatin cream (MYCOSTATIN) nystatin 100,000 unit/gram topical cream     simvastatin (ZOCOR) 40 MG tablet Take 1 tablet (40 mg total) by mouth at bedtime. 90 tablet 3   triamcinolone cream (KENALOG) 0.1 % MIX WITH NYSTATIN AND APPLY A THIN LAYER TO THE AFFECTED AREA TWICE  DAILY AS NEEDED     warfarin (COUMADIN) 5 MG tablet Take 1 tablet (5 mg total) by mouth daily. Or as directed by [facility] Clinic. 30 tablet 0   No current facility-administered medications for this visit.    Review of Systems  Constitutional:  Constitutional negative. HENT: HENT negative.  Eyes: Eyes negative.  Cardiovascular: Positive for leg swelling.  GI: Gastrointestinal negative.  Musculoskeletal: Musculoskeletal negative.  Skin: Skin negative.  Neurological: Neurological negative. Hematologic: Hematologic/lymphatic negative.  Psychiatric: Psychiatric negative.        Objective:  Objective   Vitals:   03/17/24 1424  BP: 129/83  Pulse: 75  Temp: 97.8 F (36.6 C)  Weight: 191 lb (86.6 kg)  Height: 5' (1.524 m)   Body mass index is 37.3 kg/m.  Physical Exam HENT:     Head: Normocephalic.     Nose: Nose normal.  Eyes:     Pupils: Pupils are equal, round, and reactive to light.  Pulmonary:     Effort: Pulmonary effort is normal.  Abdominal:     General: Abdomen is flat.  Musculoskeletal:     Right lower leg: Edema present.     Left lower leg: Edema present.     Comments: Compression stockings in place bilateral lower extremities  Skin:    Capillary Refill: Capillary refill takes less than 2 seconds.  Neurological:     General: No focal deficit present.     Mental Status: She is alert.     Data: CT venogram reviewed with patient and her sister     Assessment/Plan:     78 year old female with history of extensive bilateral lower extremity DVT.  DVT can be visualized in the proximal femoral vein on the right there is no other evidence of central DVT or obstructive process.  CT scan read will need to be followed up on and she will keep follow-up with Pervis Hocking at the DVT clinic and can see me on an as-needed basis.  She would benefit from referral to hematologist.     Maeola Harman MD [facility]

## 2024-03-18 ENCOUNTER — Ambulatory Visit (HOSPITAL_COMMUNITY)
Admission: RE | Admit: 2024-03-18 | Discharge: 2024-03-18 | Disposition: A | Discharge status: Home / Self Care | Source: Ambulatory Visit | Attending: Vascular Surgery | Admitting: Vascular Surgery

## 2024-03-18 DIAGNOSIS — Z7901 Long term (current) use of anticoagulants: Secondary | ICD-10-CM | POA: Diagnosis not present

## 2024-03-18 DIAGNOSIS — I82413 Acute embolism and thrombosis of femoral vein, bilateral: Secondary | ICD-10-CM | POA: Insufficient documentation

## 2024-03-18 LAB — POCT INR: INR: 2.3 (ref 2.0–3.0)

## 2024-03-18 MED ORDER — WARFARIN SODIUM 5 MG PO TABS: ORAL_TABLET | ORAL | 4 refills | Status: DC

## 2024-03-18 NOTE — Patient Instructions (Signed)
 Continue 7.5 mg (1 and a half tablets) on Mondays, Wednesdays, and Fridays and 5 mg (1 tablet) of warfarin all other days.   -[facility] You may need to call the pharmacy to ask them to fill this when you start to run low on your current supply.  -We have referred you to hematology. They will call you to schedule your appointment. -It is important to take your medication around the same time every day.  -Avoid NSAIDs like ibuprofen (Advil, Motrin) and naproxen (Aleve) as well as aspirin doses over 100 mg daily. -Tylenol (acetaminophen) is the preferred over the counter pain medication to lower the risk of bleeding. -Be sure to alert all of your health care providers that you are taking an anticoagulant prior to starting a new medication or having a procedure. -Monitor for signs and symptoms of bleeding (abnormal bruising, prolonged bleeding, nose bleeds, bleeding from gums, discolored urine, black tarry stools). If you have fallen and hit your head OR if your bleeding is severe or not stopping, seek emergency care.  -Go to the emergency room if emergent signs and symptoms of new clot occur (new or worse swelling and pain in an arm or leg, shortness of breath, chest pain, fast or irregular heartbeats, lightheadedness, dizziness, fainting, coughing up blood) or if you experience a significant color change (pale or blue) in the extremity that has the DVT.  -We recommend you wear compression stockings (20-30 mmHg) as long as you are having swelling or pain. Be sure to purchase the correct size and take them off at night.   Your next visit is on Tuesday April 22nd at 9:30am.  [facility] [address] [place], [place], [place] [address] Enter the hospital through Entrance C off [facility] and pull up to the [facility] entrance to the free valet parking.  Check in for your appointment at the [facility].   **As of April 12, 2024, our location is changing** We will be located at [facility] at:  [address], [address], [place], [place] [address] Phone number as of 04/12/24 will be [phone].   If you have any questions or need to reschedule an appointment, please call [phone] [facility] (active number until 04/12/24).  If you are having an emergency, call 911 or present to the nearest emergency room.   What is a DVT?  -Deep vein thrombosis (DVT) is a condition in which a blood clot forms in a vein of the deep venous system which can occur in the lower leg, thigh, pelvis, arm, or neck. This condition is serious and can be life-threatening if the clot travels to the arteries of the lungs and causing a blockage (pulmonary embolism, PE). A DVT can also damage veins in the leg, which can lead to long-term venous disease, leg pain, swelling, discoloration, and ulcers or sores (post-thrombotic syndrome).  -Treatment may include taking an anticoagulant medication to prevent more clots from forming and the current clot from growing, wearing compression stockings, and/or surgical procedures to remove or dissolve the clot.

## 2024-03-18 NOTE — Progress Notes (Signed)
 DVT Clinic Note  Name: Amanda Mathews     MRN: [id number]     DOB: 26-Sep-1946     Sex: female  PCP: Ardith Dark, MD  Today's Visit: Visit Information: Follow Up Visit  Referred to DVT Clinic by: Primary Care - Dr. [name] Referred to CPP by: Dr. Randie Heinz Reason for referral:  Chief Complaint  Patient presents with   Warfarin management   HISTORY OF PRESENT ILLNESS: Amanda Mathews is a 78 y.o. female with PMH HTN, HLD, hypothyroidism, osteoporosis, OSA, chronic back pain, trigeminal neuralgia on carbamazepine, who presents after diagnosis of DVT for medication management. Patient was seen by her PCP and reported bilateral lower extremity edema, right worse than left, along with pain for the prior month. Ultrasound showed bilateral acute DVTs and she was referred to DVT Clinic to initiate treatment. No prior personal or family history of VTE. Initially seen in DVT Clinic 02/24/24, initiated anticoagulation with warfarin and Lovenox bridge due to drug interaction with DOACs and carbamazepine. Dr. Randie Heinz was consulted and CT venogram was ordered. She followed up with Dr. Randie Heinz on 03/17/24 who reviewed her CT scan and he saw no evidence of central DVT or obstructive process. He recommended she see a hematologist. She continues to follow up in DVT Clinic for INR management.  Today, patient arrives in good spirits and is ambulating well with a walker. Continues to have no SOB, CP, palpitations. She reports that her pain and swelling have started to improve with elevation. She has purchased compression stockings and is wearing them today. It took her a few tries to find some that fit both of her legs, as she currently needs two different sizes. Denies abnormal bleeding or bruising. Denies missed doses of warfarin.   Positive Thrombotic Risk Factors: Older Age, Obesity Bleeding Risk Factors: Age >65 years, Anticoagulant therapy  Negative Thrombotic Risk Factors: Previous VTE, Recent surgery (within 3  months), Recent trauma (within 3 months), Recent admission to hospital with acute illness (within 3 months), Paralysis, paresis, or recent plaster cast immobilization of lower extremity, Central venous catheterization, Bed rest >72 hours within 3 months, Sedentary journey lasting >8 hours within 4 weeks, Pregnancy, Within 6 weeks postpartum, Recent cesarean section (within 3 months), Estrogen therapy, Testosterone therapy, Erythropoiesis-stimulating agent, Recent COVID diagnosis (within 3 months), Active cancer, Non-malignant, chronic inflammatory condition, Known thrombophilic condition, Smoking  Rx Insurance Coverage: Medicare Rx Affordability: Lovenox is $10/month. Warfarin is $4/month. Rx Assistance Provided:  None needed at this time Preferred Pharmacy: Lovenox and warfarin filled at [facility] during initial DVT visit. Refills of warfarin sent [facility] preferred [facility].   Past Medical History:  Diagnosis Date   Cataract    Osteoporosis    Sleep apnea    Thyroid disease     Past Surgical History:  Procedure Laterality Date   back stimulator      BUNIONECTOMY     CHOLECYSTECTOMY     FEMUR FRACTURE SURGERY     REPLACEMENT TOTAL KNEE BILATERAL Bilateral     Social History   Socioeconomic History   Marital status: Divorced    Spouse name: Not on file   Number of children: Not on file   Years of education: Not on file   Highest education level: Associate degree: occupational, Scientist, product/process development, or vocational program  Occupational History   Occupation: Retired    Occupation: retired    Comment: [employer]  Tobacco Use   Smoking status: Never   Smokeless tobacco:  Never  Vaping Use   Vaping status: Never Used  Substance and Sexual Activity   Alcohol use: Never   Drug use: Never   Sexual activity: Not Currently    Birth control/protection: Abstinence  Other Topics Concern   Not on file  Social History Narrative   Right handed   Live alone 3 stairs to door   Caffeine 2 times  daily   Social Drivers of Health   Financial Resource Strain: Low Risk  (03/09/2024)   Overall Financial Resource Strain (CARDIA)    Difficulty of Paying Living Expenses: Not hard at all  Food Insecurity: Low Risk  (03/16/2024)   Received from [facility]   Hunger Vital Sign    Worried About Running Out of Food in the Last Year: Never true    Ran Out of Food in the Last Year: Never true  Transportation Needs: No Transportation Needs (03/16/2024)   Received from [facility]    In the past 12 months, has lack of reliable transportation kept you from medical appointments, meetings, work or from getting things needed for daily living? : No  Physical Activity: Insufficiently Active (03/09/2024)   Exercise Vital Sign    Days of Exercise per Week: 2 days    Minutes of Exercise per Session: 30 min  Stress: No Stress Concern Present (03/09/2024)   [employer] of Occupational Health - Occupational Stress Questionnaire    Feeling of Stress : Not at all  Social Connections: Moderately Integrated (03/09/2024)   Social Connection and Isolation Panel [NHANES]    Frequency of Communication with Friends and Family: More than three times a week    Frequency of Social Gatherings with Friends and Family: More than three times a week    Attends Religious Services: More than 4 times per year    Active Member of Clubs or Organizations: Yes    Attends Banker Meetings: More than 4 times per year    Marital Status: Divorced  Intimate Partner Violence: Not At Risk (11/05/2023)   Humiliation, Afraid, Rape, and Kick questionnaire    Fear of Current or Ex-Partner: No    Emotionally Abused: No    Physically Abused: No    Sexually Abused: No    Family History  Problem Relation Age of Onset   Hypertension Mother    Stroke Mother    Arthritis Sister    COPD Sister    Depression Sister    Diabetes Sister    Alcohol abuse Brother    Hypertension Sister     Allergies as  of 03/18/2024 - Review Complete 03/17/2024  Allergen Reaction Noted   Codeine Nausea And Vomiting and Nausea Only 05/11/2018   Hydrocodone Other (See Comments) 11/05/2023   Hydromorphone Nausea Only and Nausea And Vomiting 05/11/2018   Morphine Nausea Only and Other (See Comments) 05/11/2018   Oxycodone Other (See Comments) 11/05/2023   Ropinirole  10/06/2020    Current Outpatient Medications on File Prior to Encounter  Medication Sig Dispense Refill   acetaminophen (TYLENOL) 500 MG tablet Take 500 mg by mouth every 6 (six) hours as needed for mild pain.     ammonium lactate (AMLACTIN) 12 % cream APPLY THIN COAT TO AFFECTED AREAS & RUB IN TWICE A DAY IN THE MORNING & IN THE EVENING     benzonatate (TESSALON PERLES) 100 MG capsule Take 1 capsule (100 mg total) by mouth 3 (three) times daily as needed. 30 capsule 1   Calcium Carb-Cholecalciferol (  CALCIUM 600-D PO) Take 600 mg by mouth daily.     carbamazepine (TEGRETOL) 200 MG tablet Take 200 mg by mouth 3 (three) times daily.     Cholecalciferol (VITAMIN D3) 125 MCG (5000 UT) CAPS Take 5,000 Units by mouth daily.     denosumab (PROLIA) 60 MG/ML SOSY injection Inject 60 mg into the skin every 6 (six) months.     gabapentin (NEURONTIN) 300 MG capsule TAKE 1 CAPSULE BY MOUTH THREE TIMES A DAY (Patient taking differently: Take 300 mg by mouth 2 (two) times daily.) 90 capsule 5   hydrochlorothiazide (HYDRODIURIL) 25 MG tablet Take 0.5 tablets (12.5 mg total) by mouth daily.     hydrocortisone 2.5 % cream Apply topically daily as needed.     levothyroxine (SYNTHROID) 137 MCG tablet Take 1 tablet (137 mcg total) by mouth daily before breakfast. 90 tablet 1   meloxicam (MOBIC) 7.5 MG tablet TAKE 1 TABLET BY MOUTH EVERY DAY 30 tablet 0   methocarbamol (ROBAXIN) 750 MG tablet TAKE 1 TABLET BY MOUTH TWICE A DAY AS NEEDED FOR MUSCLE SPASM 60 tablet 0   nystatin cream (MYCOSTATIN) nystatin 100,000 unit/gram topical cream     simvastatin (ZOCOR) 40 MG  tablet Take 1 tablet (40 mg total) by mouth at bedtime. 90 tablet 3   triamcinolone cream (KENALOG) 0.1 % MIX WITH NYSTATIN AND APPLY A THIN LAYER TO THE AFFECTED AREA TWICE DAILY AS NEEDED     No current facility-administered medications on file prior to encounter.   REVIEW OF SYSTEMS:  Review of Systems  Respiratory:  Negative for shortness of breath.   Cardiovascular:  Positive for leg swelling. Negative for chest pain and palpitations.  Musculoskeletal:  Positive for myalgias.  Neurological:  Positive for tingling. Negative for dizziness.   PHYSICAL EXAMINATION:  There were no vitals filed for this visit.  Physical Exam Vitals reviewed.  Cardiovascular:     Rate and Rhythm: Normal rate.  Pulmonary:     Effort: Pulmonary effort is normal.  Musculoskeletal:        General: No tenderness.     Right lower leg: Edema present.     Left lower leg: Edema present.  Skin:    Findings: Erythema present. No bruising.  Psychiatric:        Mood and Affect: Mood normal.        Behavior: Behavior normal.        Thought Content: Thought content normal.   Villalta Score for Post-Thrombotic Syndrome: Pain: Mild Cramps: Mild Heaviness: Mild Paresthesia: Mild Pruritus: Absent Pretibial Edema: Moderate Skin Induration: Absent Hyperpigmentation: Absent Redness: Absent Venous Ectasia: Absent Pain on calf compression: Absent Villalta Preliminary Score: 6 Is venous ulcer present?: No If venous ulcer is present and score is <15, then 15 points total are assigned: Absent Villalta Total Score: 6  LABS:  CBC     Component Value Date/Time   WBC 5.9 02/24/2024 0906   RBC 4.24 02/24/2024 0906   HGB 13.6 02/24/2024 0906   HCT 40.6 02/24/2024 0906   PLT 199.0 02/24/2024 0906   MCV 95.7 02/24/2024 0906   MCH 30.7 02/19/2022 0605   MCHC 33.6 02/24/2024 0906   RDW 13.2 02/24/2024 0906   LYMPHSABS 0.7 02/19/2022 0605   MONOABS 0.7 02/19/2022 0605   EOSABS 0.1 02/19/2022 0605   BASOSABS  0.1 02/19/2022 0605    Hepatic Function      Component Value Date/Time   PROT 6.4 02/24/2024 0906   ALBUMIN 4.1 02/24/2024 0906  AST 15 02/24/2024 0906   ALT 11 02/24/2024 0906   ALKPHOS 115 02/24/2024 0906   BILITOT 0.5 02/24/2024 0906    Renal Function   Lab Results  Component Value Date   CREATININE 0.96 02/24/2024   CREATININE 0.86 03/20/2023   CREATININE 0.98 05/06/2022    CrCl cannot be calculated (Patient's most recent lab result is older than the maximum 21 days allowed.).   [facility] Vascular Lab Studies:  02/24/24 VAS [place] LOWER EXTREMITY VENOUS (DVT)  Summary:  RIGHT:  - Findings consistent with acute deep vein thrombosis involving the right  common femoral vein, right femoral vein, right popliteal vein, and TPT.  - All other veins visualized appear fully compressible and demonstrate  appropriate Doppler characteristics.    LEFT:  - Findings consistent with acute deep vein thrombosis involving the left  femoral vein, and left popliteal vein.  - All other veins visualized appear fully compressible and demonstrate  appropriate Doppler characteristics.   Right Technical Findings:  Unable to visualize the posterior tibial and peroneal veins due to body  habitus and increase lower leg swelling.   Left Technical Findings:  Unable to visualize the posterior tibial and peroneal veins due to body  habitus and increase lower leg swelling.   ASSESSMENT: Location of DVT: Right common femoral vein, Left femoral vein, Right femoral vein, Left popliteal vein, Right popliteal vein, Right distal vein  Patient without prior history of VTE diagnosed with bilateral DVTs 02/24/24. Due to involvement of the right common femoral vein and presence of extensive bilateral DVTs, discussed patient with Dr. Randie Heinz during her initial DVT Clinic visit. Patient's extended travel (8 hour in car) on 02/23/24 is unlikely to be the primary provoking cause, as her RLE edema had been present over one month,  and her last travel prior to that was many months ago, but the drive could have contributed to worsening of symptoms. Per patient, LLE edema was at baseline. She had some discomfort in both legs but does not describe them as painful. Dr. Randie Heinz recommended starting anticoagulation and he would arrange for her to follow up with him in the next few weeks for further workup including CT venogram. She followed up with Dr. Randie Heinz on 03/17/24 who reviewed her CT scan and did not see any central DVT or obstructive process though we are awaiting radiology's read on this. He recommended referral to hematology.   Due to drug interaction with carbamazepine, DOACs are contraindicated and patient requires anticoagulation with warfarin with Lovenox bridge until INR >2. CBC WNL on 02/24/24. She completed her Lovenox bridge as of 03/02/24. Carbamazepine induces warfarin metabolism which decreases warfarin concentrations, but carbamazepine is also an auto-inducer of its own metabolism, which can further decrease INRs, even weeks after starting warfarin. As a result, patient will require very close INR monitoring to maintain INR goal 2-3.   INR today is now therapeutic at 2.3 after a slight dose increase at her last visit. Will continue this dose. Her swelling has started to improve with compression and elevation.   PLAN: -Continue 7.5 mg (1 and a half tablets) on Mondays, Wednesdays, and Fridays and 5 mg (1 tablet) of warfarin all other days.  -Expected duration of therapy: pending further workup. Therapy started on 02/24/24. -Patient educated on purpose, proper use and potential adverse effects of warfarin (Coumadin). -Counseled on dietary and drug interactions of warfarin. -Discussed importance of taking medication around the same time every day. -Advised patient of medications to avoid (NSAIDs, aspirin doses >  100 mg daily). -Educated that Tylenol (acetaminophen) is the preferred analgesic to lower the risk of  bleeding. -Advised patient to alert all providers of anticoagulation therapy prior to starting a new medication or having a procedure. -Emphasized importance of monitoring for signs and symptoms of bleeding (abnormal bruising, prolonged bleeding, nose bleeds, bleeding from gums, discolored urine, black tarry stools). -Educated patient to present to the ED if emergent signs and symptoms of new thrombosis occur. -Counseled patient to wear compression stockings daily, removing at night. Elevate legs to help improve swelling.   Follow up: 2 weeks for next INR check in DVT Clinic. Referred to hematology.   Pervis Hocking, PharmD, [place], CPP [facility]rombosis Clinic Clinical Pharmacist Practitioner

## 2024-03-22 ENCOUNTER — Other Ambulatory Visit: Payer: Self-pay | Admitting: Family Medicine

## 2024-03-28 ENCOUNTER — Other Ambulatory Visit: Payer: Self-pay | Admitting: Family Medicine

## 2024-04-06 ENCOUNTER — Ambulatory Visit (HOSPITAL_COMMUNITY)
Admission: RE | Admit: 2024-04-06 | Discharge: 2024-04-06 | Disposition: A | Discharge status: Home / Self Care | Source: Ambulatory Visit | Attending: Vascular Surgery | Admitting: Vascular Surgery

## 2024-04-06 DIAGNOSIS — I82413 Acute embolism and thrombosis of femoral vein, bilateral: Secondary | ICD-10-CM | POA: Insufficient documentation

## 2024-04-06 DIAGNOSIS — Z7901 Long term (current) use of anticoagulants: Secondary | ICD-10-CM | POA: Insufficient documentation

## 2024-04-06 LAB — POCT INR: INR: 1.6 — AB (ref 2.0–3.0)

## 2024-04-06 NOTE — Progress Notes (Signed)
 DVT Clinic Note  Name: Amanda Mathews     MRN: [id number]     DOB: May 24, 1946     Sex: female  PCP: Rodney Clamp, MD  Today's Visit: Visit Information: Follow Up Visit  Referred to DVT Clinic by: Primary Care - Dr. Daneil Dunker Referred to CPP by: Dr. Vikki Graves Reason for referral:  Chief Complaint  Patient presents with   Warfarin management   HISTORY OF PRESENT ILLNESS: Amanda Mathews is a 78 y.o. female with PMH HTN, HLD, hypothyroidism, osteoporosis, OSA, chronic back pain, trigeminal neuralgia on carbamazepine , who presents after diagnosis of DVT for medication management. Patient was seen by her PCP and reported bilateral lower extremity edema, right worse than left, along with pain for the prior month. Ultrasound showed bilateral acute DVTs and she was referred to DVT Clinic to initiate treatment. No prior personal or family history of VTE. Initially seen in DVT Clinic 02/24/24, initiated anticoagulation with warfarin and Lovenox  bridge due to drug interaction with DOACs and carbamazepine . Dr. Vikki Graves was consulted and CT venogram was ordered. She followed up with Dr. Vikki Graves on 03/17/24 who reviewed her CT scan and he saw no evidence of central DVT or obstructive process. He recommended she see a hematologist. She continues to follow up in DVT Clinic for INR management.  Today, patient arrives in good spirits and is ambulating well with a walker. Continues to have no SOB, CP, palpitations. She reports that her pain and swelling had been improving consistently but she woke up today with worsening pain and swelling in her right lower leg. She put her compression stockings on which is starting to help. Denies abnormal bleeding or bruising. Denies missed or extra doses of warfarin. No changes in medications.  Positive Thrombotic Risk Factors: Obesity, Older Age Bleeding Risk Factors: Age >65 years, Anticoagulant therapy  Negative Thrombotic Risk Factors: Previous VTE, Recent surgery (within 3  months), Recent trauma (within 3 months), Recent admission to hospital with acute illness (within 3 months), Paralysis, paresis, or recent plaster cast immobilization of lower extremity, Central venous catheterization, Bed rest >72 hours within 3 months, Sedentary journey lasting >8 hours within 4 weeks, Pregnancy, Within 6 weeks postpartum, Recent COVID diagnosis (within 3 months), Recent cesarean section (within 3 months), Estrogen therapy, Testosterone therapy, Erythropoiesis-stimulating agent, Active cancer, Non-malignant, chronic inflammatory condition, Known thrombophilic condition, Smoking  Rx Insurance Coverage: Medicare Rx Affordability: Lovenox  is $10/month. Warfarin is $4/month. Rx Assistance Provided:  None needed at this time Preferred Pharmacy: Lovenox  and warfarin filled at [facility] during initial DVT visit. Refills of warfarin sent to [facility] preferred [facility].   Past Medical History:  Diagnosis Date   Cataract    Osteoporosis    Sleep apnea    Thyroid  disease     Past Surgical History:  Procedure Laterality Date   back stimulator      BUNIONECTOMY     CHOLECYSTECTOMY     FEMUR FRACTURE SURGERY     REPLACEMENT TOTAL KNEE BILATERAL Bilateral     Social History   Socioeconomic History   Marital status: Divorced    Spouse name: Not on file   Number of children: Not on file   Years of education: Not on file   Highest education level: Associate degree: occupational, Scientist, product/process development, or vocational program  Occupational History   Occupation: Retired    Occupation: retired    Comment: [employer]  Tobacco Use   Smoking status: Never   Smokeless tobacco: Never  Vaping  Use   Vaping status: Never Used  Substance and Sexual Activity   Alcohol use: Never   Drug use: Never   Sexual activity: Not Currently    Birth control/protection: Abstinence  Other Topics Concern   Not on file  Social History Narrative   Right handed   Live alone 3 stairs to door   Caffeine 2 times  daily   Social Drivers of Health   Financial Resource Strain: Low Risk  (03/09/2024)   Overall Financial Resource Strain (CARDIA)    Difficulty of Paying Living Expenses: Not hard at all  Food Insecurity: Low Risk  (03/16/2024)   Received from [facility]   Hunger Vital Sign    Worried About Running Out of Food in the Last Year: Never true    Ran Out of Food in the Last Year: Never true  Transportation Needs: No Transportation Needs (03/16/2024)   Received from [facility]    In the past 12 months, has lack of reliable transportation kept you from medical appointments, meetings, work or from getting things needed for daily living? : No  Physical Activity: Insufficiently Active (03/09/2024)   Exercise Vital Sign    Days of Exercise per Week: 2 days    Minutes of Exercise per Session: 30 min  Stress: No Stress Concern Present (03/09/2024)   [employer] of Occupational Health - Occupational Stress Questionnaire    Feeling of Stress : Not at all  Social Connections: Moderately Integrated (03/09/2024)   Social Connection and Isolation Panel [NHANES]    Frequency of Communication with Friends and Family: More than three times a week    Frequency of Social Gatherings with Friends and Family: More than three times a week    Attends Religious Services: More than 4 times per year    Active Member of Clubs or Organizations: Yes    Attends Banker Meetings: More than 4 times per year    Marital Status: Divorced  Intimate Partner Violence: Not At Risk (11/05/2023)   Humiliation, Afraid, Rape, and Kick questionnaire    Fear of Current or Ex-Partner: No    Emotionally Abused: No    Physically Abused: No    Sexually Abused: No    Family History  Problem Relation Age of Onset   Hypertension Mother    Stroke Mother    Arthritis Sister    COPD Sister    Depression Sister    Diabetes Sister    Alcohol abuse Brother    Hypertension Sister     Allergies as  of 04/06/2024 - Review Complete 04/06/2024  Allergen Reaction Noted   Codeine Nausea And Vomiting and Nausea Only 05/11/2018   Hydrocodone  Other (See Comments) 11/05/2023   Hydromorphone Nausea Only and Nausea And Vomiting 05/11/2018   Morphine Nausea Only and Other (See Comments) 05/11/2018   Oxycodone  Other (See Comments) 11/05/2023   Ropinirole  10/06/2020    Current Outpatient Medications on File Prior to Encounter  Medication Sig Dispense Refill   simvastatin  (ZOCOR ) 20 MG tablet Take 20 mg by mouth at bedtime.     warfarin (COUMADIN ) 5 MG tablet Take 7.5 mg (1 and a half tablets) on Mon, Wed, Fri, and 5 mg (1 tablet) all other days. Or as directed by DVT Clinic. 45 tablet 4   acetaminophen  (TYLENOL ) 500 MG tablet Take 500 mg by mouth every 6 (six) hours as needed for mild pain.     ammonium lactate (AMLACTIN) 12 % cream APPLY  THIN COAT TO AFFECTED AREAS & RUB IN TWICE A DAY IN THE MORNING & IN THE EVENING     benzonatate  (TESSALON  PERLES) 100 MG capsule Take 1 capsule (100 mg total) by mouth 3 (three) times daily as needed. 30 capsule 1   Calcium Carb-Cholecalciferol (CALCIUM 600-D PO) Take 600 mg by mouth daily.     carbamazepine  (TEGRETOL ) 200 MG tablet Take 200 mg by mouth 3 (three) times daily.     Cholecalciferol (VITAMIN D3) 125 MCG (5000 UT) CAPS Take 5,000 Units by mouth daily.     denosumab  (PROLIA ) 60 MG/ML SOSY injection Inject 60 mg into the skin every 6 (six) months.     gabapentin (NEURONTIN) 300 MG capsule TAKE 1 CAPSULE BY MOUTH THREE TIMES A DAY (Patient taking differently: Take 300 mg by mouth 2 (two) times daily.) 90 capsule 5   hydrochlorothiazide  (HYDRODIURIL ) 25 MG tablet Take 0.5 tablets (12.5 mg total) by mouth daily.     hydrocortisone 2.5 % cream Apply topically daily as needed.     levothyroxine  (SYNTHROID ) 137 MCG tablet TAKE 1 TABLET BY MOUTH DAILY BEFORE BREAKFAST. 90 tablet 1   meloxicam  (MOBIC ) 7.5 MG tablet TAKE 1 TABLET BY MOUTH EVERY DAY 30 tablet 0    methocarbamol  (ROBAXIN ) 750 MG tablet TAKE 1 TABLET BY MOUTH TWICE A DAY AS NEEDED FOR MUSCLE SPASM 60 tablet 0   nystatin cream (MYCOSTATIN) nystatin 100,000 unit/gram topical cream     triamcinolone cream (KENALOG) 0.1 % MIX WITH NYSTATIN AND APPLY A THIN LAYER TO THE AFFECTED AREA TWICE DAILY AS NEEDED     No current facility-administered medications on file prior to encounter.   REVIEW OF SYSTEMS:  Review of Systems  Respiratory:  Negative for shortness of breath.   Cardiovascular:  Positive for leg swelling. Negative for chest pain and palpitations.  Musculoskeletal:  Positive for myalgias.  Neurological:  Positive for tingling. Negative for dizziness.   PHYSICAL EXAMINATION:  There were no vitals filed for this visit.  Physical Exam Vitals reviewed.  Cardiovascular:     Rate and Rhythm: Normal rate.  Pulmonary:     Effort: Pulmonary effort is normal.  Musculoskeletal:        General: No tenderness.     Right lower leg: Edema present.     Left lower leg: Edema present.  Skin:    Findings: Erythema present. No bruising.  Psychiatric:        Mood and Affect: Mood normal.        Behavior: Behavior normal.        Thought Content: Thought content normal.   Villalta Score for Post-Thrombotic Syndrome: Pain: Moderate Cramps: Moderate Heaviness: Moderate Paresthesia: Moderate Pruritus: Moderate Pretibial Edema: Moderate Skin Induration: Absent Hyperpigmentation: Absent Redness: Absent Venous Ectasia: Absent Pain on calf compression: Mild Villalta Preliminary Score: 13 Is venous ulcer present?: No If venous ulcer is present and score is <15, then 15 points total are assigned: Absent Villalta Total Score: 13  LABS:  CBC     Component Value Date/Time   WBC 5.9 02/24/2024 0906   RBC 4.24 02/24/2024 0906   HGB 13.6 02/24/2024 0906   HCT 40.6 02/24/2024 0906   PLT 199.0 02/24/2024 0906   MCV 95.7 02/24/2024 0906   MCH 30.7 02/19/2022 0605   MCHC 33.6 02/24/2024  0906   RDW 13.2 02/24/2024 0906   LYMPHSABS 0.7 02/19/2022 0605   MONOABS 0.7 02/19/2022 0605   EOSABS 0.1 02/19/2022 0605   BASOSABS 0.1 02/19/2022  2440    Hepatic Function      Component Value Date/Time   PROT 6.4 02/24/2024 0906   ALBUMIN 4.1 02/24/2024 0906   AST 15 02/24/2024 0906   ALT 11 02/24/2024 0906   ALKPHOS 115 02/24/2024 0906   BILITOT 0.5 02/24/2024 0906    Renal Function   Lab Results  Component Value Date   CREATININE 0.96 02/24/2024   CREATININE 0.86 03/20/2023   CREATININE 0.98 05/06/2022    CrCl cannot be calculated (Patient's most recent lab result is older than the maximum 21 days allowed.).   [facility] Vascular Lab Studies:  02/24/24 VAS US  LOWER EXTREMITY VENOUS (DVT)  Summary:  RIGHT:  - Findings consistent with acute deep vein thrombosis involving the right  common femoral vein, right femoral vein, right popliteal vein, and TPT.  - All other veins visualized appear fully compressible and demonstrate  appropriate Doppler characteristics.    LEFT:  - Findings consistent with acute deep vein thrombosis involving the left  femoral vein, and left popliteal vein.  - All other veins visualized appear fully compressible and demonstrate  appropriate Doppler characteristics.   Right Technical Findings:  Unable to visualize the posterior tibial and peroneal veins due to body  habitus and increase lower leg swelling.   Left Technical Findings:  Unable to visualize the posterior tibial and peroneal veins due to body  habitus and increase lower leg swelling.   ASSESSMENT: Location of DVT: Right common femoral vein, Left femoral vein, Right femoral vein, Left popliteal vein, Right popliteal vein, Right distal vein  Patient without prior history of VTE diagnosed with bilateral DVTs 02/24/24. Due to involvement of the right common femoral vein and presence of extensive bilateral DVTs, discussed patient with Dr. Vikki Graves during her initial DVT Clinic visit. Patient's  extended travel (8 hour in car) on 02/23/24 is unlikely to be the primary provoking cause, as her RLE edema had been present over one month, and her last travel prior to that was many months ago, but the drive could have contributed to worsening of symptoms. Per patient, LLE edema was at baseline. She had some discomfort in both legs but does not describe them as painful. Dr. Vikki Graves recommended starting anticoagulation and he would arrange for her to follow up with him in the next few weeks for further workup including CT venogram. She followed up with Dr. Vikki Graves on 03/17/24 and there was no central DVT or obstructive process seen on CT. He recommended referral to hematology, and her appointment is scheduling for 04/16/24.   Due to drug interaction with carbamazepine , DOACs are contraindicated and patient requires anticoagulation with warfarin with Lovenox  bridge until INR >2. CBC WNL on 02/24/24. She completed her Lovenox  bridge as of 03/02/24. Carbamazepine  induces warfarin metabolism which decreases warfarin concentrations. As a result, patient will require very close INR monitoring to maintain INR goal 2-3.   INR today is subtherapeutic at 1.6. No missed doses or new medications. May be related to eating multiple low vitamin K foods over the weekend for Easter (multiple iceberg salads, corn, sweet potatoes, butter beans). Individually would not think these would have a significant effect on INR but could possibly be contributing together. Her swelling had been consistently improving but today she had a worsening in pain and swelling in her right lower leg. Because INR is 1.6 and corresponded with worsening symptoms, will conservatively have her give one dose of Lovenox  (1.5 mg/kg/syringe which she already has on hand) today since it will take  a day or two of boosting for her to return to therapeutic INR range. She is agreeable to this plan.   PLAN: -Take one dose of Lovenox  150 mg today. Take 10 mg (2 tablets) today  and tomorrow, then continue 7.5 mg (1 and a half tablets) on Mondays, Wednesdays, and Fridays and 5 mg (1 tablet) of warfarin all other days.  -Expected duration of therapy: pending further workup. Therapy started on 02/24/24. -Patient educated on purpose, proper use and potential adverse effects of warfarin (Coumadin ). -Counseled on dietary and drug interactions of warfarin. -Discussed importance of taking medication around the same time every day. -Advised patient of medications to avoid (NSAIDs, aspirin doses >100 mg daily). -Educated that Tylenol  (acetaminophen ) is the preferred analgesic to lower the risk of bleeding. -Advised patient to alert all providers of anticoagulation therapy prior to starting a new medication or having a procedure. -Emphasized importance of monitoring for signs and symptoms of bleeding (abnormal bruising, prolonged bleeding, nose bleeds, bleeding from gums, discolored urine, black tarry stools). -Educated patient to present to the ED if emergent signs and symptoms of new thrombosis occur. -Counseled patient to wear compression stockings daily, removing at night. Elevate legs to help improve swelling.   Follow up: 2 weeks for next INR check in DVT Clinic. Referred to hematology, appt 04/16/24.   [name] Hoops, PharmD, [place], CPP Deep [facility]in Thrombosis Clinic Clinical Pharmacist Practitioner

## 2024-04-06 NOTE — Patient Instructions (Addendum)
 Take 1 shot of Lovenox  today. Take 10 mg (2 tablets) of warfarin today and tomorrow, then continue 7.5 mg (1 and a half tablets) on Mondays, Wednesdays, and Fridays and 5 mg (1 tablet) of warfarin all other days.   Your next visit is on Tuesday, May 6th at 10am.   **As of April 12, 2024, our location is changing** We will be located at [facility] at:  [address], [address], [place], [place] [address] Phone number as of 04/12/24 will be [phone].   If you have any questions or need to reschedule an appointment, please call [phone] [facility] (active number until 04/12/24).  If you are having an emergency, call 911 or present to the nearest emergency room.   What is a DVT?  -Deep vein thrombosis (DVT) is a condition in which a blood clot forms in a vein of the deep venous system which can occur in the lower leg, thigh, pelvis, arm, or neck. This condition is serious and can be life-threatening if the clot travels to the arteries of the lungs and causing a blockage (pulmonary embolism, PE). A DVT can also damage veins in the leg, which can lead to long-term venous disease, leg pain, swelling, discoloration, and ulcers or sores (post-thrombotic syndrome).  -Treatment may include taking an anticoagulant medication to prevent more clots from forming and the current clot from growing, wearing compression stockings, and/or surgical procedures to remove or dissolve the clot.

## 2024-04-12 DIAGNOSIS — R319 Hematuria, unspecified: Secondary | ICD-10-CM | POA: Diagnosis not present

## 2024-04-12 DIAGNOSIS — R109 Unspecified abdominal pain: Secondary | ICD-10-CM | POA: Diagnosis not present

## 2024-04-14 ENCOUNTER — Other Ambulatory Visit: Payer: Self-pay | Admitting: Family Medicine

## 2024-04-14 ENCOUNTER — Encounter: Payer: Self-pay | Admitting: Physician Assistant

## 2024-04-15 ENCOUNTER — Telehealth: Payer: Self-pay

## 2024-04-15 NOTE — Telephone Encounter (Signed)
 Spoke with patient and confirmed appointment on 04/16/24

## 2024-04-15 NOTE — Telephone Encounter (Signed)
 Yes it is safe for her to take that medication.  Amanda Mathews. Daneil Dunker, MD 04/15/2024 12:46 PM

## 2024-04-16 ENCOUNTER — Inpatient Hospital Stay

## 2024-04-16 ENCOUNTER — Encounter: Payer: Self-pay | Admitting: Hematology and Oncology

## 2024-04-16 ENCOUNTER — Inpatient Hospital Stay: Attending: Hematology and Oncology | Admitting: Hematology and Oncology

## 2024-04-16 DIAGNOSIS — Z79899 Other long term (current) drug therapy: Secondary | ICD-10-CM | POA: Insufficient documentation

## 2024-04-16 DIAGNOSIS — M810 Age-related osteoporosis without current pathological fracture: Secondary | ICD-10-CM | POA: Diagnosis not present

## 2024-04-16 DIAGNOSIS — Z79890 Hormone replacement therapy (postmenopausal): Secondary | ICD-10-CM | POA: Diagnosis not present

## 2024-04-16 DIAGNOSIS — Z791 Long term (current) use of non-steroidal anti-inflammatories (NSAID): Secondary | ICD-10-CM | POA: Insufficient documentation

## 2024-04-16 DIAGNOSIS — Z823 Family history of stroke: Secondary | ICD-10-CM | POA: Insufficient documentation

## 2024-04-16 DIAGNOSIS — Z96653 Presence of artificial knee joint, bilateral: Secondary | ICD-10-CM | POA: Insufficient documentation

## 2024-04-16 DIAGNOSIS — Z9049 Acquired absence of other specified parts of digestive tract: Secondary | ICD-10-CM | POA: Insufficient documentation

## 2024-04-16 DIAGNOSIS — I82413 Acute embolism and thrombosis of femoral vein, bilateral: Secondary | ICD-10-CM

## 2024-04-16 DIAGNOSIS — I82403 Acute embolism and thrombosis of unspecified deep veins of lower extremity, bilateral: Secondary | ICD-10-CM | POA: Insufficient documentation

## 2024-04-16 DIAGNOSIS — R252 Cramp and spasm: Secondary | ICD-10-CM | POA: Insufficient documentation

## 2024-04-16 DIAGNOSIS — Z8249 Family history of ischemic heart disease and other diseases of the circulatory system: Secondary | ICD-10-CM | POA: Diagnosis not present

## 2024-04-16 DIAGNOSIS — Z7901 Long term (current) use of anticoagulants: Secondary | ICD-10-CM | POA: Insufficient documentation

## 2024-04-16 DIAGNOSIS — G500 Trigeminal neuralgia: Secondary | ICD-10-CM | POA: Diagnosis not present

## 2024-04-16 LAB — CBC WITH DIFFERENTIAL/PLATELET
Abs Immature Granulocytes: 0 10*3/uL (ref 0.00–0.07)
Basophils Absolute: 0 10*3/uL (ref 0.0–0.1)
Basophils Relative: 1 %
Eosinophils Absolute: 0.1 10*3/uL (ref 0.0–0.5)
Eosinophils Relative: 2 %
HCT: 39.8 % (ref 36.0–46.0)
Hemoglobin: 13.9 g/dL (ref 12.0–15.0)
Immature Granulocytes: 0 %
Lymphocytes Relative: 15 %
Lymphs Abs: 0.8 10*3/uL (ref 0.7–4.0)
MCH: 31.5 pg (ref 26.0–34.0)
MCHC: 34.9 g/dL (ref 30.0–36.0)
MCV: 90.2 fL (ref 80.0–100.0)
Monocytes Absolute: 0.5 10*3/uL (ref 0.1–1.0)
Monocytes Relative: 9 %
Neutro Abs: 4 10*3/uL (ref 1.7–7.7)
Neutrophils Relative %: 73 %
Platelets: 199 10*3/uL (ref 150–400)
RBC: 4.41 MIL/uL (ref 3.87–5.11)
RDW: 12.8 % (ref 11.5–15.5)
WBC: 5.4 10*3/uL (ref 4.0–10.5)
nRBC: 0 % (ref 0.0–0.2)

## 2024-04-16 LAB — ANTITHROMBIN III: AntiThromb III Func: 122 % — ABNORMAL HIGH (ref 75–120)

## 2024-04-16 NOTE — Progress Notes (Signed)
 [facility] CONSULT NOTE  Patient Care Team: Rodney Clamp, MD as PCP - General (Family Medicine)  CHIEF COMPLAINTS/PURPOSE OF CONSULTATION:  Bilateral LE DVT  ASSESSMENT & PLAN:  No problem-specific Assessment & Plan notes found for this encounter.  Orders Placed This Encounter  Procedures   CT Chest W Contrast    Standing Status:   Future    Expected Date:   04/27/2024    Expiration Date:   04/16/2025    If indicated for the ordered procedure, I authorize the administration of contrast media per Radiology protocol:   Yes    Does the patient have a contrast media/X-ray dye allergy?:   No    Preferred imaging location?:   External   Lupus anticoagulant panel    Standing Status:   Future    Number of Occurrences:   1    Expiration Date:   04/16/2025   Prothrombin gene mutation    Standing Status:   Future    Number of Occurrences:   1    Expiration Date:   04/16/2025   Factor 5 leiden    Standing Status:   Future    Number of Occurrences:   1    Expiration Date:   04/16/2025   Hexagonal Phospholipid Neutralization    Standing Status:   Future    Number of Occurrences:   1    Expiration Date:   04/16/2025   Cardiolipin antibodies, IgG, IgM, IgA    Standing Status:   Future    Number of Occurrences:   1    Expiration Date:   04/16/2025   Antithrombin III    Standing Status:   Future    Number of Occurrences:   1    Expiration Date:   04/16/2025   Protein C activity    Standing Status:   Future    Number of Occurrences:   1    Expiration Date:   04/16/2025   Protein S activity    Standing Status:   Future    Number of Occurrences:   1    Expiration Date:   04/16/2025   Beta-2-glycoprotein i abs, IgG/M/A    Standing Status:   Future    Number of Occurrences:   1    Expiration Date:   04/16/2025   NGS JAK2 E12-15/CALR/MPL   CBC with Differential/Platelet    Standing Status:   Standing    Number of Occurrences:   22    Expiration Date:   04/16/2025   PNH Profile (-High  Sensitivity)    Standing Status:   Future    Number of Occurrences:   1    Expiration Date:   04/16/2025   Assessment and Plan Assessment & Plan Deep vein thrombosis of both legs First DVT episode in March 2025, likely triggered by long-distance travel. No malignancy on CT abdomen/pelvis. Evaluating for clotting disorders and other causes.  - She is on warfarin for anticoagulation. - Order CT chest to evaluate for underlying causes of DVT. - Perform blood work to assess for clotting disorders. - Continue warfarin therapy as managed by Dr. Alyse July. - Coordinate CT chest with upcoming CT scan for kidneys on May 13th, if possible.  Trigeminal neuralgia Managed with carbamazepine  for neuropathic pain.  Osteoporosis Managed with biannual injections of Prolia .  Bilateral knee replacement Knee replacements performed 5-6 years ago, no complications reported.  Cholecystectomy Gallbladder removal, no current issues.  Follow-up Follow-up planned to review results of  CT chest and blood work. - Schedule follow-up appointment in about a month to review test results and determine further management of DVT.   HISTORY OF PRESENTING ILLNESS:  Amanda Mathews 78 y.o. female is here because of bilateral LE DVT  Discussed the use of AI scribe software for clinical note transcription with the patient, who gave verbal consent to proceed.  History of Present Illness Amanda Mathews "[name]" is a 78 year old female with a history of blood clots who presents with bilateral leg swelling and pain.  In March 2025, she experienced her first episode of blood clots affecting both legs from the thigh to the leg. The swelling in her legs has been persistent, with the left leg showing improvement since starting warfarin, while the right leg is about three-fourths better. She has been wearing compression stockings and elevating her legs to manage the swelling. She recalls traveling approximately 400 miles in  one day prior to the onset of the blood clots. No recent surgeries, falls, or hormone replacement therapy. She has a history of knee replacements about five to six years ago and a femur fracture, but no recent surgeries or injuries.  She experiences frequent leg cramps, which have been present both before and after the blood clot episode. The cramps have been less severe since starting morphine. She manages the cramps by 'getting up and stomping,' drinking water, and taking mustard, which provides some relief.  She is currently on warfarin, managed due to interactions with carbamazepine , which she takes for trigeminal neuralgia. She also receives a shot every six months for osteoporosis. Her past medical history includes a cholecystectomy, bilateral knee replacements, and a femur fracture.  No family history of blood clots or cancer. She has been up to date with her mammograms and colonoscopies. No unintentional weight loss, black stools, or blood in her stool. She intentionally lost weight by going to the gym but has since gained some back due to frequent medical appointments.  All other systems were reviewed with the patient and are negative.  MEDICAL HISTORY:  Past Medical History:  Diagnosis Date   Cataract    Osteoporosis    Sleep apnea    Thyroid  disease     SURGICAL HISTORY: Past Surgical History:  Procedure Laterality Date   back stimulator      BUNIONECTOMY     CHOLECYSTECTOMY     FEMUR FRACTURE SURGERY     REPLACEMENT TOTAL KNEE BILATERAL Bilateral     SOCIAL HISTORY: Social History   Socioeconomic History   Marital status: Divorced    Spouse name: Not on file   Number of children: Not on file   Years of education: Not on file   Highest education level: Associate degree: occupational, Scientist, product/process development, or vocational program  Occupational History   Occupation: Retired    Occupation: retired    Comment: [employer]  Tobacco Use   Smoking status: Never   Smokeless tobacco:  Never  Vaping Use   Vaping status: Never Used  Substance and Sexual Activity   Alcohol use: Never   Drug use: Never   Sexual activity: Not Currently    Birth control/protection: Abstinence  Other Topics Concern   Not on file  Social History Narrative   Right handed   Live alone 3 stairs to door   Caffeine 2 times daily   Social Drivers of Health   Financial Resource Strain: Low Risk  (03/09/2024)   Overall Financial Resource Strain (CARDIA)  Difficulty of Paying Living Expenses: Not hard at all  Food Insecurity: Low Risk  (03/16/2024)   Received from [facility]   Hunger Vital Sign    Worried About Running Out of Food in the Last Year: Never true    Ran Out of Food in the Last Year: Never true  Transportation Needs: No Transportation Needs (03/16/2024)   Received from [facility]    In the past 12 months, has lack of reliable transportation kept you from medical appointments, meetings, work or from getting things needed for daily living? : No  Physical Activity: Insufficiently Active (03/09/2024)   Exercise Vital Sign    Days of Exercise per Week: 2 days    Minutes of Exercise per Session: 30 min  Stress: No Stress Concern Present (03/09/2024)   [employer] of Occupational Health - Occupational Stress Questionnaire    Feeling of Stress : Not at all  Social Connections: Moderately Integrated (03/09/2024)   Social Connection and Isolation Panel [NHANES]    Frequency of Communication with Friends and Family: More than three times a week    Frequency of Social Gatherings with Friends and Family: More than three times a week    Attends Religious Services: More than 4 times per year    Active Member of [employer] or Organizations: Yes    Attends Engineer, structural: More than 4 times per year    Marital Status: Divorced  Intimate Partner Violence: Not At Risk (11/05/2023)   Humiliation, Afraid, Rape, and Kick questionnaire    Fear of Current or  Ex-Partner: No    Emotionally Abused: No    Physically Abused: No    Sexually Abused: No    FAMILY HISTORY: Family History  Problem Relation Age of Onset   Hypertension Mother    Stroke Mother    Arthritis Sister    COPD Sister    Depression Sister    Diabetes Sister    Alcohol abuse Brother    Hypertension Sister     ALLERGIES:  is allergic to codeine, hydrocodone , hydromorphone, morphine, oxycodone , and ropinirole.  MEDICATIONS:  Current Outpatient Medications  Medication Sig Dispense Refill   acetaminophen  (TYLENOL ) 500 MG tablet Take 500 mg by mouth every 6 (six) hours as needed for mild pain.     ammonium lactate (AMLACTIN) 12 % cream APPLY THIN COAT TO AFFECTED AREAS & RUB IN TWICE A DAY IN THE MORNING & IN THE EVENING     benzonatate  (TESSALON  PERLES) 100 MG capsule Take 1 capsule (100 mg total) by mouth 3 (three) times daily as needed. 30 capsule 1   Calcium Carb-Cholecalciferol (CALCIUM 600-D PO) Take 600 mg by mouth daily.     carbamazepine  (TEGRETOL ) 200 MG tablet Take 200 mg by mouth 3 (three) times daily.     Cholecalciferol (VITAMIN D3) 125 MCG (5000 UT) CAPS Take 5,000 Units by mouth daily.     denosumab  (PROLIA ) 60 MG/ML SOSY injection Inject 60 mg into the skin every 6 (six) months.     gabapentin (NEURONTIN) 300 MG capsule TAKE 1 CAPSULE BY MOUTH THREE TIMES A DAY (Patient taking differently: Take 300 mg by mouth 2 (two) times daily.) 90 capsule 5   hydrochlorothiazide  (HYDRODIURIL ) 25 MG tablet Take 0.5 tablets (12.5 mg total) by mouth daily.     hydrocortisone 2.5 % cream Apply topically daily as needed.     levothyroxine  (SYNTHROID ) 137 MCG tablet TAKE 1 TABLET BY MOUTH DAILY BEFORE BREAKFAST. 90 tablet  1   meloxicam  (MOBIC ) 7.5 MG tablet TAKE 1 TABLET BY MOUTH EVERY DAY 30 tablet 0   methocarbamol  (ROBAXIN ) 750 MG tablet TAKE 1 TABLET BY MOUTH TWICE A DAY AS NEEDED FOR MUSCLE SPASMS 60 tablet 0   nystatin cream (MYCOSTATIN) nystatin 100,000 unit/gram topical  cream     simvastatin  (ZOCOR ) 20 MG tablet Take 20 mg by mouth at bedtime.     triamcinolone cream (KENALOG) 0.1 % MIX WITH NYSTATIN AND APPLY A THIN LAYER TO THE AFFECTED AREA TWICE DAILY AS NEEDED     warfarin (COUMADIN ) 5 MG tablet Take 7.5 mg (1 and a half tablets) on Mon, Wed, Fri, and 5 mg (1 tablet) all other days. Or as directed by DVT Clinic. 45 tablet 4   No current facility-administered medications for this visit.     PHYSICAL EXAMINATION: ECOG PERFORMANCE STATUS: 0 - Asymptomatic  There were no vitals filed for this visit. There were no vitals filed for this visit.  GENERAL:alert, no distress and comfortable LYMPH:  no palpable lymphadenopathy in the cervical, axillary  LUNGS: clear to auscultation and percussion with normal breathing effort HEART: regular rate & rhythm and no murmurs and no lower extremity edema ABDOMEN:abdomen soft, non-tender and normal bowel sounds RLE swelling greater than LLE   LABORATORY DATA:  I have reviewed the data as listed Lab Results  Component Value Date   WBC 5.4 04/16/2024   HGB 13.9 04/16/2024   HCT 39.8 04/16/2024   MCV 90.2 04/16/2024   PLT 199 04/16/2024     Chemistry      Component Value Date/Time   NA 141 02/24/2024 0906   NA 147 06/08/2020 0000   K 3.6 02/24/2024 0906   CL 103 02/24/2024 0906   CO2 30 02/24/2024 0906   BUN 16 02/24/2024 0906   BUN 20 06/08/2020 0000   CREATININE 0.96 02/24/2024 0906   CREATININE 0.98 05/06/2022 1050   GLU 102 06/08/2020 0000      Component Value Date/Time   CALCIUM 9.2 02/24/2024 0906   ALKPHOS 115 02/24/2024 0906   AST 15 02/24/2024 0906   ALT 11 02/24/2024 0906   BILITOT 0.5 02/24/2024 0906       RADIOGRAPHIC STUDIES: I have personally reviewed the radiological images as listed and agreed with the findings in the report. No results found.  All questions were answered. The patient knows to call the clinic with any problems, questions or concerns. I spent 45 minutes in  the care of this patient including H and P, review of records, counseling and coordination of care.     Murleen Arms, MD 04/16/2024 1:18 PM

## 2024-04-17 LAB — CARDIOLIPIN ANTIBODIES, IGG, IGM, IGA
Anticardiolipin IgA: <9 U/mL (ref 0–11)
Anticardiolipin IgG: <9 GPL U/mL (ref 0–14)
Anticardiolipin IgM: <9 [MPL'U]/mL (ref 0–12)

## 2024-04-17 LAB — BETA-2-GLYCOPROTEIN I ABS, IGG/M/A
Beta-2 Glyco I IgG: <9 GPI IgG units (ref 0–20)
Beta-2-Glycoprotein I IgA: <9 GPI IgA units (ref 0–25)
Beta-2-Glycoprotein I IgM: <9 GPI IgM units (ref 0–32)

## 2024-04-18 LAB — LUPUS ANTICOAGULANT PANEL
DRVVT: 56.2 s — ABNORMAL HIGH (ref 0.0–47.0)
PTT Lupus Anticoagulant: 36.4 s (ref 0.0–43.5)

## 2024-04-18 LAB — PROTEIN S ACTIVITY: Protein S Activity: 34 % — ABNORMAL LOW (ref 63–140)

## 2024-04-18 LAB — DRVVT CONFIRM: dRVVT Confirm: 1.2 ratio (ref 0.8–1.2)

## 2024-04-18 LAB — PROTEIN C ACTIVITY: Protein C Activity: 91 % (ref 73–180)

## 2024-04-18 LAB — DRVVT MIX: dRVVT Mix: 47.6 s — ABNORMAL HIGH (ref 0.0–40.4)

## 2024-04-20 ENCOUNTER — Ambulatory Visit: Attending: Vascular Surgery | Admitting: Student-PharmD

## 2024-04-20 DIAGNOSIS — I82413 Acute embolism and thrombosis of femoral vein, bilateral: Secondary | ICD-10-CM | POA: Diagnosis not present

## 2024-04-20 DIAGNOSIS — Z7901 Long term (current) use of anticoagulants: Secondary | ICD-10-CM

## 2024-04-20 LAB — PNH PROFILE (-HIGH SENSITIVITY)

## 2024-04-20 LAB — POCT INR: INR: 1.5 — AB (ref 2.0–3.0)

## 2024-04-20 NOTE — Progress Notes (Signed)
 DVT Clinic Note  Name: Amanda Mathews     MRN: [id number]     DOB: May 10, 1946     Sex: female  PCP: Rodney Clamp, MD  Today's Visit: Visit Information: Follow Up Visit  Referred to DVT Clinic by: Primary Care - Dr. Daneil Dunker Referred to CPP by: Dr. Vikki Graves Reason for referral:  Chief Complaint  Patient presents with   Warfarin Management   HISTORY OF PRESENT ILLNESS: Amanda Mathews is a 78 y.o. female with PMH HTN, HLD, hypothyroidism, osteoporosis, OSA, chronic back pain, trigeminal neuralgia on carbamazepine , who presents after diagnosis of DVT for medication management. Patient was seen by her PCP and reported bilateral lower extremity edema, right worse than left, along with pain for the prior month. Ultrasound showed bilateral acute DVTs and she was referred to DVT Clinic to initiate treatment. No prior personal or family history of VTE. Initially seen in DVT Clinic 02/24/24, initiated anticoagulation with warfarin and Lovenox  bridge due to drug interaction with DOACs and carbamazepine . Dr. Vikki Graves was consulted and CT venogram was ordered. She followed up with Dr. Vikki Graves on 03/17/24 who reviewed her CT scan and he saw no evidence of central DVT or obstructive process. He recommended she see a hematologist. She continues to follow up in DVT Clinic for INR management.  Today, patient arrives in good spirits and is ambulating well with a walker. Continues to have no SOB, CP, palpitations. Denies abnormal bleeding or bruising. Denies missed or extra doses of warfarin. No changes in medications. She established with hematology on 04/16/24 and is awaiting lab results.   Positive Thrombotic Risk Factors: Obesity, Older Age Bleeding Risk Factors: Age >65 years, Anticoagulant therapy  Negative Thrombotic Risk Factors: Previous VTE, Recent surgery (within 3 months), Recent trauma (within 3 months), Recent admission to hospital with acute illness (within 3 months), Paralysis, paresis, or recent plaster  cast immobilization of lower extremity, Central venous catheterization, Bed rest >72 hours within 3 months, Sedentary journey lasting >8 hours within 4 weeks, Pregnancy, Within 6 weeks postpartum, Recent cesarean section (within 3 months), Estrogen therapy, Testosterone therapy, Erythropoiesis-stimulating agent, Recent COVID diagnosis (within 3 months), Active cancer, Non-malignant, chronic inflammatory condition, Known thrombophilic condition, Smoking  Rx Insurance Coverage: Medicare Rx Affordability: Lovenox  is $10/month. Warfarin is $4/month. Rx Assistance Provided:  None needed at this time Preferred Pharmacy: Lovenox  and warfarin filled at [facility] during initial DVT visit. Refills of warfarin sent [facility] preferred [facility].   Past Medical History:  Diagnosis Date   Cataract    Osteoporosis    Sleep apnea    Thyroid  disease     Past Surgical History:  Procedure Laterality Date   back stimulator      BUNIONECTOMY     CHOLECYSTECTOMY     FEMUR FRACTURE SURGERY     REPLACEMENT TOTAL KNEE BILATERAL Bilateral     Social History   Socioeconomic History   Marital status: Divorced    Spouse name: Not on file   Number of children: Not on file   Years of education: Not on file   Highest education level: Associate degree: occupational, Scientist, product/process development, or vocational program  Occupational History   Occupation: Retired    Occupation: retired    Comment: [employer]  Tobacco Use   Smoking status: Never   Smokeless tobacco: Never  Vaping Use   Vaping status: Never Used  Substance and Sexual Activity   Alcohol use: Never   Drug use: Never   Sexual  activity: Not Currently    Birth control/protection: Abstinence  Other Topics Concern   Not on file  Social History Narrative   Right handed   Live alone 3 stairs to door   Caffeine 2 times daily   Social Drivers of Health   Financial Resource Strain: Low Risk  (03/09/2024)   Overall Financial Resource Strain (CARDIA)    Difficulty  of Paying Living Expenses: Not hard at all  Food Insecurity: Low Risk  (03/16/2024)   Received from [facility]   Hunger Vital Sign    Worried About Running Out of Food in the Last Year: Never true    Ran Out of Food in the Last Year: Never true  Transportation Needs: No Transportation Needs (03/16/2024)   Received from [facility]    In the past 12 months, has lack of reliable transportation kept you from medical appointments, meetings, work or from getting things needed for daily living? : No  Physical Activity: Insufficiently Active (03/09/2024)   Exercise Vital Sign    Days of Exercise per Week: 2 days    Minutes of Exercise per Session: 30 min  Stress: No Stress Concern Present (03/09/2024)   [employer] of Occupational Health - Occupational Stress Questionnaire    Feeling of Stress : Not at all  Social Connections: Moderately Integrated (03/09/2024)   Social Connection and Isolation Panel [NHANES]    Frequency of Communication with Friends and Family: More than three times a week    Frequency of Social Gatherings with Friends and Family: More than three times a week    Attends Religious Services: More than 4 times per year    Active Member of Clubs or Organizations: Yes    Attends Banker Meetings: More than 4 times per year    Marital Status: Divorced  Intimate Partner Violence: Not At Risk (11/05/2023)   Humiliation, Afraid, Rape, and Kick questionnaire    Fear of Current or Ex-Partner: No    Emotionally Abused: No    Physically Abused: No    Sexually Abused: No    Family History  Problem Relation Age of Onset   Hypertension Mother    Stroke Mother    Arthritis Sister    COPD Sister    Depression Sister    Diabetes Sister    Alcohol abuse Brother    Hypertension Sister     Allergies as of 04/20/2024 - Review Complete 04/20/2024  Allergen Reaction Noted   Codeine Nausea And Vomiting and Nausea Only 05/11/2018   Hydrocodone  Other  (See Comments) 11/05/2023   Hydromorphone Nausea Only and Nausea And Vomiting 05/11/2018   Morphine Nausea Only and Other (See Comments) 05/11/2018   Oxycodone  Other (See Comments) 11/05/2023   Ropinirole  10/06/2020    Current Outpatient Medications on File Prior to Visit  Medication Sig Dispense Refill   tamsulosin (FLOMAX) 0.4 MG CAPS capsule Take 1 capsule by mouth daily.     warfarin (COUMADIN ) 5 MG tablet Take 7.5 mg (1 and a half tablets) on Mon, Wed, Fri, and 5 mg (1 tablet) all other days. Or as directed by DVT Clinic. 45 tablet 4   acetaminophen  (TYLENOL ) 500 MG tablet Take 500 mg by mouth every 6 (six) hours as needed for mild pain.     ammonium lactate (AMLACTIN) 12 % cream APPLY THIN COAT TO AFFECTED AREAS & RUB IN TWICE A DAY IN THE MORNING & IN THE EVENING     benzonatate  (TESSALON  PERLES)  100 MG capsule Take 1 capsule (100 mg total) by mouth 3 (three) times daily as needed. 30 capsule 1   Calcium Carb-Cholecalciferol (CALCIUM 600-D PO) Take 600 mg by mouth daily.     carbamazepine  (TEGRETOL ) 200 MG tablet Take 200 mg by mouth 3 (three) times daily.     Cholecalciferol (VITAMIN D3) 125 MCG (5000 UT) CAPS Take 5,000 Units by mouth daily.     denosumab  (PROLIA ) 60 MG/ML SOSY injection Inject 60 mg into the skin every 6 (six) months.     gabapentin (NEURONTIN) 300 MG capsule TAKE 1 CAPSULE BY MOUTH THREE TIMES A DAY (Patient taking differently: Take 300 mg by mouth 2 (two) times daily.) 90 capsule 5   hydrochlorothiazide  (HYDRODIURIL ) 25 MG tablet Take 0.5 tablets (12.5 mg total) by mouth daily.     hydrocortisone 2.5 % cream Apply topically daily as needed.     levothyroxine  (SYNTHROID ) 137 MCG tablet TAKE 1 TABLET BY MOUTH DAILY BEFORE BREAKFAST. 90 tablet 1   methocarbamol  (ROBAXIN ) 750 MG tablet TAKE 1 TABLET BY MOUTH TWICE A DAY AS NEEDED FOR MUSCLE SPASMS 60 tablet 0   nystatin cream (MYCOSTATIN) nystatin 100,000 unit/gram topical cream     simvastatin  (ZOCOR ) 20 MG tablet  Take 20 mg by mouth at bedtime.     triamcinolone cream (KENALOG) 0.1 % MIX WITH NYSTATIN AND APPLY A THIN LAYER TO THE AFFECTED AREA TWICE DAILY AS NEEDED     No current facility-administered medications on file prior to visit.   REVIEW OF SYSTEMS:  Review of Systems  Respiratory:  Negative for shortness of breath.   Cardiovascular:  Positive for leg swelling. Negative for chest pain and palpitations.  Musculoskeletal:  Positive for myalgias.  Neurological:  Positive for tingling. Negative for dizziness.   PHYSICAL EXAMINATION:  There were no vitals filed for this visit.  Physical Exam Vitals reviewed.  Cardiovascular:     Rate and Rhythm: Normal rate.  Pulmonary:     Effort: Pulmonary effort is normal.  Musculoskeletal:        General: No tenderness.     Right lower leg: Edema present.     Left lower leg: Edema present.  Skin:    Findings: Erythema present. No bruising.  Psychiatric:        Mood and Affect: Mood normal.        Behavior: Behavior normal.        Thought Content: Thought content normal.   Villalta Score for Post-Thrombotic Syndrome: Pain: Moderate Cramps: Moderate Heaviness: Moderate Paresthesia: Moderate Pruritus: Moderate Pretibial Edema: Moderate Skin Induration: Absent Hyperpigmentation: Absent Redness: Absent Venous Ectasia: Absent Pain on calf compression: Mild Villalta Preliminary Score: 13 Is venous ulcer present?: No If venous ulcer is present and score is <15, then 15 points total are assigned: Absent Villalta Total Score: 13  LABS:  CBC     Component Value Date/Time   WBC 5.4 04/16/2024 1238   RBC 4.41 04/16/2024 1238   HGB 13.9 04/16/2024 1238   HCT 39.8 04/16/2024 1238   PLT 199 04/16/2024 1238   MCV 90.2 04/16/2024 1238   MCH 31.5 04/16/2024 1238   MCHC 34.9 04/16/2024 1238   RDW 12.8 04/16/2024 1238   LYMPHSABS 0.8 04/16/2024 1238   MONOABS 0.5 04/16/2024 1238   EOSABS 0.1 04/16/2024 1238   BASOSABS 0.0 04/16/2024 1238     Hepatic Function      Component Value Date/Time   PROT 6.4 02/24/2024 0906   ALBUMIN 4.1 02/24/2024 0906  AST 15 02/24/2024 0906   ALT 11 02/24/2024 0906   ALKPHOS 115 02/24/2024 0906   BILITOT 0.5 02/24/2024 0906    Renal Function   Lab Results  Component Value Date   CREATININE 0.96 02/24/2024   CREATININE 0.86 03/20/2023   CREATININE 0.98 05/06/2022    CrCl cannot be calculated (Patient's most recent lab result is older than the maximum 21 days allowed.).   [facility] Vascular Lab Studies:  02/24/24 VAS US  LOWER EXTREMITY VENOUS (DVT)  Summary:  RIGHT:  - Findings consistent with acute deep vein thrombosis involving the right  common femoral vein, right femoral vein, right popliteal vein, and TPT.  - All other veins visualized appear fully compressible and demonstrate  appropriate Doppler characteristics.    LEFT:  - Findings consistent with acute deep vein thrombosis involving the left  femoral vein, and left popliteal vein.  - All other veins visualized appear fully compressible and demonstrate  appropriate Doppler characteristics.   Right Technical Findings:  Unable to visualize the posterior tibial and peroneal veins due to body  habitus and increase lower leg swelling.   Left Technical Findings:  Unable to visualize the posterior tibial and peroneal veins due to body  habitus and increase lower leg swelling.   ASSESSMENT: Location of DVT: Right common femoral vein, Left femoral vein, Right femoral vein, Left popliteal vein, Right popliteal vein, Right distal vein  Patient without prior history of VTE diagnosed with bilateral DVTs 02/24/24. Due to involvement of the right common femoral vein and presence of extensive bilateral DVTs, discussed patient with Dr. Vikki Graves during her initial DVT Clinic visit. Patient's extended travel (8 hour in car) on 02/23/24 is unlikely to be the primary provoking cause, as her RLE edema had been present over one month, and her last travel  prior to that was many months ago, but the drive could have contributed to worsening of symptoms. Per patient, LLE edema was at baseline. She had some discomfort in both legs but does not describe them as painful. Dr. Vikki Graves recommended starting anticoagulation and he would arrange for her to follow up with him in the next few weeks for further workup including CT venogram. She followed up with Dr. Vikki Graves on 03/17/24 and there was no central DVT or obstructive process seen on CT. He recommended referral to hematology, and her appointment is scheduling for 04/16/24.   Due to drug interaction with carbamazepine , DOACs are contraindicated and patient requires anticoagulation with warfarin with Lovenox  bridge until INR >2. CBC WNL on 02/24/24. She completed her Lovenox  bridge as of 03/02/24. Carbamazepine  induces warfarin metabolism which decreases warfarin concentrations. As a result, patient will require very close INR monitoring to maintain INR goal 2-3.   INR today remains subtherapeutic at 1.5 despite boosting. We suspected her previous low INR was related to eating multiple foods with vitamin K over Easter. She has had no greens or foods high in vitamin K since then. No missed doses or new interacting medications. Because INR is 1.5, will conservatively have her give one dose of Lovenox  (1.5 mg/kg/syringe which she already has on hand) today and tomorrow since it will take a day or two of boosting for her to return to therapeutic INR range. She is agreeable to this plan. Will also increase her weekly regimen since this is the second subtherapeutic INR despite boosting. Will recheck INR next week.   PLAN: -Take one dose of Lovenox  150 mg today and tomorrow. Take 10 mg (2 tablets) today and  tomorrow, then increase dose to 5 mg (1 tablet) on Sundays and Thursdays and 7.5 mg (1 and a half tablets) of warfarin all other days.  -Expected duration of therapy: per hematology pending further workup. Therapy started on  02/24/24. -Patient educated on purpose, proper use and potential adverse effects of warfarin (Coumadin ). -Counseled on dietary and drug interactions of warfarin. -Discussed importance of taking medication around the same time every day. -Advised patient of medications to avoid (NSAIDs, aspirin doses >100 mg daily). -Educated that Tylenol  (acetaminophen ) is the preferred analgesic to lower the risk of bleeding. -Advised patient to alert all providers of anticoagulation therapy prior to starting a new medication or having a procedure. -Emphasized importance of monitoring for signs and symptoms of bleeding (abnormal bruising, prolonged bleeding, nose bleeds, bleeding from gums, discolored urine, black tarry stools). -Educated patient to present to the ED if emergent signs and symptoms of new thrombosis occur. -Counseled patient to wear compression stockings daily, removing at night. Elevate legs to help improve swelling.   Follow up: 1 weeks for next INR check in DVT Clinic.   [name] Hoops, PharmD, [place], CPP Deep Vein Thrombosis Clinic Clinical Pharmacist Practitioner

## 2024-04-20 NOTE — Patient Instructions (Signed)
 Description   Take 1 shot of Lovenox  today and tomorrow. Take 10 mg (2 tablets) of warfarin today and tomorrow, then start taking  5 mg on Sundays and Thursdays and 7.5 mg (1 and a half tablets) all other days.

## 2024-04-21 ENCOUNTER — Telehealth: Payer: Self-pay

## 2024-04-21 ENCOUNTER — Ambulatory Visit (HOSPITAL_COMMUNITY): Admitting: Student-PharmD

## 2024-04-21 ENCOUNTER — Other Ambulatory Visit: Payer: Self-pay | Admitting: Family Medicine

## 2024-04-21 NOTE — Telephone Encounter (Signed)
 Called [facility] to ensure they received our order for CT chest. Pt is currently scheduled for 05/03/24 for CT chest w contrast and CT urogram.

## 2024-04-22 LAB — FACTOR 5 LEIDEN

## 2024-04-22 LAB — PROTHROMBIN GENE MUTATION

## 2024-04-25 LAB — NGS JAK2 E12-15/CALR/MPL

## 2024-04-26 ENCOUNTER — Ambulatory Visit: Attending: Surgery | Admitting: Student-PharmD

## 2024-04-26 DIAGNOSIS — Z7901 Long term (current) use of anticoagulants: Secondary | ICD-10-CM | POA: Diagnosis not present

## 2024-04-26 DIAGNOSIS — I82413 Acute embolism and thrombosis of femoral vein, bilateral: Secondary | ICD-10-CM

## 2024-04-26 LAB — POCT INR: INR: 2 (ref 2.0–3.0)

## 2024-04-26 LAB — HEXAGONAL PHOSPHOLIPID NEUTRALIZATION: Hexagonal Phospholipid Neutral: 3 s

## 2024-04-26 NOTE — Progress Notes (Signed)
 DVT Clinic Note  Name: Amanda Mathews     MRN: [id number]     DOB: Apr 04, 1946     Sex: female  PCP: Rodney Clamp, MD  Today's Visit: Visit Information: Follow Up Visit  Referred to DVT Clinic by: Primary Care - Dr. Daneil Dunker Referred to CPP by: Dr. Vikki Graves Reason for referral:  Chief Complaint  Patient presents with   Warfarin management   HISTORY OF PRESENT ILLNESS: Amanda Mathews is a 78 y.o. female with PMH HTN, HLD, hypothyroidism, osteoporosis, OSA, chronic back pain, trigeminal neuralgia on carbamazepine , who presents after diagnosis of DVT for medication management. Patient was seen by her PCP and reported bilateral lower extremity edema, right worse than left, along with pain for the prior month. Ultrasound showed bilateral acute DVTs and she was referred to DVT Clinic to initiate treatment. No prior personal or family history of VTE. Initially seen in DVT Clinic 02/24/24, initiated anticoagulation with warfarin and Lovenox  bridge due to drug interaction with DOACs and carbamazepine . Dr. Vikki Graves was consulted and CT venogram was ordered. She followed up with Dr. Vikki Graves on 03/17/24 who reviewed her CT scan and he saw no evidence of central DVT or obstructive process. He recommended she see a hematologist. She continues to follow up in DVT Clinic for INR management.  Today, patient arrives in good spirits and is ambulating well with a walker. Continues to have no SOB, CP, palpitations. Denies abnormal bleeding or bruising. Denies missed or extra doses of warfarin. No changes in medications. She established with hematology on 04/16/24 and is awaiting lab results and further CT imaging to determine any possible underlying causes of DVT.   Positive Thrombotic Risk Factors: Obesity, Older Age Bleeding Risk Factors: Age >65 years, Anticoagulant therapy  Negative Thrombotic Risk Factors: Previous VTE, Recent surgery (within 3 months), Recent trauma (within 3 months), Recent admission to hospital  with acute illness (within 3 months), Paralysis, paresis, or recent plaster cast immobilization of lower extremity, Central venous catheterization, Bed rest >72 hours within 3 months, Sedentary journey lasting >8 hours within 4 weeks, Pregnancy, Within 6 weeks postpartum, Recent cesarean section (within 3 months), Estrogen therapy, Testosterone therapy, Erythropoiesis-stimulating agent, Recent COVID diagnosis (within 3 months), Active cancer, Non-malignant, chronic inflammatory condition, Known thrombophilic condition, Smoking  Rx Insurance Coverage: Medicare Rx Affordability: Lovenox  is $10/month. Warfarin is $4/month. Rx Assistance Provided: None needed at this time Preferred Pharmacy: Lovenox  and warfarin filled at [facility] during initial DVT visit. Refills of warfarin sent to [facility] preferred [facility].   Past Medical History:  Diagnosis Date   Cataract    Osteoporosis    Sleep apnea    Thyroid  disease     Past Surgical History:  Procedure Laterality Date   back stimulator      BUNIONECTOMY     CHOLECYSTECTOMY     FEMUR FRACTURE SURGERY     REPLACEMENT TOTAL KNEE BILATERAL Bilateral     Social History   Socioeconomic History   Marital status: Divorced    Spouse name: Not on file   Number of children: Not on file   Years of education: Not on file   Highest education level: Associate degree: occupational, Scientist, product/process development, or vocational program  Occupational History   Occupation: Retired    Occupation: retired    Comment: [employer]  Tobacco Use   Smoking status: Never   Smokeless tobacco: Never  Vaping Use   Vaping status: Never Used  Substance and Sexual Activity  Alcohol use: Never   Drug use: Never   Sexual activity: Not Currently    Birth control/protection: Abstinence  Other Topics Concern   Not on file  Social History Narrative   Right handed   Live alone 3 stairs to door   Caffeine 2 times daily   Social Drivers of Health   Financial Resource Strain: Low  Risk  (03/09/2024)   Overall Financial Resource Strain (CARDIA)    Difficulty of Paying Living Expenses: Not hard at all  Food Insecurity: Low Risk  (03/16/2024)   Received from [facility]   Hunger Vital Sign    Worried About Running Out of Food in the Last Year: Never true    Ran Out of Food in the Last Year: Never true  Transportation Needs: No Transportation Needs (03/16/2024)   Received from [facility]    In the past 12 months, has lack of reliable transportation kept you from medical appointments, meetings, work or from getting things needed for daily living? : No  Physical Activity: Insufficiently Active (03/09/2024)   Exercise Vital Sign    Days of Exercise per Week: 2 days    Minutes of Exercise per Session: 30 min  Stress: No Stress Concern Present (03/09/2024)   [employer] of Occupational Health - Occupational Stress Questionnaire    Feeling of Stress : Not at all  Social Connections: Moderately Integrated (03/09/2024)   Social Connection and Isolation Panel [NHANES]    Frequency of Communication with Friends and Family: More than three times a week    Frequency of Social Gatherings with Friends and Family: More than three times a week    Attends Religious Services: More than 4 times per year    Active Member of Clubs or Organizations: Yes    Attends Banker Meetings: More than 4 times per year    Marital Status: Divorced  Intimate Partner Violence: Not At Risk (11/05/2023)   Humiliation, Afraid, Rape, and Kick questionnaire    Fear of Current or Ex-Partner: No    Emotionally Abused: No    Physically Abused: No    Sexually Abused: No    Family History  Problem Relation Age of Onset   Hypertension Mother    Stroke Mother    Arthritis Sister    COPD Sister    Depression Sister    Diabetes Sister    Alcohol abuse Brother    Hypertension Sister     Allergies as of 04/26/2024 - Review Complete 04/26/2024  Allergen Reaction Noted    Codeine Nausea And Vomiting and Nausea Only 05/11/2018   Hydrocodone  Other (See Comments) 11/05/2023   Hydromorphone Nausea Only and Nausea And Vomiting 05/11/2018   Morphine Nausea Only and Other (See Comments) 05/11/2018   Oxycodone  Other (See Comments) 11/05/2023   Ropinirole  10/06/2020    Current Outpatient Medications on File Prior to Visit  Medication Sig Dispense Refill   acetaminophen  (TYLENOL ) 500 MG tablet Take 500 mg by mouth every 6 (six) hours as needed for mild pain.     ammonium lactate (AMLACTIN) 12 % cream APPLY THIN COAT TO AFFECTED AREAS & RUB IN TWICE A DAY IN THE MORNING & IN THE EVENING     benzonatate  (TESSALON  PERLES) 100 MG capsule Take 1 capsule (100 mg total) by mouth 3 (three) times daily as needed. 30 capsule 1   Calcium Carb-Cholecalciferol (CALCIUM 600-D PO) Take 600 mg by mouth daily.     carbamazepine  (TEGRETOL ) 200 MG  tablet Take 200 mg by mouth 3 (three) times daily.     Cholecalciferol (VITAMIN D3) 125 MCG (5000 UT) CAPS Take 5,000 Units by mouth daily.     denosumab  (PROLIA ) 60 MG/ML SOSY injection Inject 60 mg into the skin every 6 (six) months.     gabapentin (NEURONTIN) 300 MG capsule TAKE 1 CAPSULE BY MOUTH THREE TIMES A DAY (Patient taking differently: Take 300 mg by mouth 2 (two) times daily.) 90 capsule 5   hydrochlorothiazide  (HYDRODIURIL ) 25 MG tablet Take 0.5 tablets (12.5 mg total) by mouth daily.     hydrocortisone 2.5 % cream Apply topically daily as needed.     levothyroxine  (SYNTHROID ) 137 MCG tablet TAKE 1 TABLET BY MOUTH DAILY BEFORE BREAKFAST. 90 tablet 1   methocarbamol  (ROBAXIN ) 750 MG tablet TAKE 1 TABLET BY MOUTH TWICE A DAY AS NEEDED FOR MUSCLE SPASMS 60 tablet 0   nystatin cream (MYCOSTATIN) nystatin 100,000 unit/gram topical cream     simvastatin  (ZOCOR ) 20 MG tablet Take 20 mg by mouth at bedtime.     tamsulosin (FLOMAX) 0.4 MG CAPS capsule Take 1 capsule by mouth daily.     triamcinolone cream (KENALOG) 0.1 % MIX WITH NYSTATIN  AND APPLY A THIN LAYER TO THE AFFECTED AREA TWICE DAILY AS NEEDED     warfarin (COUMADIN ) 5 MG tablet Take 7.5 mg (1 and a half tablets) on Mon, Wed, Fri, and 5 mg (1 tablet) all other days. Or as directed by DVT Clinic. 45 tablet 4   No current facility-administered medications on file prior to visit.   REVIEW OF SYSTEMS:  Review of Systems  Respiratory:  Negative for shortness of breath.   Cardiovascular:  Positive for leg swelling. Negative for chest pain and palpitations.  Musculoskeletal:  Positive for myalgias.  Neurological:  Positive for tingling. Negative for dizziness.   PHYSICAL EXAMINATION:  There were no vitals filed for this visit.  Physical Exam Vitals reviewed.  Cardiovascular:     Rate and Rhythm: Normal rate.  Pulmonary:     Effort: Pulmonary effort is normal.  Musculoskeletal:        General: No tenderness.     Right lower leg: Edema present.     Left lower leg: Edema present.  Skin:    Findings: Erythema present. No bruising.  Psychiatric:        Mood and Affect: Mood normal.        Behavior: Behavior normal.        Thought Content: Thought content normal.   Villalta Score for Post-Thrombotic Syndrome: Pain: Moderate Cramps: Moderate Heaviness: Moderate Paresthesia: Moderate Pruritus: Moderate Pretibial Edema: Moderate Skin Induration: Absent Hyperpigmentation: Absent Redness: Absent Venous Ectasia: Absent Pain on calf compression: Mild Villalta Preliminary Score: 13 Is venous ulcer present?: No If venous ulcer is present and score is <15, then 15 points total are assigned: Absent Villalta Total Score: 13  LABS:  CBC     Component Value Date/Time   WBC 5.4 04/16/2024 1238   RBC 4.41 04/16/2024 1238   HGB 13.9 04/16/2024 1238   HCT 39.8 04/16/2024 1238   PLT 199 04/16/2024 1238   MCV 90.2 04/16/2024 1238   MCH 31.5 04/16/2024 1238   MCHC 34.9 04/16/2024 1238   RDW 12.8 04/16/2024 1238   LYMPHSABS 0.8 04/16/2024 1238   MONOABS 0.5  04/16/2024 1238   EOSABS 0.1 04/16/2024 1238   BASOSABS 0.0 04/16/2024 1238    Hepatic Function      Component Value Date/Time  PROT 6.4 02/24/2024 0906   ALBUMIN 4.1 02/24/2024 0906   AST 15 02/24/2024 0906   ALT 11 02/24/2024 0906   ALKPHOS 115 02/24/2024 0906   BILITOT 0.5 02/24/2024 0906    Renal Function   Lab Results  Component Value Date   CREATININE 0.96 02/24/2024   CREATININE 0.86 03/20/2023   CREATININE 0.98 05/06/2022    CrCl cannot be calculated (Patient's most recent lab result is older than the maximum 21 days allowed.).   [facility] Vascular Lab Studies:  02/24/24 VAS US  LOWER EXTREMITY VENOUS (DVT)  Summary:  RIGHT:  - Findings consistent with acute deep vein thrombosis involving the right  common femoral vein, right femoral vein, right popliteal vein, and TPT.  - All other veins visualized appear fully compressible and demonstrate  appropriate Doppler characteristics.    LEFT:  - Findings consistent with acute deep vein thrombosis involving the left  femoral vein, and left popliteal vein.  - All other veins visualized appear fully compressible and demonstrate  appropriate Doppler characteristics.   Right Technical Findings:  Unable to visualize the posterior tibial and peroneal veins due to body  habitus and increase lower leg swelling.   Left Technical Findings:  Unable to visualize the posterior tibial and peroneal veins due to body  habitus and increase lower leg swelling.   ASSESSMENT: Location of DVT: Right common femoral vein, Left femoral vein, Right femoral vein, Left popliteal vein, Right popliteal vein, Right distal vein  Patient without prior history of VTE diagnosed with bilateral DVTs 02/24/24. Due to involvement of the right common femoral vein and presence of extensive bilateral DVTs, discussed patient with Dr. Vikki Graves during her initial DVT Clinic visit. Patient's extended travel (8 hour in car) on 02/23/24 is unlikely to be the primary provoking  cause, as her RLE edema had been present over one month, and her last travel prior to that was many months ago, but the drive could have contributed to worsening of symptoms. Per patient, LLE edema was at baseline. She had some discomfort in both legs but does not describe them as painful. Dr. Vikki Graves recommended starting anticoagulation and he would arrange for her to follow up with him in the next few weeks for further workup including CT venogram. She followed up with Dr. Vikki Graves on 03/17/24 and there was no central DVT or obstructive process seen on CT. He recommended referral to hematology, and her appointment is scheduling for 04/16/24.   Due to drug interaction with carbamazepine , DOACs are contraindicated and patient requires anticoagulation with warfarin with Lovenox  bridge until INR >2. CBC WNL on 02/24/24. She completed her Lovenox  bridge as of 03/02/24. Carbamazepine  induces warfarin metabolism which decreases warfarin concentrations. As a result, patient will require very close INR monitoring to maintain INR goal 2-3.   INR today has returned to therapeutic range after boosting and increasing her weekly dose for repeat subtherapeutic INR at last visit. Will continue increased dosing regimen and recheck in 2 weeks.   PLAN: -Take 5 mg of warfarin on Thursdays and 7.5 mg (1 and a half tablets) all other days.  -Expected duration of therapy: per hematology pending further workup. Therapy started on 02/24/24. -Patient educated on purpose, proper use and potential adverse effects of warfarin (Coumadin ). -Counseled on dietary and drug interactions of warfarin. -Discussed importance of taking medication around the same time every day. -Advised patient of medications to avoid (NSAIDs, aspirin doses >100 mg daily). -Educated that Tylenol  (acetaminophen ) is the preferred analgesic to lower the  risk of bleeding. -Advised patient to alert all providers of anticoagulation therapy prior to starting a new medication  or having a procedure. -Emphasized importance of monitoring for signs and symptoms of bleeding (abnormal bruising, prolonged bleeding, nose bleeds, bleeding from gums, discolored urine, black tarry stools). -Educated patient to present to the ED if emergent signs and symptoms of new thrombosis occur. -Counseled patient to wear compression stockings daily, removing at night. Elevate legs to help improve swelling.   Follow up: 2 weeks for next INR check in DVT Clinic.   [name] Hoops, PharmD, [place], CPP [facility] Thrombosis Clinic Clinical Pharmacist Practitioner

## 2024-04-26 NOTE — Patient Instructions (Signed)
 Description   Take 5 mg on Thursdays and 7.5 mg (1 and a half tablets) all other days.

## 2024-05-03 DIAGNOSIS — R918 Other nonspecific abnormal finding of lung field: Secondary | ICD-10-CM | POA: Diagnosis not present

## 2024-05-03 DIAGNOSIS — R3129 Other microscopic hematuria: Secondary | ICD-10-CM | POA: Diagnosis not present

## 2024-05-11 ENCOUNTER — Other Ambulatory Visit: Payer: Self-pay | Admitting: Family Medicine

## 2024-05-11 ENCOUNTER — Ambulatory Visit: Attending: Surgery | Admitting: Student-PharmD

## 2024-05-11 ENCOUNTER — Encounter: Payer: Self-pay | Admitting: Student-PharmD

## 2024-05-11 DIAGNOSIS — I82413 Acute embolism and thrombosis of femoral vein, bilateral: Secondary | ICD-10-CM

## 2024-05-11 DIAGNOSIS — Z7901 Long term (current) use of anticoagulants: Secondary | ICD-10-CM | POA: Diagnosis not present

## 2024-05-11 LAB — POCT INR: INR: 1.8 — AB (ref 2.0–3.0)

## 2024-05-11 NOTE — Patient Instructions (Signed)
 Description   Take 10 mg (2 tablets) on Tuesdays and 7.5 mg (1 and a half tablets) all other days.

## 2024-05-11 NOTE — Progress Notes (Signed)
 DVT Clinic Note  Name: Amanda Mathews     MRN: [id number]     DOB: 1946/11/24     Sex: female  PCP: Rodney Clamp, MD  Today's Visit: Visit Information: Follow Up Visit  Referred to DVT Clinic by: Primary Care - Dr. Daneil Dunker Referred to CPP by: Dr. Vikki Graves Reason for referral:  Chief Complaint  Patient presents with   Warfarin management   HISTORY OF PRESENT ILLNESS: Amanda Mathews is a 78 y.o. female with PMH HTN, HLD, hypothyroidism, osteoporosis, OSA, chronic back pain, trigeminal neuralgia on carbamazepine , who presents after diagnosis of DVT for medication management. Patient was seen by her PCP and reported bilateral lower extremity edema, right worse than left, along with pain for the prior month. Ultrasound showed bilateral acute DVTs and she was referred to DVT Clinic to initiate treatment. No prior personal or family history of VTE. Initially seen in DVT Clinic 02/24/24, initiated anticoagulation with warfarin and Lovenox  bridge due to drug interaction with DOACs and carbamazepine . Dr. Vikki Graves was consulted and CT venogram was ordered. She followed up with Dr. Vikki Graves on 03/17/24 who reviewed her CT scan and he saw no evidence of central DVT or obstructive process. He recommended she see a hematologist. She continues to follow up in DVT Clinic for INR management.  Today, patient arrives in good spirits and is ambulating well with a walker. Continues to have no SOB, CP, palpitations. Denies abnormal bleeding or bruising. Denies missed or extra doses of warfarin. No changes in medications. She established with hematology on 04/16/24 and follows up with them again next week to review lab and CT results to determine any possible underlying causes of DVT.   Positive Thrombotic Risk Factors: Obesity, Older Age Bleeding Risk Factors: Age >65 years, Anticoagulant therapy  Negative Thrombotic Risk Factors: Previous VTE, Recent surgery (within 3 months), Recent trauma (within 3 months), Recent  admission to hospital with acute illness (within 3 months), Paralysis, paresis, or recent plaster cast immobilization of lower extremity, Central venous catheterization, Bed rest >72 hours within 3 months, Sedentary journey lasting >8 hours within 4 weeks, Pregnancy, Within 6 weeks postpartum, Recent cesarean section (within 3 months), Estrogen therapy, Testosterone therapy, Erythropoiesis-stimulating agent, Recent COVID diagnosis (within 3 months), Active cancer, Non-malignant, chronic inflammatory condition, Known thrombophilic condition, Smoking  Rx Insurance Coverage: Medicare Rx Affordability: Lovenox  is $10/month. Warfarin is $4/month. Rx Assistance Provided: None needed at this time Preferred Pharmacy: Lovenox  and warfarin filled at [facility] during initial DVT visit. Refills of warfarin sent to [facility] preferred [facility].   Past Medical History:  Diagnosis Date   Cataract    Osteoporosis    Sleep apnea    Thyroid  disease     Past Surgical History:  Procedure Laterality Date   back stimulator      BUNIONECTOMY     CHOLECYSTECTOMY     FEMUR FRACTURE SURGERY     REPLACEMENT TOTAL KNEE BILATERAL Bilateral     Social History   Socioeconomic History   Marital status: Divorced    Spouse name: Not on file   Number of children: Not on file   Years of education: Not on file   Highest education level: Associate degree: occupational, Scientist, product/process development, or vocational program  Occupational History   Occupation: Retired    Occupation: retired    Comment: [employer]  Tobacco Use   Smoking status: Never   Smokeless tobacco: Never  Vaping Use   Vaping status: Never Used  Substance  and Sexual Activity   Alcohol use: Never   Drug use: Never   Sexual activity: Not Currently    Birth control/protection: Abstinence  Other Topics Concern   Not on file  Social History Narrative   Right handed   Live alone 3 stairs to door   Caffeine 2 times daily   Social Drivers of Health   Financial  Resource Strain: Low Risk  (03/09/2024)   Overall Financial Resource Strain (CARDIA)    Difficulty of Paying Living Expenses: Not hard at all  Food Insecurity: Low Risk  (03/16/2024)   Received from [facility]   Hunger Vital Sign    Worried About Running Out of Food in the Last Year: Never true    Ran Out of Food in the Last Year: Never true  Transportation Needs: No Transportation Needs (03/16/2024)   Received from [facility]    In the past 12 months, has lack of reliable transportation kept you from medical appointments, meetings, work or from getting things needed for daily living? : No  Physical Activity: Insufficiently Active (03/09/2024)   Exercise Vital Sign    Days of Exercise per Week: 2 days    Minutes of Exercise per Session: 30 min  Stress: No Stress Concern Present (03/09/2024)   [employer] of Occupational Health - Occupational Stress Questionnaire    Feeling of Stress : Not at all  Social Connections: Moderately Integrated (03/09/2024)   Social Connection and Isolation Panel [NHANES]    Frequency of Communication with Friends and Family: More than three times a week    Frequency of Social Gatherings with Friends and Family: More than three times a week    Attends Religious Services: More than 4 times per year    Active Member of Clubs or Organizations: Yes    Attends Banker Meetings: More than 4 times per year    Marital Status: Divorced  Intimate Partner Violence: Not At Risk (11/05/2023)   Humiliation, Afraid, Rape, and Kick questionnaire    Fear of Current or Ex-Partner: No    Emotionally Abused: No    Physically Abused: No    Sexually Abused: No    Family History  Problem Relation Age of Onset   Hypertension Mother    Stroke Mother    Arthritis Sister    COPD Sister    Depression Sister    Diabetes Sister    Alcohol abuse Brother    Hypertension Sister     Allergies as of 05/11/2024 - Review Complete 05/11/2024   Allergen Reaction Noted   Codeine Nausea And Vomiting and Nausea Only 05/11/2018   Hydrocodone  Other (See Comments) 11/05/2023   Hydromorphone Nausea Only and Nausea And Vomiting 05/11/2018   Morphine Nausea Only and Other (See Comments) 05/11/2018   Oxycodone  Other (See Comments) 11/05/2023   Ropinirole  10/06/2020    Current Outpatient Medications on File Prior to Visit  Medication Sig Dispense Refill   warfarin (COUMADIN ) 5 MG tablet Take 7.5 mg (1 and a half tablets) on Mon, Wed, Fri, and 5 mg (1 tablet) all other days. Or as directed by DVT Clinic. 45 tablet 4   acetaminophen  (TYLENOL ) 500 MG tablet Take 500 mg by mouth every 6 (six) hours as needed for mild pain.     ammonium lactate (AMLACTIN) 12 % cream APPLY THIN COAT TO AFFECTED AREAS & RUB IN TWICE A DAY IN THE MORNING & IN THE EVENING     benzonatate  (TESSALON  PERLES)  100 MG capsule Take 1 capsule (100 mg total) by mouth 3 (three) times daily as needed. 30 capsule 1   Calcium Carb-Cholecalciferol (CALCIUM 600-D PO) Take 600 mg by mouth daily.     carbamazepine  (TEGRETOL ) 200 MG tablet Take 200 mg by mouth 3 (three) times daily.     Cholecalciferol (VITAMIN D3) 125 MCG (5000 UT) CAPS Take 5,000 Units by mouth daily.     denosumab  (PROLIA ) 60 MG/ML SOSY injection Inject 60 mg into the skin every 6 (six) months.     gabapentin (NEURONTIN) 300 MG capsule TAKE 1 CAPSULE BY MOUTH THREE TIMES A DAY (Patient taking differently: Take 300 mg by mouth 2 (two) times daily.) 90 capsule 5   hydrochlorothiazide  (HYDRODIURIL ) 25 MG tablet Take 0.5 tablets (12.5 mg total) by mouth daily.     hydrocortisone 2.5 % cream Apply topically daily as needed.     levothyroxine  (SYNTHROID ) 137 MCG tablet TAKE 1 TABLET BY MOUTH DAILY BEFORE BREAKFAST. 90 tablet 1   methocarbamol  (ROBAXIN ) 750 MG tablet TAKE 1 TABLET BY MOUTH TWICE A DAY AS NEEDED FOR MUSCLE SPASMS 60 tablet 0   nystatin cream (MYCOSTATIN) nystatin 100,000 unit/gram topical cream      simvastatin  (ZOCOR ) 20 MG tablet Take 20 mg by mouth at bedtime.     tamsulosin (FLOMAX) 0.4 MG CAPS capsule Take 1 capsule by mouth daily.     triamcinolone cream (KENALOG) 0.1 % MIX WITH NYSTATIN AND APPLY A THIN LAYER TO THE AFFECTED AREA TWICE DAILY AS NEEDED     No current facility-administered medications on file prior to visit.   REVIEW OF SYSTEMS:  Review of Systems  Respiratory:  Negative for shortness of breath.   Cardiovascular:  Positive for leg swelling. Negative for chest pain and palpitations.  Musculoskeletal:  Positive for myalgias.  Neurological:  Positive for tingling. Negative for dizziness.   PHYSICAL EXAMINATION:  There were no vitals filed for this visit.  Physical Exam Vitals reviewed.  Cardiovascular:     Rate and Rhythm: Normal rate.  Pulmonary:     Effort: Pulmonary effort is normal.  Musculoskeletal:        General: No tenderness.     Right lower leg: Edema present.     Left lower leg: Edema present.  Skin:    Findings: Erythema present. No bruising.  Psychiatric:        Mood and Affect: Mood normal.        Behavior: Behavior normal.        Thought Content: Thought content normal.   Villalta Score for Post-Thrombotic Syndrome: Pain: Moderate Cramps: Moderate Heaviness: Moderate Paresthesia: Moderate Pruritus: Moderate Pretibial Edema: Moderate Skin Induration: Absent Hyperpigmentation: Absent Redness: Absent Venous Ectasia: Absent Pain on calf compression: Mild Villalta Preliminary Score: 13 Is venous ulcer present?: No If venous ulcer is present and score is <15, then 15 points total are assigned: Absent Villalta Total Score: 13  LABS:  CBC     Component Value Date/Time   WBC 5.4 04/16/2024 1238   RBC 4.41 04/16/2024 1238   HGB 13.9 04/16/2024 1238   HCT 39.8 04/16/2024 1238   PLT 199 04/16/2024 1238   MCV 90.2 04/16/2024 1238   MCH 31.5 04/16/2024 1238   MCHC 34.9 04/16/2024 1238   RDW 12.8 04/16/2024 1238   LYMPHSABS 0.8  04/16/2024 1238   MONOABS 0.5 04/16/2024 1238   EOSABS 0.1 04/16/2024 1238   BASOSABS 0.0 04/16/2024 1238    Hepatic Function  Component Value Date/Time   PROT 6.4 02/24/2024 0906   ALBUMIN 4.1 02/24/2024 0906   AST 15 02/24/2024 0906   ALT 11 02/24/2024 0906   ALKPHOS 115 02/24/2024 0906   BILITOT 0.5 02/24/2024 0906    Renal Function   Lab Results  Component Value Date   CREATININE 0.96 02/24/2024   CREATININE 0.86 03/20/2023   CREATININE 0.98 05/06/2022    CrCl cannot be calculated (Patient's most recent lab result is older than the maximum 21 days allowed.).   [facility] Vascular Lab Studies:  02/24/24 VAS US  LOWER EXTREMITY VENOUS (DVT)  Summary:  RIGHT:  - Findings consistent with acute deep vein thrombosis involving the right  common femoral vein, right femoral vein, right popliteal vein, and TPT.  - All other veins visualized appear fully compressible and demonstrate  appropriate Doppler characteristics.    LEFT:  - Findings consistent with acute deep vein thrombosis involving the left  femoral vein, and left popliteal vein.  - All other veins visualized appear fully compressible and demonstrate  appropriate Doppler characteristics.   Right Technical Findings:  Unable to visualize the posterior tibial and peroneal veins due to body  habitus and increase lower leg swelling.   Left Technical Findings:  Unable to visualize the posterior tibial and peroneal veins due to body  habitus and increase lower leg swelling.   ASSESSMENT: Location of DVT: Right common femoral vein, Left femoral vein, Right femoral vein, Left popliteal vein, Right popliteal vein, Right distal vein  Patient without prior history of VTE diagnosed with bilateral DVTs 02/24/24. Due to involvement of the right common femoral vein and presence of extensive bilateral DVTs, discussed patient with Dr. Vikki Graves during her initial DVT Clinic visit. Patient's extended travel (8 hour in car) on 02/23/24 is  unlikely to be the primary provoking cause, as her RLE edema had been present over one month, and her last travel prior to that was many months ago, but the drive could have contributed to worsening of symptoms. Per patient, LLE edema was at baseline. She had some discomfort in both legs but does not describe them as painful. Dr. Vikki Graves recommended starting anticoagulation and he would arrange for her to follow up with him in the next few weeks for further workup including CT venogram. She followed up with Dr. Vikki Graves on 03/17/24 and there was no central DVT or obstructive process seen on CT. He recommended referral to hematology, and her appointment is scheduling for 04/16/24.   Due to drug interaction with carbamazepine , DOACs are contraindicated and patient requires anticoagulation with warfarin with Lovenox  bridge until INR >2. CBC WNL on 02/24/24. She completed her Lovenox  bridge as of 03/02/24. Carbamazepine  induces warfarin metabolism which decreases warfarin concentrations. As a result, patient will require very close INR monitoring to maintain INR goal 2-3.   INR today has dipped back down to 1.8 after increasing her weekly regimen for multiple recent subtherapeutic levels which had initially brought her into range. Will increase weekly dose again and recheck in 2 weeks.   PLAN: -Take 10 mg (2 tablets) on Tuesdays and 7.5 mg (1 and a half tablets) all other days.  -Expected duration of therapy: per hematology pending further workup. Therapy started on 02/24/24. -Patient educated on purpose, proper use and potential adverse effects of warfarin (Coumadin ). -Counseled on dietary and drug interactions of warfarin. -Discussed importance of taking medication around the same time every day. -Advised patient of medications to avoid (NSAIDs, aspirin doses >100 mg daily). -Educated that  Tylenol  (acetaminophen ) is the preferred analgesic to lower the risk of bleeding. -Advised patient to alert all providers of  anticoagulation therapy prior to starting a new medication or having a procedure. -Emphasized importance of monitoring for signs and symptoms of bleeding (abnormal bruising, prolonged bleeding, nose bleeds, bleeding from gums, discolored urine, black tarry stools). -Educated patient to present to the ED if emergent signs and symptoms of new thrombosis occur. -Counseled patient to wear compression stockings daily, removing at night. Elevate legs to help improve swelling.   Follow up: 2 weeks for next INR check in DVT Clinic.   [name] Hoops, PharmD, [place], CPP [facility] Clinical Pharmacist Practitioner

## 2024-05-14 ENCOUNTER — Other Ambulatory Visit: Payer: Self-pay | Admitting: Internal Medicine

## 2024-05-19 ENCOUNTER — Telehealth: Payer: Self-pay

## 2024-05-19 NOTE — Telephone Encounter (Signed)
 Verbally confirmed appt for 6/5

## 2024-05-20 ENCOUNTER — Inpatient Hospital Stay: Attending: Hematology and Oncology | Admitting: Hematology and Oncology

## 2024-05-20 VITALS — BP 115/72 | HR 74 | Temp 98.3°F | Resp 19 | Ht 60.0 in | Wt 193.7 lb

## 2024-05-20 DIAGNOSIS — M810 Age-related osteoporosis without current pathological fracture: Secondary | ICD-10-CM | POA: Insufficient documentation

## 2024-05-20 DIAGNOSIS — J9811 Atelectasis: Secondary | ICD-10-CM | POA: Insufficient documentation

## 2024-05-20 DIAGNOSIS — I82403 Acute embolism and thrombosis of unspecified deep veins of lower extremity, bilateral: Secondary | ICD-10-CM | POA: Diagnosis not present

## 2024-05-20 DIAGNOSIS — I82413 Acute embolism and thrombosis of femoral vein, bilateral: Secondary | ICD-10-CM

## 2024-05-20 DIAGNOSIS — Z7901 Long term (current) use of anticoagulants: Secondary | ICD-10-CM | POA: Insufficient documentation

## 2024-05-20 DIAGNOSIS — G500 Trigeminal neuralgia: Secondary | ICD-10-CM | POA: Insufficient documentation

## 2024-05-20 NOTE — Progress Notes (Signed)
 [facility] CONSULT NOTE  Patient Care Team: Rodney Clamp, MD as PCP - General (Family Medicine)  CHIEF COMPLAINTS/PURPOSE OF CONSULTATION:  Bilateral LE DVT  ASSESSMENT & PLAN:  No problem-specific Assessment & Plan notes found for this encounter.  No orders of the defined types were placed in this encounter.  Assessment and Plan Assessment & Plan Deep vein thrombosis of both legs First DVT episode in March 2025, likely triggered by long-distance travel. No malignancy on CT abdomen/pelvis.  Evaluating for clotting disorders and other causes.  - She is on warfarin for anticoagulation. - Order CT chest to evaluate for underlying causes of DVT. - Perform blood work to assess for clotting disorders. - Continue warfarin therapy as managed by Dr. Alyse July. - All the work up so far with no concerns for hypercoag disorder - CT chest/abd/pelvis with no concerns for malignancy. - Plan to continue warfarin for six months, reassess in September. Discussed lifestyle changes and potential lifelong anticoagulation if recurrence. - Continue warfarin until September. - Consider 81 mg aspirin daily post-warfarin if no further clots. - Advise on lifestyle modifications: hydration, regular movement during travel, avoid hormone therapy.  Trigeminal neuralgia Managed with carbamazepine , necessitating warfarin due to interactions with other anticoagulants.  Atelectasis CT shows partial lung collapse, possible motion artifact. No concerning findings. - Encourage breathing exercises, e.g., blowing bubbles.  Aspiration into lungs Suspected due to esophageal retention. Recommended lifestyle changes to prevent aspiration. - Advise eating 2-3 hours before bedtime. - Encourage walking post-meals.  Osteoporosis on Prolia  Managed with biannual Prolia  injections. - Continue Prolia  injections as prescribed.   HISTORY OF PRESENTING ILLNESS:  Amanda Mathews 78 y.o. female is here  because of bilateral LE DVT  Discussed the use of AI scribe software for clinical note transcription with the patient, who gave verbal consent to proceed.  History of Present Illness Asusena Sigley Trinkle "Amanda Mathews" is a 78 year old female with a history of blood clots who presents with bilateral leg swelling and pain.  In March 2025, she experienced her first episode of blood clots affecting both legs from the thigh to the leg. The swelling in her legs has been persistent, with the left leg showing improvement since starting warfarin, while the right leg is about three-fourths better. She has been wearing compression stockings and elevating her legs to manage the swelling. She recalls traveling approximately 400 miles in one day prior to the onset of the blood clots. No other provoking factors.  Venida Gills Azizi "Amanda Mathews" is a 78 year old female who presents for follow-up on her anticoagulation therapy.  She has been on warfarin since March 2025 due to her first blood clot. Her INR target is between 2 and 3, but there have been fluctuations in her INR levels. She is taking warfarin instead of other anticoagulants due to an interaction with carbamazepine , which she takes for trigeminal neuralgia.  Her CT urogram and CT chest showed some atelectasis and possible aspiration. No overtly concerning findings or cancer were detected.  She is managing osteoporosis with Prolia  injections twice a year and has not reported any new symptoms related to osteoporosis.  She mentions receiving nerve block injections for back pain management.  All other systems were reviewed with the patient and are negative.  MEDICAL HISTORY:  Past Medical History:  Diagnosis Date   Cataract    Osteoporosis    Sleep apnea    Thyroid  disease     SURGICAL HISTORY: Past Surgical History:  Procedure Laterality Date   back stimulator      BUNIONECTOMY     CHOLECYSTECTOMY     FEMUR FRACTURE SURGERY     REPLACEMENT TOTAL KNEE  BILATERAL Bilateral     SOCIAL HISTORY: Social History   Socioeconomic History   Marital status: Divorced    Spouse name: Not on file   Number of children: Not on file   Years of education: Not on file   Highest education level: Associate degree: occupational, Scientist, product/process development, or vocational program  Occupational History   Occupation: Retired    Occupation: retired    Comment: [employer]  Tobacco Use   Smoking status: Never   Smokeless tobacco: Never  Vaping Use   Vaping status: Never Used  Substance and Sexual Activity   Alcohol use: Never   Drug use: Never   Sexual activity: Not Currently    Birth control/protection: Abstinence  Other Topics Concern   Not on file  Social History Narrative   Right handed   Live alone 3 stairs to door   Caffeine 2 times daily   Social Drivers of Health   Financial Resource Strain: Low Risk  (03/09/2024)   Overall Financial Resource Strain (CARDIA)    Difficulty of Paying Living Expenses: Not hard at all  Food Insecurity: Low Risk  (03/16/2024)   Received from [facility]   Hunger Vital Sign    Worried About Running Out of Food in the Last Year: Never true    Ran Out of Food in the Last Year: Never true  Transportation Needs: No Transportation Needs (03/16/2024)   Received from [facility]    In the past 12 months, has lack of reliable transportation kept you from medical appointments, meetings, work or from getting things needed for daily living? : No  Physical Activity: Insufficiently Active (03/09/2024)   Exercise Vital Sign    Days of Exercise per Week: 2 days    Minutes of Exercise per Session: 30 min  Stress: No Stress Concern Present (03/09/2024)   [employer] of Occupational Health - Occupational Stress Questionnaire    Feeling of Stress : Not at all  Social Connections: Moderately Integrated (03/09/2024)   Social Connection and Isolation Panel [NHANES]    Frequency of Communication with Friends and Family: More  than three times a week    Frequency of Social Gatherings with Friends and Family: More than three times a week    Attends Religious Services: More than 4 times per year    Active Member of [employer] or Organizations: Yes    Attends Engineer, structural: More than 4 times per year    Marital Status: Divorced  Intimate Partner Violence: Not At Risk (11/05/2023)   Humiliation, Afraid, Rape, and Kick questionnaire    Fear of Current or Ex-Partner: No    Emotionally Abused: No    Physically Abused: No    Sexually Abused: No    FAMILY HISTORY: Family History  Problem Relation Age of Onset   Hypertension Mother    Stroke Mother    Arthritis Sister    COPD Sister    Depression Sister    Diabetes Sister    Alcohol abuse Brother    Hypertension Sister     ALLERGIES:  is allergic to codeine, hydrocodone , hydromorphone, morphine, oxycodone , and ropinirole.  MEDICATIONS:  Current Outpatient Medications  Medication Sig Dispense Refill   acetaminophen  (TYLENOL ) 500 MG tablet Take 500 mg by mouth every 6 (six) hours  as needed for mild pain.     ammonium lactate (AMLACTIN) 12 % cream APPLY THIN COAT TO AFFECTED AREAS & RUB IN TWICE A DAY IN THE MORNING & IN THE EVENING     benzonatate  (TESSALON  PERLES) 100 MG capsule Take 1 capsule (100 mg total) by mouth 3 (three) times daily as needed. 30 capsule 1   Calcium Carb-Cholecalciferol (CALCIUM 600-D PO) Take 600 mg by mouth daily.     carbamazepine  (TEGRETOL ) 200 MG tablet Take 200 mg by mouth 3 (three) times daily.     Cholecalciferol (VITAMIN D3) 125 MCG (5000 UT) CAPS Take 5,000 Units by mouth daily.     denosumab  (PROLIA ) 60 MG/ML SOSY injection Inject 60 mg into the skin every 6 (six) months.     gabapentin (NEURONTIN) 300 MG capsule TAKE 1 CAPSULE BY MOUTH THREE TIMES A DAY (Patient taking differently: Take 300 mg by mouth 2 (two) times daily.) 90 capsule 5   hydrochlorothiazide  (HYDRODIURIL ) 25 MG tablet Take 0.5 tablets (12.5 mg  total) by mouth daily.     hydrocortisone 2.5 % cream Apply topically daily as needed.     levothyroxine  (SYNTHROID ) 137 MCG tablet TAKE 1 TABLET BY MOUTH DAILY BEFORE BREAKFAST. 90 tablet 1   methocarbamol  (ROBAXIN ) 750 MG tablet TAKE 1 TABLET BY MOUTH TWICE A DAY AS NEEDED FOR MUSCLE SPASM 60 tablet 0   nystatin cream (MYCOSTATIN) nystatin 100,000 unit/gram topical cream     simvastatin  (ZOCOR ) 20 MG tablet TAKE 1 TABLET BY MOUTH EVERYDAY AT BEDTIME 90 tablet 3   tamsulosin (FLOMAX) 0.4 MG CAPS capsule Take 1 capsule by mouth daily.     triamcinolone cream (KENALOG) 0.1 % MIX WITH NYSTATIN AND APPLY A THIN LAYER TO THE AFFECTED AREA TWICE DAILY AS NEEDED     warfarin (COUMADIN ) 5 MG tablet Take 7.5 mg (1 and a half tablets) on Mon, Wed, Fri, and 5 mg (1 tablet) all other days. Or as directed by DVT Clinic. 45 tablet 4   No current facility-administered medications for this visit.     PHYSICAL EXAMINATION: ECOG PERFORMANCE STATUS: 0 - Asymptomatic  There were no vitals filed for this visit. There were no vitals filed for this visit.  GENERAL:alert, no distress and comfortable   LABORATORY DATA:  I have reviewed the data as listed Lab Results  Component Value Date   WBC 5.4 04/16/2024   HGB 13.9 04/16/2024   HCT 39.8 04/16/2024   MCV 90.2 04/16/2024   PLT 199 04/16/2024     Chemistry      Component Value Date/Time   NA 141 02/24/2024 0906   NA 147 06/08/2020 0000   K 3.6 02/24/2024 0906   CL 103 02/24/2024 0906   CO2 30 02/24/2024 0906   BUN 16 02/24/2024 0906   BUN 20 06/08/2020 0000   CREATININE 0.96 02/24/2024 0906   CREATININE 0.98 05/06/2022 1050   GLU 102 06/08/2020 0000      Component Value Date/Time   CALCIUM 9.2 02/24/2024 0906   ALKPHOS 115 02/24/2024 0906   AST 15 02/24/2024 0906   ALT 11 02/24/2024 0906   BILITOT 0.5 02/24/2024 0906       RADIOGRAPHIC STUDIES: I have personally reviewed the radiological images as listed and agreed with the  findings in the report. No results found.  All questions were answered. The patient knows to call the clinic with any problems, questions or concerns. I spent 30 minutes in the care of this patient including H  and P, review of records, counseling and coordination of care.     Murleen Arms, MD 05/20/2024 10:18 AM

## 2024-05-24 ENCOUNTER — Ambulatory Visit: Attending: Surgery | Admitting: Student-PharmD

## 2024-05-24 ENCOUNTER — Encounter: Payer: Self-pay | Admitting: Student-PharmD

## 2024-05-24 VITALS — BP 126/74 | HR 64

## 2024-05-24 DIAGNOSIS — I82413 Acute embolism and thrombosis of femoral vein, bilateral: Secondary | ICD-10-CM

## 2024-05-24 DIAGNOSIS — M7989 Other specified soft tissue disorders: Secondary | ICD-10-CM

## 2024-05-24 DIAGNOSIS — Z7901 Long term (current) use of anticoagulants: Secondary | ICD-10-CM

## 2024-05-24 LAB — POCT INR: INR: 2.4 (ref 2.0–3.0)

## 2024-05-24 MED ORDER — WARFARIN SODIUM 5 MG PO TABS: ORAL_TABLET | ORAL | 2 refills | Status: DC

## 2024-05-24 NOTE — Progress Notes (Cosign Needed Addendum)
 DVT Clinic Note  Name: Amanda Mathews     MRN: [id number]     DOB: 1946-11-19     Sex: female  PCP: Rodney Clamp, MD  Today's Visit: Visit Information: Follow Up Visit  Referred to DVT Clinic by: Primary Care - Dr. Daneil Dunker Referred to CPP by: Dr. Vikki Graves Reason for referral:  Chief Complaint  Patient presents with   Warfarin management   HISTORY OF PRESENT ILLNESS: Amanda Mathews is a 78 y.o. female with PMH HTN, HLD, hypothyroidism, osteoporosis, OSA, chronic back pain, trigeminal neuralgia on carbamazepine , who presents after diagnosis of DVT for medication management. Patient was seen by her PCP and reported bilateral lower extremity edema, right worse than left, along with pain for the prior month. Ultrasound showed bilateral acute DVTs and she was referred to DVT Clinic to initiate treatment. No prior personal or family history of VTE. Initially seen in DVT Clinic 02/24/24, initiated anticoagulation with warfarin and Lovenox  bridge due to drug interaction with DOACs and carbamazepine . Dr. Vikki Graves was consulted and CT venogram was ordered. She followed up with Dr. Vikki Graves on 03/17/24 who reviewed her CT scan and he saw no evidence of central DVT or obstructive process. He recommended she see a hematologist. She continues to follow up in DVT Clinic for INR management.  Today, patient arrives in good spirits and is ambulating well with a walker. Continues to have no SOB, CP, palpitations. She has been adherent with compression and elevation. Denies abnormal bleeding or bruising. Denies missed or extra doses of warfarin. No changes in medications. She has established with hematology who worked up possible underlying causes of DVT and found none. They recommend 6 months of anticoagulation, and per patient she does not need to see them again.   Positive Thrombotic Risk Factors: Obesity, Older Age Bleeding Risk Factors: Age >65 years, Anticoagulant therapy  Negative Thrombotic Risk Factors:  Previous VTE, Recent surgery (within 3 months), Recent trauma (within 3 months), Recent admission to hospital with acute illness (within 3 months), Paralysis, paresis, or recent plaster cast immobilization of lower extremity, Central venous catheterization, Bed rest >72 hours within 3 months, Sedentary journey lasting >8 hours within 4 weeks, Pregnancy, Within 6 weeks postpartum, Recent cesarean section (within 3 months), Estrogen therapy, Testosterone therapy, Erythropoiesis-stimulating agent, Recent COVID diagnosis (within 3 months), Active cancer, Non-malignant, chronic inflammatory condition, Known thrombophilic condition, Smoking  Rx Insurance Coverage: Medicare Rx Affordability: Lovenox  is $10/month. Warfarin is $4/month. Rx Assistance Provided: None needed at this time Preferred Pharmacy: Lovenox  and warfarin filled at [facility] during initial DVT visit. Refills of warfarin sent to [facility] preferred [facility].   Past Medical History:  Diagnosis Date   Cataract    Osteoporosis    Sleep apnea    Thyroid  disease     Past Surgical History:  Procedure Laterality Date   back stimulator      BUNIONECTOMY     CHOLECYSTECTOMY     FEMUR FRACTURE SURGERY     REPLACEMENT TOTAL KNEE BILATERAL Bilateral     Social History   Socioeconomic History   Marital status: Divorced    Spouse name: Not on file   Number of children: Not on file   Years of education: Not on file   Highest education level: Associate degree: occupational, Scientist, product/process development, or vocational program  Occupational History   Occupation: Retired    Occupation: retired    Comment: [employer]  Tobacco Use   Smoking status: Never   Smokeless  tobacco: Never  Vaping Use   Vaping status: Never Used  Substance and Sexual Activity   Alcohol use: Never   Drug use: Never   Sexual activity: Not Currently    Birth control/protection: Abstinence  Other Topics Concern   Not on file  Social History Narrative   Right handed   Live alone  3 stairs to door   Caffeine 2 times daily   Social Drivers of Health   Financial Resource Strain: Low Risk  (03/09/2024)   Overall Financial Resource Strain (CARDIA)    Difficulty of Paying Living Expenses: Not hard at all  Food Insecurity: Low Risk  (03/16/2024)   Received from [facility]   Hunger Vital Sign    Worried About Running Out of Food in the Last Year: Never true    Ran Out of Food in the Last Year: Never true  Transportation Needs: No Transportation Needs (03/16/2024)   Received from [facility]    In the past 12 months, has lack of reliable transportation kept you from medical appointments, meetings, work or from getting things needed for daily living? : No  Physical Activity: Insufficiently Active (03/09/2024)   Exercise Vital Sign    Days of Exercise per Week: 2 days    Minutes of Exercise per Session: 30 min  Stress: No Stress Concern Present (03/09/2024)   [employer] of Occupational Health - Occupational Stress Questionnaire    Feeling of Stress : Not at all  Social Connections: Moderately Integrated (03/09/2024)   Social Connection and Isolation Panel [NHANES]    Frequency of Communication with Friends and Family: More than three times a week    Frequency of Social Gatherings with Friends and Family: More than three times a week    Attends Religious Services: More than 4 times per year    Active Member of Clubs or Organizations: Yes    Attends Banker Meetings: More than 4 times per year    Marital Status: Divorced  Intimate Partner Violence: Not At Risk (11/05/2023)   Humiliation, Afraid, Rape, and Kick questionnaire    Fear of Current or Ex-Partner: No    Emotionally Abused: No    Physically Abused: No    Sexually Abused: No    Family History  Problem Relation Age of Onset   Hypertension Mother    Stroke Mother    Arthritis Sister    COPD Sister    Depression Sister    Diabetes Sister    Alcohol abuse Brother     Hypertension Sister     Allergies as of 05/24/2024 - Review Complete 05/24/2024  Allergen Reaction Noted   Codeine Nausea And Vomiting and Nausea Only 05/11/2018   Hydrocodone  Other (See Comments) 11/05/2023   Hydromorphone Nausea Only and Nausea And Vomiting 05/11/2018   Morphine Nausea Only and Other (See Comments) 05/11/2018   Oxycodone  Other (See Comments) 11/05/2023   Ropinirole  10/06/2020    Current Outpatient Medications on File Prior to Visit  Medication Sig Dispense Refill   warfarin (COUMADIN ) 5 MG tablet Take 7.5 mg (1 and a half tablets) on Mon, Wed, Fri, and 5 mg (1 tablet) all other days. Or as directed by DVT Clinic. 45 tablet 4   acetaminophen  (TYLENOL ) 500 MG tablet Take 500 mg by mouth every 6 (six) hours as needed for mild pain.     ammonium lactate (AMLACTIN) 12 % cream APPLY THIN COAT TO AFFECTED AREAS & RUB IN TWICE A DAY IN  THE MORNING & IN THE EVENING     benzonatate  (TESSALON  PERLES) 100 MG capsule Take 1 capsule (100 mg total) by mouth 3 (three) times daily as needed. 30 capsule 1   Calcium Carb-Cholecalciferol (CALCIUM 600-D PO) Take 600 mg by mouth daily.     carbamazepine  (TEGRETOL ) 200 MG tablet Take 200 mg by mouth 3 (three) times daily.     Cholecalciferol (VITAMIN D3) 125 MCG (5000 UT) CAPS Take 5,000 Units by mouth daily.     denosumab  (PROLIA ) 60 MG/ML SOSY injection Inject 60 mg into the skin every 6 (six) months.     gabapentin (NEURONTIN) 300 MG capsule TAKE 1 CAPSULE BY MOUTH THREE TIMES A DAY (Patient taking differently: Take 300 mg by mouth 2 (two) times daily.) 90 capsule 5   hydrochlorothiazide  (HYDRODIURIL ) 25 MG tablet Take 0.5 tablets (12.5 mg total) by mouth daily.     hydrocortisone 2.5 % cream Apply topically daily as needed.     levothyroxine  (SYNTHROID ) 137 MCG tablet TAKE 1 TABLET BY MOUTH DAILY BEFORE BREAKFAST. 90 tablet 1   methocarbamol  (ROBAXIN ) 750 MG tablet TAKE 1 TABLET BY MOUTH TWICE A DAY AS NEEDED FOR MUSCLE SPASM 60 tablet 0    nystatin cream (MYCOSTATIN) nystatin 100,000 unit/gram topical cream     simvastatin  (ZOCOR ) 20 MG tablet TAKE 1 TABLET BY MOUTH EVERYDAY AT BEDTIME 90 tablet 3   triamcinolone cream (KENALOG) 0.1 % MIX WITH NYSTATIN AND APPLY A THIN LAYER TO THE AFFECTED AREA TWICE DAILY AS NEEDED     No current facility-administered medications on file prior to visit.   REVIEW OF SYSTEMS:  Review of Systems  Respiratory:  Negative for shortness of breath.   Cardiovascular:  Positive for leg swelling. Negative for chest pain and palpitations.  Musculoskeletal:  Positive for myalgias.  Neurological:  Positive for tingling. Negative for dizziness.   PHYSICAL EXAMINATION:  Vitals:   05/24/24 1106  BP: 126/74  Pulse: 64  SpO2: 95%   Physical Exam Vitals reviewed.  Cardiovascular:     Rate and Rhythm: Normal rate.  Pulmonary:     Effort: Pulmonary effort is normal.  Musculoskeletal:        General: No tenderness.     Right lower leg: Edema (right>left) present.     Left lower leg: Edema present.  Skin:    Findings: Erythema present. No bruising.  Psychiatric:        Mood and Affect: Mood normal.        Behavior: Behavior normal.        Thought Content: Thought content normal.   Villalta Score for Post-Thrombotic Syndrome: Pain: Absent Cramps: Absent Heaviness: Absent Paresthesia: Mild Pruritus: Absent Pretibial Edema: Mild Skin Induration: Absent Hyperpigmentation: Absent Redness: Absent Venous Ectasia: Absent Pain on calf compression: Absent Villalta Preliminary Score: 2 Is venous ulcer present?: No If venous ulcer is present and score is <15, then 15 points total are assigned: Absent Villalta Total Score: 2  LABS:  CBC     Component Value Date/Time   WBC 5.4 04/16/2024 1238   RBC 4.41 04/16/2024 1238   HGB 13.9 04/16/2024 1238   HCT 39.8 04/16/2024 1238   PLT 199 04/16/2024 1238   MCV 90.2 04/16/2024 1238   MCH 31.5 04/16/2024 1238   MCHC 34.9 04/16/2024 1238   RDW 12.8  04/16/2024 1238   LYMPHSABS 0.8 04/16/2024 1238   MONOABS 0.5 04/16/2024 1238   EOSABS 0.1 04/16/2024 1238   BASOSABS 0.0 04/16/2024 1238  Hepatic Function      Component Value Date/Time   PROT 6.4 02/24/2024 0906   ALBUMIN 4.1 02/24/2024 0906   AST 15 02/24/2024 0906   ALT 11 02/24/2024 0906   ALKPHOS 115 02/24/2024 0906   BILITOT 0.5 02/24/2024 0906    Renal Function   Lab Results  Component Value Date   CREATININE 0.96 02/24/2024   CREATININE 0.86 03/20/2023   CREATININE 0.98 05/06/2022    CrCl cannot be calculated (Patient's most recent lab result is older than the maximum 21 days allowed.).   [facility] Vascular Lab Studies:  02/24/24 VAS US  LOWER EXTREMITY VENOUS (DVT)  Summary:  RIGHT:  - Findings consistent with acute deep vein thrombosis involving the right  common femoral vein, right femoral vein, right popliteal vein, and TPT.  - All other veins visualized appear fully compressible and demonstrate  appropriate Doppler characteristics.    LEFT:  - Findings consistent with acute deep vein thrombosis involving the left  femoral vein, and left popliteal vein.  - All other veins visualized appear fully compressible and demonstrate  appropriate Doppler characteristics.   Right Technical Findings:  Unable to visualize the posterior tibial and peroneal veins due to body  habitus and increase lower leg swelling.   Left Technical Findings:  Unable to visualize the posterior tibial and peroneal veins due to body  habitus and increase lower leg swelling.   ASSESSMENT: Location of DVT: Right common femoral vein, Left femoral vein, Right femoral vein, Left popliteal vein, Right popliteal vein, Right distal vein  Patient without prior history of VTE diagnosed with bilateral DVTs 02/24/24. Due to involvement of the right common femoral vein and presence of extensive bilateral DVTs, discussed patient with Dr. Vikki Graves during her initial DVT Clinic visit. Patient's extended travel (8  hour in car) on 02/23/24 is unlikely to be the primary provoking cause, as her RLE edema had been present over one month, and her last travel prior to that was many months ago, but the drive could have contributed to worsening of symptoms. Per patient, LLE edema was at baseline. She had some discomfort in both legs but does not describe them as painful. Dr. Vikki Graves recommended starting anticoagulation and he would arrange for her to follow up with him in the next few weeks for further workup including CT venogram. She followed up with Dr. Vikki Graves on 03/17/24 and there was no central DVT or obstructive process seen on CT. He recommended referral to hematology, who performed hypercoagulable workup as well as workup for underlying malignancy and found no causes of her DVT. They have recommended 6 months of anticoagulation (start date 02/24/24, to end in September) with consideration of aspirin 81 mg daily after and lifelong anticoagulation if she were to experience recurrence.   Due to drug interaction with carbamazepine , DOACs are contraindicated and patient requires anticoagulation with warfarin with Lovenox  bridge until INR >2. CBC WNL on 02/24/24. She completed her Lovenox  bridge as of 03/02/24. Carbamazepine  induces warfarin metabolism which decreases warfarin concentrations. As a result, patient will require very close INR monitoring to maintain INR goal 2-3.   INR today is now at goal after after increasing her weekly regimen again following multiple subtherapeutic INRs despite dose adjustment. Will continue her current dose.   PLAN: -Continue 10 mg (2 tablets) on Tuesdays and 7.5 mg (1 and a half tablets) all other days.  -Expected duration of therapy: per hematology pending further workup. Therapy started on 02/24/24. -Patient educated on purpose, proper use and  potential adverse effects of warfarin (Coumadin ). -Counseled on dietary and drug interactions of warfarin. -Discussed importance of taking medication  around the same time every day. -Advised patient of medications to avoid (NSAIDs, aspirin doses >100 mg daily). -Educated that Tylenol  (acetaminophen ) is the preferred analgesic to lower the risk of bleeding. -Advised patient to alert all providers of anticoagulation therapy prior to starting a new medication or having a procedure. -Emphasized importance of monitoring for signs and symptoms of bleeding (abnormal bruising, prolonged bleeding, nose bleeds, bleeding from gums, discolored urine, black tarry stools). -Educated patient to present to the ED if emergent signs and symptoms of new thrombosis occur. -Counseled patient to wear compression stockings daily, removing at night. Elevate legs to help improve swelling.   Follow up: 4 weeks for next INR check in DVT Clinic.   [name] Hoops, PharmD, [place], CPP Deep Vein Thrombosis Clinic Clinical Pharmacist Practitioner  I have evaluated the patient's chart/imaging and refer this patient to the Clinical Pharmacist Practitioner for medication management. I have reviewed the CPP's documentation and agree with her assessment and plan. I was immediately available during the visit for questions and collaboration.   Gareld June, MD

## 2024-05-24 NOTE — Patient Instructions (Signed)
 Description   Continue 10 mg (2 tablets) on Tuesdays and 7.5 mg (1 and a half tablets) all other days.

## 2024-05-25 DIAGNOSIS — G8929 Other chronic pain: Secondary | ICD-10-CM | POA: Diagnosis not present

## 2024-05-25 DIAGNOSIS — M5440 Lumbago with sciatica, unspecified side: Secondary | ICD-10-CM | POA: Diagnosis not present

## 2024-05-25 DIAGNOSIS — R3129 Other microscopic hematuria: Secondary | ICD-10-CM | POA: Diagnosis not present

## 2024-06-16 ENCOUNTER — Other Ambulatory Visit: Payer: Self-pay | Admitting: Internal Medicine

## 2024-06-17 DIAGNOSIS — M25572 Pain in left ankle and joints of left foot: Secondary | ICD-10-CM | POA: Diagnosis not present

## 2024-06-17 DIAGNOSIS — M19072 Primary osteoarthritis, left ankle and foot: Secondary | ICD-10-CM | POA: Diagnosis not present

## 2024-06-17 DIAGNOSIS — M19071 Primary osteoarthritis, right ankle and foot: Secondary | ICD-10-CM | POA: Diagnosis not present

## 2024-06-17 DIAGNOSIS — M47816 Spondylosis without myelopathy or radiculopathy, lumbar region: Secondary | ICD-10-CM | POA: Diagnosis not present

## 2024-06-17 DIAGNOSIS — M545 Low back pain, unspecified: Secondary | ICD-10-CM | POA: Diagnosis not present

## 2024-06-17 DIAGNOSIS — M2170 Unequal limb length (acquired), unspecified site: Secondary | ICD-10-CM | POA: Diagnosis not present

## 2024-06-17 DIAGNOSIS — G8929 Other chronic pain: Secondary | ICD-10-CM | POA: Diagnosis not present

## 2024-06-17 DIAGNOSIS — M25571 Pain in right ankle and joints of right foot: Secondary | ICD-10-CM | POA: Diagnosis not present

## 2024-06-21 ENCOUNTER — Ambulatory Visit: Attending: Surgery | Admitting: Student-PharmD

## 2024-06-21 DIAGNOSIS — Z7901 Long term (current) use of anticoagulants: Secondary | ICD-10-CM | POA: Diagnosis not present

## 2024-06-21 DIAGNOSIS — I82413 Acute embolism and thrombosis of femoral vein, bilateral: Secondary | ICD-10-CM

## 2024-06-21 DIAGNOSIS — M25571 Pain in right ankle and joints of right foot: Secondary | ICD-10-CM | POA: Insufficient documentation

## 2024-06-21 DIAGNOSIS — M2170 Unequal limb length (acquired), unspecified site: Secondary | ICD-10-CM | POA: Insufficient documentation

## 2024-06-21 DIAGNOSIS — M19071 Primary osteoarthritis, right ankle and foot: Secondary | ICD-10-CM | POA: Insufficient documentation

## 2024-06-21 LAB — POCT INR: INR: 3.4 — AB (ref 2.0–3.0)

## 2024-06-21 NOTE — Progress Notes (Cosign Needed)
 DVT Clinic Note  Name: Amanda Mathews     MRN: [id number]     DOB: 06-22-1946     Sex: female  PCP: Kennyth Worth HERO, MD  Today's Visit: Visit Information: Follow Up Visit  Referred to DVT Clinic by: Primary Care - Dr. Kennyth Referred to CPP by: Dr. Sheree Reason for referral:  Chief Complaint  Patient presents with   Warfarin management   HISTORY OF PRESENT ILLNESS: Amanda Mathews is a 78 y.o. female with PMH HTN, HLD, hypothyroidism, osteoporosis, OSA, chronic back pain, trigeminal neuralgia on carbamazepine , who presents after diagnosis of DVT for medication management. Patient was seen by her PCP and reported bilateral lower extremity edema, right worse than left, along with pain for the prior month. Ultrasound showed bilateral acute DVTs and she was referred to DVT Clinic to initiate treatment. No prior personal or family history of VTE. Initially seen in DVT Clinic 02/24/24, initiated anticoagulation with warfarin and Lovenox  bridge due to drug interaction with DOACs and carbamazepine . Dr. Sheree was consulted and CT venogram was ordered. She followed up with Dr. Sheree on 03/17/24 who reviewed her CT scan and he saw no evidence of central DVT or obstructive process. He recommended she see a hematologist. She continues to follow up in DVT Clinic for INR management.  Today, patient arrives in good spirits and is ambulating well with a walker. Continues to have no SOB, CP, palpitations. She has been adherent with compression and elevation. Reports that she had some worsening swelling after a trip to [place] for a doctor's appointment but it improved. Denies abnormal bleeding or bruising. Denies missed or extra doses of warfarin. No changes in medications. She has established with hematology who worked up possible underlying causes of DVT and found none. They recommend 6 months of anticoagulation, and per patient she does not need to see them again.   Positive Thrombotic Risk Factors: Obesity,  Older Age Bleeding Risk Factors: Age >65 years, Anticoagulant therapy  Negative Thrombotic Risk Factors: Previous VTE, Recent surgery (within 3 months), Recent trauma (within 3 months), Recent admission to hospital with acute illness (within 3 months), Paralysis, paresis, or recent plaster cast immobilization of lower extremity, Central venous catheterization, Bed rest >72 hours within 3 months, Sedentary journey lasting >8 hours within 4 weeks, Pregnancy, Recent cesarean section (within 3 months), Estrogen therapy, Testosterone therapy, Within 6 weeks postpartum, Erythropoiesis-stimulating agent, Recent COVID diagnosis (within 3 months), Active cancer, Non-malignant, chronic inflammatory condition, Known thrombophilic condition, Smoking  Rx Insurance Coverage: Medicare Rx Affordability: Lovenox  is $10/month. Warfarin is $4/month. Rx Assistance Provided: None needed at this time Preferred Pharmacy: Lovenox  and warfarin filled at [facility] during initial DVT visit. Refills of warfarin sent to [facility] preferred [facility].   Past Medical History:  Diagnosis Date   Cataract    Osteoporosis    Sleep apnea    Thyroid  disease     Past Surgical History:  Procedure Laterality Date   back stimulator      BUNIONECTOMY     CHOLECYSTECTOMY     FEMUR FRACTURE SURGERY     REPLACEMENT TOTAL KNEE BILATERAL Bilateral     Social History   Socioeconomic History   Marital status: Divorced    Spouse name: Not on file   Number of children: Not on file   Years of education: Not on file   Highest education level: Associate degree: occupational, Scientist, product/process development, or vocational program  Occupational History   Occupation: Retired  Occupation: retired    Comment: [employer]  Tobacco Use   Smoking status: Never   Smokeless tobacco: Never  Vaping Use   Vaping status: Never Used  Substance and Sexual Activity   Alcohol use: Never   Drug use: Never   Sexual activity: Not Currently    Birth  control/protection: Abstinence  Other Topics Concern   Not on file  Social History Narrative   Right handed   Live alone 3 stairs to door   Caffeine 2 times daily   Social Drivers of Health   Financial Resource Strain: Low Risk  (03/09/2024)   Overall Financial Resource Strain (CARDIA)    Difficulty of Paying Living Expenses: Not hard at all  Food Insecurity: Low Risk  (05/25/2024)   Received from [facility]   Hunger Vital Sign    Within the past 12 months, you worried that your food would run out before you got money to buy more: Never true    Within the past 12 months, the food you bought just didn't last and you didn't have money to get more. : Never true  Transportation Needs: No Transportation Needs (05/25/2024)   Received from [facility]    In the past 12 months, has lack of reliable transportation kept you from medical appointments, meetings, work or from getting things needed for daily living? : No  Physical Activity: Insufficiently Active (03/09/2024)   Exercise Vital Sign    Days of Exercise per Week: 2 days    Minutes of Exercise per Session: 30 min  Stress: No Stress Concern Present (03/09/2024)   [employer] of Occupational Health - Occupational Stress Questionnaire    Feeling of Stress : Not at all  Social Connections: Moderately Integrated (03/09/2024)   Social Connection and Isolation Panel    Frequency of Communication with Friends and Family: More than three times a week    Frequency of Social Gatherings with Friends and Family: More than three times a week    Attends Religious Services: More than 4 times per year    Active Member of Clubs or Organizations: Yes    Attends Banker Meetings: More than 4 times per year    Marital Status: Divorced  Intimate Partner Violence: Not At Risk (11/05/2023)   Humiliation, Afraid, Rape, and Kick questionnaire    Fear of Current or Ex-Partner: No    Emotionally Abused: No    Physically  Abused: No    Sexually Abused: No    Family History  Problem Relation Age of Onset   Hypertension Mother    Stroke Mother    Arthritis Sister    COPD Sister    Depression Sister    Diabetes Sister    Alcohol abuse Brother    Hypertension Sister     Allergies as of 06/21/2024 - Review Complete 06/21/2024  Allergen Reaction Noted   Codeine Nausea And Vomiting and Nausea Only 05/11/2018   Hydrocodone  Other (See Comments) 11/05/2023   Hydromorphone Nausea Only and Nausea And Vomiting 05/11/2018   Morphine Nausea Only and Other (See Comments) 05/11/2018   Oxycodone  Other (See Comments) 11/05/2023   Ropinirole  10/06/2020    Current Outpatient Medications on File Prior to Visit  Medication Sig Dispense Refill   acetaminophen  (TYLENOL ) 500 MG tablet Take 500 mg by mouth every 6 (six) hours as needed for mild pain.     ammonium lactate (AMLACTIN) 12 % cream APPLY THIN COAT TO AFFECTED AREAS & RUB  IN TWICE A DAY IN THE MORNING & IN THE EVENING     benzonatate  (TESSALON  PERLES) 100 MG capsule Take 1 capsule (100 mg total) by mouth 3 (three) times daily as needed. 30 capsule 1   Calcium Carb-Cholecalciferol (CALCIUM 600-D PO) Take 600 mg by mouth daily.     carbamazepine  (TEGRETOL ) 200 MG tablet Take 200 mg by mouth 3 (three) times daily.     Cholecalciferol (VITAMIN D3) 125 MCG (5000 UT) CAPS Take 5,000 Units by mouth daily.     denosumab  (PROLIA ) 60 MG/ML SOSY injection Inject 60 mg into the skin every 6 (six) months.     gabapentin (NEURONTIN) 300 MG capsule TAKE 1 CAPSULE BY MOUTH THREE TIMES A DAY (Patient taking differently: Take 300 mg by mouth 2 (two) times daily.) 90 capsule 5   hydrochlorothiazide  (HYDRODIURIL ) 25 MG tablet Take 0.5 tablets (12.5 mg total) by mouth daily.     hydrocortisone 2.5 % cream Apply topically daily as needed.     levothyroxine  (SYNTHROID ) 137 MCG tablet TAKE 1 TABLET BY MOUTH DAILY BEFORE BREAKFAST. 90 tablet 1   methocarbamol  (ROBAXIN ) 750 MG tablet TAKE  1 TABLET BY MOUTH TWICE A DAY AS NEEDED FOR MUSCLE SPASMS 60 tablet 0   nystatin cream (MYCOSTATIN) nystatin 100,000 unit/gram topical cream     simvastatin  (ZOCOR ) 20 MG tablet TAKE 1 TABLET BY MOUTH EVERYDAY AT BEDTIME 90 tablet 3   triamcinolone cream (KENALOG) 0.1 % MIX WITH NYSTATIN AND APPLY A THIN LAYER TO THE AFFECTED AREA TWICE DAILY AS NEEDED     warfarin (COUMADIN ) 5 MG tablet Take 10 mg (2 tabs) on Tuesdays and 7.5 mg (1.5 tabs) all other days. Or as directed by DVT Clinic. 48 tablet 2   No current facility-administered medications on file prior to visit.   REVIEW OF SYSTEMS:  Review of Systems  Respiratory:  Negative for shortness of breath.   Cardiovascular:  Positive for leg swelling. Negative for chest pain and palpitations.  Musculoskeletal:  Positive for myalgias.  Neurological:  Positive for tingling. Negative for dizziness.   PHYSICAL EXAMINATION:  There were no vitals filed for this visit.  Physical Exam Vitals reviewed.  Cardiovascular:     Rate and Rhythm: Normal rate.  Pulmonary:     Effort: Pulmonary effort is normal.  Musculoskeletal:        General: No tenderness.     Right lower leg: Edema (right>left) present.     Left lower leg: Edema present.  Skin:    Findings: Erythema present. No bruising.  Psychiatric:        Mood and Affect: Mood normal.        Behavior: Behavior normal.        Thought Content: Thought content normal.   Villalta Score for Post-Thrombotic Syndrome: Pain: Absent Cramps: Absent Heaviness: Absent Paresthesia: Mild Pruritus: Absent Pretibial Edema: Mild Skin Induration: Absent Hyperpigmentation: Absent Redness: Absent Venous Ectasia: Absent Pain on calf compression: Absent Villalta Preliminary Score: 2 Is venous ulcer present?: No If venous ulcer is present and score is <15, then 15 points total are assigned: Absent Villalta Total Score: 2  LABS:  CBC     Component Value Date/Time   WBC 5.4 04/16/2024 1238   RBC  4.41 04/16/2024 1238   HGB 13.9 04/16/2024 1238   HCT 39.8 04/16/2024 1238   PLT 199 04/16/2024 1238   MCV 90.2 04/16/2024 1238   MCH 31.5 04/16/2024 1238   MCHC 34.9 04/16/2024 1238   RDW  12.8 04/16/2024 1238   LYMPHSABS 0.8 04/16/2024 1238   MONOABS 0.5 04/16/2024 1238   EOSABS 0.1 04/16/2024 1238   BASOSABS 0.0 04/16/2024 1238    Hepatic Function      Component Value Date/Time   PROT 6.4 02/24/2024 0906   ALBUMIN 4.1 02/24/2024 0906   AST 15 02/24/2024 0906   ALT 11 02/24/2024 0906   ALKPHOS 115 02/24/2024 0906   BILITOT 0.5 02/24/2024 0906    Renal Function   Lab Results  Component Value Date   CREATININE 0.96 02/24/2024   CREATININE 0.86 03/20/2023   CREATININE 0.98 05/06/2022    CrCl cannot be calculated (Patient's most recent lab result is older than the maximum 21 days allowed.).   [facility] Vascular Lab Studies:  02/24/24 VAS US  LOWER EXTREMITY VENOUS (DVT)  Summary:  RIGHT:  - Findings consistent with acute deep vein thrombosis involving the right  common femoral vein, right femoral vein, right popliteal vein, and TPT.  - All other veins visualized appear fully compressible and demonstrate  appropriate Doppler characteristics.    LEFT:  - Findings consistent with acute deep vein thrombosis involving the left  femoral vein, and left popliteal vein.  - All other veins visualized appear fully compressible and demonstrate  appropriate Doppler characteristics.   Right Technical Findings:  Unable to visualize the posterior tibial and peroneal veins due to body  habitus and increase lower leg swelling.   Left Technical Findings:  Unable to visualize the posterior tibial and peroneal veins due to body  habitus and increase lower leg swelling.   ASSESSMENT: Location of DVT: Right common femoral vein, Left femoral vein, Right femoral vein, Left popliteal vein, Right popliteal vein, Right distal vein  Patient without prior history of VTE diagnosed with bilateral DVTs  02/24/24. Due to involvement of the right common femoral vein and presence of extensive bilateral DVTs, discussed patient with Dr. Sheree during her initial DVT Clinic visit. Patient's extended travel (8 hour in car) on 02/23/24 is unlikely to be the primary provoking cause, as her RLE edema had been present over one month, and her last travel prior to that was many months ago, but the drive could have contributed to worsening of symptoms. Per patient, LLE edema was at baseline. She had some discomfort in both legs but does not describe them as painful. Dr. Sheree recommended starting anticoagulation and he would arrange for her to follow up with him in the next few weeks for further workup including CT venogram. She followed up with Dr. Sheree on 03/17/24 and there was no central DVT or obstructive process seen on CT. He recommended referral to hematology, who performed hypercoagulable workup as well as workup for underlying malignancy and found no causes of her DVT. They have recommended 6 months of anticoagulation (start date 02/24/24, to end in September) with consideration of aspirin 81 mg daily after and lifelong anticoagulation if she were to experience recurrence.   Due to drug interaction with carbamazepine , DOACs are contraindicated and patient requires anticoagulation with warfarin with Lovenox  bridge until INR >2. CBC WNL on 02/24/24. She completed her Lovenox  bridge as of 03/02/24. Carbamazepine  induces warfarin metabolism which decreases warfarin concentrations. As a result, patient will require very close INR monitoring to maintain INR goal 2-3.   INR today is supratherapeutic. No extra doses, change in diet or medications. Unclear what caused INR to be elevated. Will have her take a decreased dose and have some greens today then continue her current dose. If  still elevated at next check, will make a weekly dose adjustment.  PLAN: -Take 1/2 tablet (2.5 mg) and have a serving of greens today then continue 10  mg (2 tablets) on Tuesdays and 7.5 mg (1 and a half tablets) all other days.  -Expected duration of therapy: per hematology pending further workup. Therapy started on 02/24/24. -Patient educated on purpose, proper use and potential adverse effects of warfarin (Coumadin ). -Counseled on dietary and drug interactions of warfarin. -Discussed importance of taking medication around the same time every day. -Advised patient of medications to avoid (NSAIDs, aspirin doses >100 mg daily). -Educated that Tylenol  (acetaminophen ) is the preferred analgesic to lower the risk of bleeding. -Advised patient to alert all providers of anticoagulation therapy prior to starting a new medication or having a procedure. -Emphasized importance of monitoring for signs and symptoms of bleeding (abnormal bruising, prolonged bleeding, nose bleeds, bleeding from gums, discolored urine, black tarry stools). -Educated patient to present to the ED if emergent signs and symptoms of new thrombosis occur. -Counseled patient to wear compression stockings daily, removing at night. Elevate legs to help improve swelling.   Follow up: 1 week for next INR check in DVT Clinic.   Lum Herald, PharmD, [place], CPP Deep Vein Thrombosis Clinic Clinical Pharmacist Practitioner  I have evaluated the patient's chart/imaging and refer this patient to the Clinical Pharmacist Practitioner for medication management. I have reviewed the CPP's documentation and agree with her assessment and plan. I was immediately available during the visit for questions and collaboration.   Malvina New, MD

## 2024-06-25 ENCOUNTER — Other Ambulatory Visit: Payer: Self-pay | Admitting: Family Medicine

## 2024-06-29 ENCOUNTER — Ambulatory Visit: Attending: Vascular Surgery | Admitting: Student-PharmD

## 2024-06-29 ENCOUNTER — Encounter: Payer: Self-pay | Admitting: Student-PharmD

## 2024-06-29 DIAGNOSIS — Z7901 Long term (current) use of anticoagulants: Secondary | ICD-10-CM

## 2024-06-29 DIAGNOSIS — I82413 Acute embolism and thrombosis of femoral vein, bilateral: Secondary | ICD-10-CM

## 2024-06-29 LAB — POCT INR: INR: 2.8 (ref 2.0–3.0)

## 2024-06-29 NOTE — Patient Instructions (Signed)
 Description   Continue 10 mg (2 tablets) on Tuesdays and 7.5 mg (1 and a half tablets) all other days.

## 2024-06-29 NOTE — Progress Notes (Signed)
 DVT Clinic Note  Name: Amanda Mathews     MRN: [id number]     DOB: July 02, 1946     Sex: female  PCP: Kennyth Worth HERO, MD  Today's Visit: Visit Information: Follow Up Visit  Referred to DVT Clinic by: Primary Care - Dr. Kennyth Referred to CPP by: Dr. Sheree Reason for referral:  Chief Complaint  Patient presents with   Warfarin management   HISTORY OF PRESENT ILLNESS: Amanda Mathews is a 78 y.o. female with PMH HTN, HLD, hypothyroidism, osteoporosis, OSA, chronic back pain, trigeminal neuralgia on carbamazepine , who presents after diagnosis of DVT for medication management. Patient was seen by her PCP and reported bilateral lower extremity edema, right worse than left, along with pain for the prior month. Ultrasound showed bilateral acute DVTs and she was referred to DVT Clinic to initiate treatment. No prior personal or family history of VTE. Initially seen in DVT Clinic 02/24/24, initiated anticoagulation with warfarin and Lovenox  bridge due to drug interaction with DOACs and carbamazepine . Dr. Sheree was consulted and CT venogram was ordered. She followed up with Dr. Sheree on 03/17/24 who reviewed her CT scan and he saw no evidence of central DVT or obstructive process. He recommended she see a hematologist. She continues to follow up in DVT Clinic for INR management.  Today, patient arrives in good spirits and is ambulating well with a walker. Continues to have no SOB, CP, palpitations. She has been adherent with compression and elevation. Denies abnormal bleeding or bruising. Denies missed or extra doses of warfarin. No changes in medications. She has established with hematology who worked up possible underlying causes of DVT and found none. They recommend 6 months of anticoagulation (end mid September), and per patient she does not need to see them again.   Positive Thrombotic Risk Factors: Obesity, Older Age Bleeding Risk Factors: Age >65 years, Anticoagulant therapy  Negative Thrombotic  Risk Factors: Previous VTE, Recent surgery (within 3 months), Recent trauma (within 3 months), Recent admission to hospital with acute illness (within 3 months), Paralysis, paresis, or recent plaster cast immobilization of lower extremity, Central venous catheterization, Bed rest >72 hours within 3 months, Sedentary journey lasting >8 hours within 4 weeks, Pregnancy, Within 6 weeks postpartum, Recent cesarean section (within 3 months), Estrogen therapy, Testosterone therapy, Erythropoiesis-stimulating agent, Recent COVID diagnosis (within 3 months), Active cancer, Non-malignant, chronic inflammatory condition, Known thrombophilic condition, Smoking  Rx Insurance Coverage: Medicare Rx Affordability: Lovenox  is $10/month. Warfarin is $4/month. Rx Assistance Provided: None needed at this time Preferred Pharmacy: Lovenox  and warfarin filled at [facility] during initial DVT visit. Refills of warfarin sent to [facility] preferred [facility].   Past Medical History:  Diagnosis Date   Cataract    Osteoporosis    Sleep apnea    Thyroid  disease     Past Surgical History:  Procedure Laterality Date   back stimulator      BUNIONECTOMY     CHOLECYSTECTOMY     FEMUR FRACTURE SURGERY     REPLACEMENT TOTAL KNEE BILATERAL Bilateral     Social History   Socioeconomic History   Marital status: Divorced    Spouse name: Not on file   Number of children: Not on file   Years of education: Not on file   Highest education level: Associate degree: occupational, Scientist, product/process development, or vocational program  Occupational History   Occupation: Retired    Occupation: retired    Comment:  [employer]  Tobacco Use   Smoking status: Never  Smokeless tobacco: Never  Vaping Use   Vaping status: Never Used  Substance and Sexual Activity   Alcohol use: Never   Drug use: Never   Sexual activity: Not Currently    Birth control/protection: Abstinence  Other Topics Concern   Not on file  Social History Narrative   Right handed    Live alone 3 stairs to door   Caffeine 2 times daily   Social Drivers of Health   Financial Resource Strain: Low Risk  (03/09/2024)   Overall Financial Resource Strain (CARDIA)    Difficulty of Paying Living Expenses: Not hard at all  Food Insecurity: Low Risk  (05/25/2024)   Received from [facility]   Hunger Vital Sign    Within the past 12 months, you worried that your food would run out before you got money to buy more: Never true    Within the past 12 months, the food you bought just didn't last and you didn't have money to get more. : Never true  Transportation Needs: No Transportation Needs (05/25/2024)   Received from [facility]    In the past 12 months, has lack of reliable transportation kept you from medical appointments, meetings, work or from getting things needed for daily living? : No  Physical Activity: Insufficiently Active (03/09/2024)   Exercise Vital Sign    Days of Exercise per Week: 2 days    Minutes of Exercise per Session: 30 min  Stress: No Stress Concern Present (03/09/2024)   [employer] of Occupational Health - Occupational Stress Questionnaire    Feeling of Stress : Not at all  Social Connections: Moderately Integrated (03/09/2024)   Social Connection and Isolation Panel    Frequency of Communication with Friends and Family: More than three times a week    Frequency of Social Gatherings with Friends and Family: More than three times a week    Attends Religious Services: More than 4 times per year    Active Member of Clubs or Organizations: Yes    Attends Banker Meetings: More than 4 times per year    Marital Status: Divorced  Intimate Partner Violence: Not At Risk (11/05/2023)   Humiliation, Afraid, Rape, and Kick questionnaire    Fear of Current or Ex-Partner: No    Emotionally Abused: No    Physically Abused: No    Sexually Abused: No    Family History  Problem Relation Age of Onset   Hypertension Mother     Stroke Mother    Arthritis Sister    COPD Sister    Depression Sister    Diabetes Sister    Alcohol abuse Brother    Hypertension Sister     Allergies as of 06/29/2024 - Review Complete 06/29/2024  Allergen Reaction Noted   Codeine Nausea And Vomiting and Nausea Only 05/11/2018   Hydrocodone  Other (See Comments) 11/05/2023   Hydromorphone Nausea Only and Nausea And Vomiting 05/11/2018   Morphine Nausea Only and Other (See Comments) 05/11/2018   Oxycodone  Other (See Comments) 11/05/2023   Ropinirole  10/06/2020    Current Outpatient Medications on File Prior to Visit  Medication Sig Dispense Refill   acetaminophen  (TYLENOL ) 500 MG tablet Take 500 mg by mouth every 6 (six) hours as needed for mild pain.     ammonium lactate (AMLACTIN) 12 % cream APPLY THIN COAT TO AFFECTED AREAS & RUB IN TWICE A DAY IN THE MORNING & IN THE EVENING     benzonatate  (TESSALON  PERLES)  100 MG capsule Take 1 capsule (100 mg total) by mouth 3 (three) times daily as needed. 30 capsule 1   Calcium Carb-Cholecalciferol (CALCIUM 600-D PO) Take 600 mg by mouth daily.     carbamazepine  (TEGRETOL ) 200 MG tablet Take 200 mg by mouth 3 (three) times daily.     Cholecalciferol (VITAMIN D3) 125 MCG (5000 UT) CAPS Take 5,000 Units by mouth daily.     denosumab  (PROLIA ) 60 MG/ML SOSY injection Inject 60 mg into the skin every 6 (six) months.     gabapentin (NEURONTIN) 300 MG capsule TAKE 1 CAPSULE BY MOUTH THREE TIMES A DAY (Patient taking differently: Take 300 mg by mouth 2 (two) times daily.) 90 capsule 5   hydrochlorothiazide  (HYDRODIURIL ) 25 MG tablet Take 0.5 tablets (12.5 mg total) by mouth daily.     hydrocortisone 2.5 % cream Apply topically daily as needed.     levothyroxine  (SYNTHROID ) 137 MCG tablet TAKE 1 TABLET BY MOUTH EVERY DAY BEFORE BREAKFAST 90 tablet 1   methocarbamol  (ROBAXIN ) 750 MG tablet TAKE 1 TABLET BY MOUTH TWICE A DAY AS NEEDED FOR MUSCLE SPASMS 60 tablet 0   nystatin cream (MYCOSTATIN)  nystatin 100,000 unit/gram topical cream     simvastatin  (ZOCOR ) 20 MG tablet TAKE 1 TABLET BY MOUTH EVERYDAY AT BEDTIME 90 tablet 3   triamcinolone cream (KENALOG) 0.1 % MIX WITH NYSTATIN AND APPLY A THIN LAYER TO THE AFFECTED AREA TWICE DAILY AS NEEDED     warfarin (COUMADIN ) 5 MG tablet Take 10 mg (2 tabs) on Tuesdays and 7.5 mg (1.5 tabs) all other days. Or as directed by DVT Clinic. 48 tablet 2   No current facility-administered medications on file prior to visit.   REVIEW OF SYSTEMS:  Review of Systems  Respiratory:  Negative for shortness of breath.   Cardiovascular:  Positive for leg swelling. Negative for chest pain and palpitations.  Musculoskeletal:  Positive for myalgias.  Neurological:  Positive for tingling. Negative for dizziness.   PHYSICAL EXAMINATION:  There were no vitals filed for this visit.  Physical Exam Vitals reviewed.  Cardiovascular:     Rate and Rhythm: Normal rate.  Pulmonary:     Effort: Pulmonary effort is normal.  Musculoskeletal:        General: No tenderness.     Right lower leg: Edema (right>left) present.     Left lower leg: Edema present.  Skin:    Findings: Erythema present. No bruising.  Psychiatric:        Mood and Affect: Mood normal.        Behavior: Behavior normal.        Thought Content: Thought content normal.   Villalta Score for Post-Thrombotic Syndrome: Pain: Absent Cramps: Absent Heaviness: Absent Paresthesia: Mild Pruritus: Absent Pretibial Edema: Mild Hyperpigmentation: Absent Redness: Absent Venous Ectasia: Absent Pain on calf compression: Absent Is venous ulcer present?: No If venous ulcer is present and score is <15, then 15 points total are assigned: Absent  LABS:  CBC     Component Value Date/Time   WBC 5.4 04/16/2024 1238   RBC 4.41 04/16/2024 1238   HGB 13.9 04/16/2024 1238   HCT 39.8 04/16/2024 1238   PLT 199 04/16/2024 1238   MCV 90.2 04/16/2024 1238   MCH 31.5 04/16/2024 1238   MCHC 34.9  04/16/2024 1238   RDW 12.8 04/16/2024 1238   LYMPHSABS 0.8 04/16/2024 1238   MONOABS 0.5 04/16/2024 1238   EOSABS 0.1 04/16/2024 1238   BASOSABS 0.0 04/16/2024 1238  Hepatic Function      Component Value Date/Time   PROT 6.4 02/24/2024 0906   ALBUMIN 4.1 02/24/2024 0906   AST 15 02/24/2024 0906   ALT 11 02/24/2024 0906   ALKPHOS 115 02/24/2024 0906   BILITOT 0.5 02/24/2024 0906    Renal Function   Lab Results  Component Value Date   CREATININE 0.96 02/24/2024   CREATININE 0.86 03/20/2023   CREATININE 0.98 05/06/2022    CrCl cannot be calculated (Patient's most recent lab result is older than the maximum 21 days allowed.).   [facility] Vascular Lab Studies:  02/24/24 VAS US  LOWER EXTREMITY VENOUS (DVT)  Summary:  RIGHT:  - Findings consistent with acute deep vein thrombosis involving the right  common femoral vein, right femoral vein, right popliteal vein, and TPT.  - All other veins visualized appear fully compressible and demonstrate  appropriate Doppler characteristics.    LEFT:  - Findings consistent with acute deep vein thrombosis involving the left  femoral vein, and left popliteal vein.  - All other veins visualized appear fully compressible and demonstrate  appropriate Doppler characteristics.   Right Technical Findings:  Unable to visualize the posterior tibial and peroneal veins due to body  habitus and increase lower leg swelling.   Left Technical Findings:  Unable to visualize the posterior tibial and peroneal veins due to body  habitus and increase lower leg swelling.   ASSESSMENT: Location of DVT: Right common femoral vein, Left femoral vein, Right femoral vein, Left popliteal vein, Right popliteal vein, Right distal vein  Patient without prior history of VTE diagnosed with bilateral DVTs 02/24/24. Due to involvement of the right common femoral vein and presence of extensive bilateral DVTs, discussed patient with Dr. Sheree during her initial DVT Clinic visit.  Patient's extended travel (8 hour in car) on 02/23/24 is unlikely to be the primary provoking cause, as her RLE edema had been present over one month, and her last travel prior to that was many months ago, but the drive could have contributed to worsening of symptoms. Per patient, LLE edema was at baseline. She had some discomfort in both legs but does not describe them as painful. Dr. Sheree recommended starting anticoagulation and he would arrange for her to follow up with him in the next few weeks for further workup including CT venogram. She followed up with Dr. Sheree on 03/17/24 and there was no central DVT or obstructive process seen on CT. He recommended referral to hematology, who performed hypercoagulable workup as well as workup for underlying malignancy and found no causes of her DVT. They have recommended 6 months of anticoagulation (start date 02/24/24, to end in September) with consideration of aspirin 81 mg daily after and lifelong anticoagulation if she were to experience recurrence.   Due to drug interaction with carbamazepine , DOACs are contraindicated and patient requires anticoagulation with warfarin with Lovenox  bridge until INR >2. CBC WNL on 02/24/24. She completed her Lovenox  bridge as of 03/02/24. Carbamazepine  induces warfarin metabolism which decreases warfarin concentrations. As a result, patient will require very close INR monitoring to maintain INR goal 2-3.   INR today returned to being therapeutic. Continue current dose.   PLAN: -Continue 10 mg (2 tablets) on Tuesdays and 7.5 mg (1 and a half tablets) all other days.  -Expected duration of therapy: 6 months per hematology. Therapy started on 02/24/24. -Patient educated on purpose, proper use and potential adverse effects of warfarin (Coumadin ). -Counseled on dietary and drug interactions of warfarin. -Discussed importance of  taking medication around the same time every day. -Advised patient of medications to avoid (NSAIDs, aspirin  doses >100 mg daily). -Educated that Tylenol  (acetaminophen ) is the preferred analgesic to lower the risk of bleeding. -Advised patient to alert all providers of anticoagulation therapy prior to starting a new medication or having a procedure. -Emphasized importance of monitoring for signs and symptoms of bleeding (abnormal bruising, prolonged bleeding, nose bleeds, bleeding from gums, discolored urine, black tarry stools). -Educated patient to present to the ED if emergent signs and symptoms of new thrombosis occur. -Counseled patient to wear compression stockings daily, removing at night. Elevate legs to help improve swelling.   Follow up: 3 weeks for next INR check in DVT Clinic.   Lum Herald, PharmD, [place], CPP Deep [facility] Thrombosis Clinic Clinical Pharmacist Practitioner [phone]

## 2024-06-29 NOTE — Progress Notes (Signed)
 I have reviewed the patient's encounter and agree with the documentation.  Worth HERO. Kennyth, MD 06/29/2024 11:37 AM

## 2024-07-01 DIAGNOSIS — M47816 Spondylosis without myelopathy or radiculopathy, lumbar region: Secondary | ICD-10-CM | POA: Diagnosis not present

## 2024-07-01 DIAGNOSIS — M545 Low back pain, unspecified: Secondary | ICD-10-CM | POA: Diagnosis not present

## 2024-07-16 ENCOUNTER — Other Ambulatory Visit: Payer: Self-pay | Admitting: Internal Medicine

## 2024-07-20 ENCOUNTER — Ambulatory Visit: Payer: Self-pay

## 2024-07-20 ENCOUNTER — Ambulatory Visit: Attending: Vascular Surgery | Admitting: Student-PharmD

## 2024-07-20 ENCOUNTER — Encounter: Payer: Self-pay | Admitting: Internal Medicine

## 2024-07-20 ENCOUNTER — Encounter: Payer: Self-pay | Admitting: Student-PharmD

## 2024-07-20 ENCOUNTER — Ambulatory Visit (INDEPENDENT_AMBULATORY_CARE_PROVIDER_SITE_OTHER): Admitting: Internal Medicine

## 2024-07-20 VITALS — BP 132/82 | HR 77 | Temp 98.0°F | Ht 60.0 in | Wt 188.0 lb

## 2024-07-20 DIAGNOSIS — Z7901 Long term (current) use of anticoagulants: Secondary | ICD-10-CM

## 2024-07-20 DIAGNOSIS — L03114 Cellulitis of left upper limb: Secondary | ICD-10-CM | POA: Diagnosis not present

## 2024-07-20 DIAGNOSIS — I82413 Acute embolism and thrombosis of femoral vein, bilateral: Secondary | ICD-10-CM | POA: Diagnosis not present

## 2024-07-20 LAB — POCT INR: INR: 2.5 (ref 2.0–3.0)

## 2024-07-20 MED ORDER — CEPHALEXIN 500 MG PO CAPS: 500.0000 mg | ORAL_CAPSULE | Freq: Three times a day (TID) | ORAL | 0 refills | Status: AC

## 2024-07-20 NOTE — Telephone Encounter (Signed)
 Appt today

## 2024-07-20 NOTE — Patient Instructions (Addendum)
       Medications changes include :   keflex  three times a day x 10 days    Let your coumadin  nurse you have been placed on keflex .     Return if symptoms worsen or fail to improve.

## 2024-07-20 NOTE — Progress Notes (Signed)
 DVT Clinic Note  Name: Amanda Mathews     MRN: [id number]     DOB: 05-16-1946     Sex: female  PCP: Kennyth Worth HERO, MD  Today's Visit: Visit Information: Follow Up Visit  Referred to DVT Clinic by: Primary Care - Dr. Kennyth Referred to CPP by: Dr. Sheree Reason for referral:  Chief Complaint  Patient presents with   Warfarin management   HISTORY OF PRESENT ILLNESS: Amanda Mathews is a 78 y.o. female with PMH HTN, HLD, hypothyroidism, osteoporosis, OSA, chronic back pain, trigeminal neuralgia on carbamazepine , who presents after diagnosis of DVT for medication management. Patient was seen by her PCP and reported bilateral lower extremity edema, right worse than left, along with pain for the prior month. Ultrasound showed bilateral acute DVTs and she was referred to DVT Clinic to initiate treatment. No prior personal or family history of VTE. Initially seen in DVT Clinic 02/24/24, initiated anticoagulation with warfarin and Lovenox  bridge due to drug interaction with DOACs and carbamazepine . Dr. Sheree was consulted and CT venogram was ordered. She followed up with Dr. Sheree on 03/17/24 who reviewed her CT scan and he saw no evidence of central DVT or obstructive process. He recommended she see a hematologist. She continues to follow up in DVT Clinic for INR management.   Today, patient arrives in good spirits and is ambulating well with a walker. Continues to have no SOB, CP, palpitations. She has been adherent with compression and elevation. Denies abnormal bleeding or bruising. Denies missed or extra doses of warfarin. No changes in medications. She has established with hematology who worked up possible underlying causes of DVT and found none. They recommend 6 months of anticoagulation (end mid September) as the DVT was felt to be likely triggered by long-distance travel and per patient she does not need to see them again. Shows me a place on her left arm that started on Sunday that is red,  swollen, warm, and painful to the touch.   Positive Thrombotic Risk Factors: Obesity, Older Age Bleeding Risk Factors: Age >65 years, Anticoagulant therapy  Negative Thrombotic Risk Factors: Previous VTE, Recent surgery (within 3 months), Recent trauma (within 3 months), Recent admission to hospital with acute illness (within 3 months), Paralysis, paresis, or recent plaster cast immobilization of lower extremity, Central venous catheterization, Bed rest >72 hours within 3 months, Sedentary journey lasting >8 hours within 4 weeks, Pregnancy, Within 6 weeks postpartum, Recent cesarean section (within 3 months), Estrogen therapy, Testosterone therapy, Erythropoiesis-stimulating agent, Recent COVID diagnosis (within 3 months), Active cancer, Non-malignant, chronic inflammatory condition, Known thrombophilic condition, Smoking  Rx Insurance Coverage: Medicare Rx Affordability: Lovenox  is $10/month. Warfarin is $4/month. Rx Assistance Provided: None needed at this time Preferred Pharmacy: Lovenox  and warfarin filled at [facility] during initial DVT visit. Refills of warfarin sent [facility] preferred [facility].   Past Medical History:  Diagnosis Date   Cataract    Osteoporosis    Sleep apnea    Thyroid  disease     Past Surgical History:  Procedure Laterality Date   back stimulator      BUNIONECTOMY     CHOLECYSTECTOMY     FEMUR FRACTURE SURGERY     REPLACEMENT TOTAL KNEE BILATERAL Bilateral     Social History   Socioeconomic History   Marital status: Divorced    Spouse name: Not on file   Number of children: Not on file   Years of education: Not on file   Highest  education level: Associate degree: occupational, Scientist, product/process development, or vocational program  Occupational History   Occupation: Retired    Occupation: retired    Comment: [employer]  Tobacco Use   Smoking status: Never   Smokeless tobacco: Never  Vaping Use   Vaping status: Never Used  Substance and Sexual Activity   Alcohol use:  Never   Drug use: Never   Sexual activity: Not Currently    Birth control/protection: Abstinence  Other Topics Concern   Not on file  Social History Narrative   Right handed   Live alone 3 stairs to door   Caffeine 2 times daily   Social Drivers of Health   Financial Resource Strain: Low Risk  (03/09/2024)   Overall Financial Resource Strain (CARDIA)    Difficulty of Paying Living Expenses: Not hard at all  Food Insecurity: Low Risk  (05/25/2024)   Received from [facility]   Hunger Vital Sign    Within the past 12 months, you worried that your food would run out before you got money to buy more: Never true    Within the past 12 months, the food you bought just didn't last and you didn't have money to get more. : Never true  Transportation Needs: No Transportation Needs (05/25/2024)   Received from [facility]    In the past 12 months, has lack of reliable transportation kept you from medical appointments, meetings, work or from getting things needed for daily living? : No  Physical Activity: Insufficiently Active (03/09/2024)   Exercise Vital Sign    Days of Exercise per Week: 2 days    Minutes of Exercise per Session: 30 min  Stress: No Stress Concern Present (03/09/2024)   [employer] of Occupational Health - Occupational Stress Questionnaire    Feeling of Stress : Not at all  Social Connections: Moderately Integrated (03/09/2024)   Social Connection and Isolation Panel    Frequency of Communication with Friends and Family: More than three times a week    Frequency of Social Gatherings with Friends and Family: More than three times a week    Attends Religious Services: More than 4 times per year    Active Member of Clubs or Organizations: Yes    Attends Banker Meetings: More than 4 times per year    Marital Status: Divorced  Intimate Partner Violence: Not At Risk (11/05/2023)   Humiliation, Afraid, Rape, and Kick questionnaire    Fear of  Current or Ex-Partner: No    Emotionally Abused: No    Physically Abused: No    Sexually Abused: No    Family History  Problem Relation Age of Onset   Hypertension Mother    Stroke Mother    Arthritis Sister    COPD Sister    Depression Sister    Diabetes Sister    Alcohol abuse Brother    Hypertension Sister     Allergies as of 07/20/2024 - Review Complete 07/20/2024  Allergen Reaction Noted   Codeine Nausea And Vomiting and Nausea Only 05/11/2018   Hydrocodone  Other (See Comments) 11/05/2023   Hydromorphone Nausea Only and Nausea And Vomiting 05/11/2018   Morphine Nausea Only and Other (See Comments) 05/11/2018   Oxycodone  Other (See Comments) 11/05/2023   Ropinirole  10/06/2020    Current Outpatient Medications on File Prior to Visit  Medication Sig Dispense Refill   acetaminophen  (TYLENOL ) 325 MG tablet Take 325 mg by mouth every 6 (six) hours as needed for mild  pain (pain score 1-3) or moderate pain (pain score 4-6).     ammonium lactate (AMLACTIN) 12 % cream APPLY THIN COAT TO AFFECTED AREAS & RUB IN TWICE A DAY IN THE MORNING & IN THE EVENING     Calcium Carb-Cholecalciferol (CALCIUM 600-D PO) Take 600 mg by mouth daily.     carbamazepine  (TEGRETOL ) 200 MG tablet Take 200 mg by mouth 3 (three) times daily.     Cholecalciferol (VITAMIN D3) 125 MCG (5000 UT) CAPS Take 5,000 Units by mouth daily.     denosumab  (PROLIA ) 60 MG/ML SOSY injection Inject 60 mg into the skin every 6 (six) months.     gabapentin (NEURONTIN) 300 MG capsule TAKE 1 CAPSULE BY MOUTH THREE TIMES A DAY (Patient taking differently: Take 300 mg by mouth 2 (two) times daily.) 90 capsule 5   hydrochlorothiazide  (HYDRODIURIL ) 25 MG tablet Take 0.5 tablets (12.5 mg total) by mouth daily.     hydrocortisone 2.5 % cream Apply topically daily as needed.     levothyroxine  (SYNTHROID ) 137 MCG tablet TAKE 1 TABLET BY MOUTH EVERY DAY BEFORE BREAKFAST 90 tablet 1   methocarbamol  (ROBAXIN ) 750 MG tablet TAKE 1 TABLET BY  MOUTH TWICE A DAY AS NEEDED FOR MUSCLE SPASM 60 tablet 0   nystatin cream (MYCOSTATIN) nystatin 100,000 unit/gram topical cream     simvastatin  (ZOCOR ) 20 MG tablet TAKE 1 TABLET BY MOUTH EVERYDAY AT BEDTIME 90 tablet 3   triamcinolone cream (KENALOG) 0.1 % MIX WITH NYSTATIN AND APPLY A THIN LAYER TO THE AFFECTED AREA TWICE DAILY AS NEEDED     warfarin (COUMADIN ) 5 MG tablet Take 10 mg (2 tabs) on Tuesdays and 7.5 mg (1.5 tabs) all other days. Or as directed by DVT Clinic. 48 tablet 2   No current facility-administered medications on file prior to visit.   REVIEW OF SYSTEMS:  Review of Systems  Respiratory:  Negative for shortness of breath.   Cardiovascular:  Positive for leg swelling. Negative for chest pain and palpitations.  Musculoskeletal:  Positive for myalgias.  Neurological:  Positive for tingling. Negative for dizziness.   PHYSICAL EXAMINATION:  There were no vitals filed for this visit.  Physical Exam Vitals reviewed.  Cardiovascular:     Rate and Rhythm: Normal rate.  Pulmonary:     Effort: Pulmonary effort is normal.  Musculoskeletal:        General: No tenderness.     Right lower leg: Edema (right>left) present.     Left lower leg: Edema present.  Skin:    Findings: Erythema present. No bruising.  Psychiatric:        Mood and Affect: Mood normal.        Behavior: Behavior normal.        Thought Content: Thought content normal.   Villalta Score for Post-Thrombotic Syndrome: Pain: Absent Cramps: Absent Heaviness: Absent Paresthesia: Mild Pruritus: Absent Pretibial Edema: Mild Skin Induration: Absent Hyperpigmentation: Absent Redness: Absent Venous Ectasia: Absent Pain on calf compression: Absent Villalta Preliminary Score: 2 Is venous ulcer present?: No If venous ulcer is present and score is <15, then 15 points total are assigned: Absent Villalta Total Score: 2  LABS:  CBC     Component Value Date/Time   WBC 5.4 04/16/2024 1238   RBC 4.41  04/16/2024 1238   HGB 13.9 04/16/2024 1238   HCT 39.8 04/16/2024 1238   PLT 199 04/16/2024 1238   MCV 90.2 04/16/2024 1238   MCH 31.5 04/16/2024 1238   MCHC 34.9 04/16/2024  1238   RDW 12.8 04/16/2024 1238   LYMPHSABS 0.8 04/16/2024 1238   MONOABS 0.5 04/16/2024 1238   EOSABS 0.1 04/16/2024 1238   BASOSABS 0.0 04/16/2024 1238    Hepatic Function      Component Value Date/Time   PROT 6.4 02/24/2024 0906   ALBUMIN 4.1 02/24/2024 0906   AST 15 02/24/2024 0906   ALT 11 02/24/2024 0906   ALKPHOS 115 02/24/2024 0906   BILITOT 0.5 02/24/2024 0906    Renal Function   Lab Results  Component Value Date   CREATININE 0.96 02/24/2024   CREATININE 0.86 03/20/2023   CREATININE 0.98 05/06/2022    CrCl cannot be calculated (Patient's most recent lab result is older than the maximum 21 days allowed.).   [facility]VS Vascular Lab Studies:  02/24/24 VAS US  LOWER EXTREMITY VENOUS (DVT)  Summary:  RIGHT:  - Findings consistent with acute deep vein thrombosis involving the right  common femoral vein, right femoral vein, right popliteal vein, and TPT.  - All other veins visualized appear fully compressible and demonstrate  appropriate Doppler characteristics.    LEFT:  - Findings consistent with acute deep vein thrombosis involving the left  femoral vein, and left popliteal vein.  - All other veins visualized appear fully compressible and demonstrate  appropriate Doppler characteristics.   Right Technical Findings:  Unable to visualize the posterior tibial and peroneal veins due to body  habitus and increase lower leg swelling.   Left Technical Findings:  Unable to visualize the posterior tibial and peroneal veins due to body  habitus and increase lower leg swelling.   ASSESSMENT: Location of DVT: Right common femoral vein, Left femoral vein, Right femoral vein, Left popliteal vein, Right popliteal vein, Right distal vein  Patient without prior history of VTE diagnosed with bilateral DVTs  02/24/24. Due to involvement of the right common femoral vein and presence of extensive bilateral DVTs, discussed patient with Dr. Sheree during her initial DVT Clinic visit. Patient's extended travel (8 hour in car) on 02/23/24 is unlikely to be the primary provoking cause, as her RLE edema had been present over one month. Did have a drive to the [facility] coast and back for a doctors appointment but this was a couple months prior. Per patient, LLE edema was at baseline. She had some discomfort in both legs but does not describe them as painful. Dr. Sheree recommended starting anticoagulation and he would arrange for her to follow up with him in the next few weeks for further workup including CT venogram. She followed up with Dr. Sheree on 03/17/24 and there was no central DVT or obstructive process seen on CT. He recommended referral to hematology, who performed hypercoagulable workup as well as workup for underlying malignancy and found no causes of her DVT. Felt to be related to long distance travel. They have recommended 6 months of anticoagulation (start date 02/24/24, to end in September) with consideration of aspirin 81 mg daily after and lifelong anticoagulation if she were to experience recurrence.   Due to drug interaction with carbamazepine , DOACs are contraindicated and patient requires anticoagulation with warfarin with Lovenox  bridge until INR >2. CBC WNL on 02/24/24. She completed her Lovenox  bridge as of 03/02/24. Carbamazepine  induces warfarin metabolism which decreases warfarin concentrations. As a result, patient will require very close INR monitoring to maintain INR goal 2-3.   INR today returned to being therapeutic. Continue current dose. Nearing end of 6 month treatment, will discuss ending treatment at next visit.   Of  note, patient showed me a place on her arm that has a distinct area that is red, warm, swollen, and painful. With where the site is located on her arm and with consistently therapeutic INRs  lately, would not be concerned for new DVT. Encouraged her to reach out to her PCP for further evaluation.   PLAN: -Continue 10 mg (2 tablets) on Tuesdays and 7.5 mg (1 and a half tablets) all other days.  -Expected duration of therapy: 6 months per hematology. Therapy started on 02/24/24. -Patient educated on purpose, proper use and potential adverse effects of warfarin (Coumadin ). -Counseled on dietary and drug interactions of warfarin. -Discussed importance of taking medication around the same time every day. -Advised patient of medications to avoid (NSAIDs, aspirin doses >100 mg daily). -Educated that Tylenol  (acetaminophen ) is the preferred analgesic to lower the risk of bleeding. -Advised patient to alert all providers of anticoagulation therapy prior to starting a new medication or having a procedure. -Emphasized importance of monitoring for signs and symptoms of bleeding (abnormal bruising, prolonged bleeding, nose bleeds, bleeding from gums, discolored urine, black tarry stools). -Educated patient to present to the ED if emergent signs and symptoms of new thrombosis occur. -Counseled patient to wear compression stockings daily, removing at night. Elevate legs to help improve swelling.   Follow up: 1 month for next INR check in DVT Clinic and end of treatment visit.  Lum Herald, PharmD, [place], CPP Deep Vein Thrombosis Clinic Clinical Pharmacist Practitioner [phone]

## 2024-07-20 NOTE — Telephone Encounter (Signed)
  FYI Only or Action Required?: FYI only for provider.  Patient was last seen in primary care on 03/10/2024 by Job Lukes, PA.  Called Nurse Triage reporting skin lesion.  Symptoms began several days ago.  Interventions attempted: OTC medications: neosporin.  Symptoms are: gradually worsening.  Triage Disposition: See Physician Within 24 Hours  Patient/caregiver understands and will follow disposition?: Yes Copied from CRM #[id number]. Topic: Clinical - Red Word Triage >> Jul 20, 2024 10:42 AM Amanda Mathews wrote: Red Word that prompted transfer to Nurse Triage: Patient noticed a spot on her left forearm Saturday, the area is red, swollen, and warm to the touch. Reason for Disposition  Nursing judgment or information in reference  Answer Assessment - Initial Assessment Questions 1. REASON FOR CALL: What is your main concern right now?     Redness to left forearm 2. ONSET: When did the redness start?     Saturday (three days ago) 3. SEVERITY: How bad is the redness?     About two inches, warm, tender to touch 4. FUNCTIONAL IMPAIRMENT: How have things been going for you overall? Have you had more difficulty than usual doing your normal daily activities? (e.g., self-care, school, work, interactions)     Generally unaffected, just extra care 5. RELIEVING AND AGGRAVATING FACTORS: What makes it better or worse? (e.g., certain activities, rest)     Worse with time 6. FEVER: Do you have a fever?     denies 7. OTHER SYMPTOMS: Do you have any other new symptoms?     N/A 8. TREATMENTS AND RESPONSE: What have you done so far to try to make this better? What medicines have you used?     neosporin 9. PREGNANCY: Is there any chance you are pregnant? When was your last menstrual period?     N/a  Recommended by vascular that she be evaluated Called CAL at [facility], patient scheduled at [facility] for evaluation today  Protocols used: No Guideline Available-A-AH

## 2024-07-20 NOTE — Progress Notes (Signed)
 Subjective:    Patient ID: Amanda Mathews, female    DOB: 03/19/1946, 78 y.o.   MRN: [id number]      HPI Amanda Mathews is here for  Chief Complaint  Patient presents with   Joint Swelling    Swelling on left forearm (redness and swelling) Noticed on Saturday; slightly tender to touch    She noticed an area of redness on her left forearm 3 days ago.  Initially it was a small area that was painful and slightly red.  Since then the area of redness has gotten larger.  She denies any fevers or chills.  The arm is warm to touch.  Approximately 3 weeks ago she did scrape her arm in that area and she is not sure if that is related to this or not.  She denies any bug bites.  She denies any other trauma that she is aware of.      Medications and allergies reviewed with patient and updated if appropriate.  Current Outpatient Medications on File Prior to Visit  Medication Sig Dispense Refill   acetaminophen  (TYLENOL ) 325 MG tablet Take 325 mg by mouth every 6 (six) hours as needed for mild pain (pain score 1-3) or moderate pain (pain score 4-6).     ammonium lactate (AMLACTIN) 12 % cream APPLY THIN COAT TO AFFECTED AREAS & RUB IN TWICE A DAY IN THE MORNING & IN THE EVENING     Calcium Carb-Cholecalciferol (CALCIUM 600-D PO) Take 600 mg by mouth daily.     carbamazepine  (TEGRETOL ) 200 MG tablet Take 200 mg by mouth 3 (three) times daily.     Cholecalciferol (VITAMIN D3) 125 MCG (5000 UT) CAPS Take 5,000 Units by mouth daily.     denosumab  (PROLIA ) 60 MG/ML SOSY injection Inject 60 mg into the skin every 6 (six) months.     gabapentin (NEURONTIN) 300 MG capsule TAKE 1 CAPSULE BY MOUTH THREE TIMES A DAY (Patient taking differently: Take 300 mg by mouth 2 (two) times daily.) 90 capsule 5   hydrochlorothiazide  (HYDRODIURIL ) 25 MG tablet Take 0.5 tablets (12.5 mg total) by mouth daily.     hydrocortisone 2.5 % cream Apply topically daily as needed.     levothyroxine  (SYNTHROID ) 137 MCG tablet  TAKE 1 TABLET BY MOUTH EVERY DAY BEFORE BREAKFAST 90 tablet 1   methocarbamol  (ROBAXIN ) 750 MG tablet TAKE 1 TABLET BY MOUTH TWICE A DAY AS NEEDED FOR MUSCLE SPASM 60 tablet 0   nystatin cream (MYCOSTATIN) nystatin 100,000 unit/gram topical cream     simvastatin  (ZOCOR ) 20 MG tablet TAKE 1 TABLET BY MOUTH EVERYDAY AT BEDTIME 90 tablet 3   triamcinolone cream (KENALOG) 0.1 % MIX WITH NYSTATIN AND APPLY A THIN LAYER TO THE AFFECTED AREA TWICE DAILY AS NEEDED     warfarin (COUMADIN ) 5 MG tablet Take 10 mg (2 tabs) on Tuesdays and 7.5 mg (1.5 tabs) all other days. Or as directed by DVT Clinic. 48 tablet 2   No current facility-administered medications on file prior to visit.    Review of Systems  Constitutional:  Negative for fever.  Skin:  Positive for color change. Negative for wound.  Neurological:  Negative for numbness.       Objective:   Vitals:   07/20/24 1531  BP: 132/82  Pulse: 77  Temp: 98 F (36.7 C)  SpO2: 97%   BP Readings from Last 3 Encounters:  07/20/24 132/82  05/24/24 126/74  05/20/24 115/72   Wt Readings from Last  3 Encounters:  07/20/24 188 lb (85.3 kg)  05/20/24 193 lb 11.2 oz (87.9 kg)  03/17/24 191 lb (86.6 kg)   Body mass index is 36.72 kg/m.    Physical Exam Constitutional:      General: She is not in acute distress.    Appearance: Normal appearance. She is not ill-appearing.  HENT:     Head: Normocephalic and atraumatic.  Skin:    General: Skin is warm and dry.     Comments: Left forearm with a focal area of tenderness and redness that extends from posterior forearm to anterior associated with slight swelling and area is warm to touch.  No area of fluctuation.  No open wounds or breaks in skin.  No streaking.  No numbness or tingling.   Neurological:     Mental Status: She is alert.              Assessment & Plan:    Encounter Diagnosis  Name Primary?   Cellulitis of left forearm Yes   Acute Started 3 days ago and has  progressed She did have an injury to her forearm about 3 weeks ago which may have potentially been the cause-no other obvious cause She is on warfarin so will start Keflex  500 mg 3 times daily x 10 days She will call the Coumadin  nurse and I will send her note advising that she has been started on this medication She will monitor the area closely-discussed that if this is not improving or getting any worse, especially if 48 hours she needs to follow-up

## 2024-07-26 ENCOUNTER — Ambulatory Visit: Admitting: Physician Assistant

## 2024-07-26 ENCOUNTER — Ambulatory Visit: Payer: Self-pay

## 2024-07-26 ENCOUNTER — Encounter: Payer: Self-pay | Admitting: Physician Assistant

## 2024-07-26 VITALS — BP 136/86 | HR 82 | Temp 97.9°F | Ht 60.0 in | Wt 186.4 lb

## 2024-07-26 DIAGNOSIS — L03114 Cellulitis of left upper limb: Secondary | ICD-10-CM | POA: Diagnosis not present

## 2024-07-26 NOTE — Telephone Encounter (Signed)
 FYI Only or Action Required?: Action required by provider: request for appointment.  Patient was last seen in primary care on 07/20/2024 by Geofm Glade PARAS, MD.  Called Nurse Triage reporting Arm Swelling.  Symptoms began yesterday.  Interventions attempted: Prescription medications: Keflex .  Symptoms are: gradually worsening. Seen 07/20/24 with cellulitis and started Keflex . Now has a goose egg size, red lump to arm.   Triage Disposition: See HCP Within 4 Hours (Or PCP Triage)  Patient/caregiver understands and will follow disposition?: Yes   Copied from CRM #[id number]. Topic: Clinical - Red Word Triage >> Jul 26, 2024  8:09 AM Emylou G wrote: [facility] that prompted transfer to Nurse Triage: having swelling ( knot - hard - spread ) turned up where the left arm after she was seen last week she can't lay on that part - forced to lay elevated.  Black hairs is growing from it?  Still has 3 more days of meds to take Reason for Disposition  [1] Red area or streak [2] large (> 2 inches or 5 cm)  Answer Assessment - Initial Assessment Questions 1. ONSET: When did the swelling start? (e.g., minutes, hours, days)     weekend 2. LOCATION: What part of the arm is swollen?  Are both arms swollen or just one arm?     left 3. SEVERITY: How bad is the swelling? (e.g., localized; mild, moderate, severe)     Goose egg 4. REDNESS: Is there redness or signs of infection?     yes 5. PAIN: Is the swelling painful to touch? If Yes, ask: How painful is it?   (Scale 1-10; mild, moderate or severe)     no 6. FEVER: Do you have a fever? If Yes, ask: What is it, how was it measured, and when did it start?      no 7. CAUSE: What do you think is causing the arm swelling?     cellulitis 8. MEDICAL HISTORY: Do you have a history of heart failure, kidney disease, liver failure, or cancer?     no 9. RECURRENT SYMPTOM: Have you had arm swelling before? If Yes, ask: When was the last time?  What happened that time?     no 10. OTHER SYMPTOMS: Do you have any other symptoms? (e.g., chest pain, difficulty breathing)       no 11. PREGNANCY: Is there any chance you are pregnant? When was your last menstrual period?       no  Protocols used: Arm Swelling and Edema-A-AH

## 2024-07-26 NOTE — Progress Notes (Signed)
 Patient ID: Amanda Mathews, female    DOB: Sep 07, 1946, 78 y.o.   MRN: [id number]   Assessment & Plan:  Cellulitis of left forearm      Assessment and Plan Assessment & Plan Left forearm cellulitis with abscess formation Left forearm cellulitis with recent abscess formation. Initial injury occurred approximately six weeks ago, with onset of symptoms a few weeks after. Currently, the redness has improved, but a new tender knot suggests abscess formation. No history of skin infections. No fever or chills reported. Currently on Keflex  with three and a half days remaining. No gastrointestinal side effects from antibiotics. The abscess is not yet ready for drainage. - Continue Keflex  as prescribed, completing the course. Considered adding additional antibiotic, but with report of continued improvement, will just be aggressive with warm compresses and give Keflex  more time. - Apply warm compresses to the affected area three times a day for the next couple of days. - Re-evaluate in two days to assess the need for drainage. - Instruct to call if symptoms worsen, such as increased redness, pain, or fever. - Schedule follow-up appointment in two days.      Return in about 2 days (around 07/28/2024) for recheck/follow-up (Dr. Kennyth has a 10 am) .    Subjective:    Chief Complaint  Patient presents with   Edema    Pt in office due to edema in arm; was seen last week and started antibiotics, pt concerned with knot on her left arm that came up Saturday evening; pt c/o slight pain;     HPI Discussed the use of AI scribe software for clinical note transcription with the patient, who gave verbal consent to proceed.  History of Present Illness [name] is a 78 year old female on chronic Coumadin  therapy who presents with left forearm swelling, redness, and pain. She is accompanied by her twin sister, Rhoda.  She initially injured her left forearm approximately six weeks  ago by scraping it on a [place] door. Symptoms of swelling, redness, and pain began to appear a few weeks after the incident.  She was initially seen on July 20, 2024, at another office and was started on Keflex  500 mg TID for ten days. Since her last visit, she developed a new painful 'knot' on her arm two nights ago, which appears to be forming a head, resembling an abscess. The area remains red, though less so than before, and more tender.  She has no history of skin infections, fevers, or chills. She has been using heating pads as a compress on the area.  She is currently taking Keflex  with about three and a half days remaining in her course. Her stomach is tolerating the antibiotics well. She is also on chronic Coumadin  therapy and has been working with her Coumadin  nurse for adjustments, with her next appointment scheduled for mid-September.  During the review of symptoms, she denies any fever, chills, or significant increase in pain. She confirms that she can move her arm without significant discomfort.     Past Medical History:  Diagnosis Date   Cataract    Osteoporosis    Sleep apnea    Thyroid  disease     Past Surgical History:  Procedure Laterality Date   back stimulator      BUNIONECTOMY     CHOLECYSTECTOMY     FEMUR FRACTURE SURGERY     REPLACEMENT TOTAL KNEE BILATERAL Bilateral     Family History  Problem Relation Age  of Onset   Hypertension Mother    Stroke Mother    Arthritis Sister    COPD Sister    Depression Sister    Diabetes Sister    Alcohol abuse Brother    Hypertension Sister     Social History   Tobacco Use   Smoking status: Never   Smokeless tobacco: Never  Vaping Use   Vaping status: Never Used  Substance Use Topics   Alcohol use: Never   Drug use: Never     Allergies  Allergen Reactions   Codeine Nausea And Vomiting and Nausea Only    Other reaction(s): vomiting   Hydrocodone  Other (See Comments)    Hallucinate    Hydromorphone  Nausea Only and Nausea And Vomiting   Morphine Nausea Only and Other (See Comments)    Hallucinations  Other reaction(s): Delusions (intolerance), hallucinations, Other (See Comments) Hallucinations     Oxycodone  Other (See Comments)    Sick and nervous   Ropinirole     hallucinations Other reaction(s): Delusions (intolerance) hallucinations    Review of Systems NEGATIVE UNLESS OTHERWISE INDICATED IN HPI      Objective:     BP 136/86 (BP Location: Right Arm, Patient Position: Sitting, Cuff Size: Large)   Pulse 82   Temp 97.9 F (36.6 C) (Temporal)   Ht 5' (1.524 m)   Wt 186 lb 6.4 oz (84.6 kg)   SpO2 95%   BMI 36.40 kg/m   Wt Readings from Last 3 Encounters:  07/26/24 186 lb 6.4 oz (84.6 kg)  07/20/24 188 lb (85.3 kg)  05/20/24 193 lb 11.2 oz (87.9 kg)    BP Readings from Last 3 Encounters:  07/26/24 136/86  07/20/24 132/82  05/24/24 126/74     Physical Exam    Left forearm: ROM, sensation intact fully. Area of redness (cellulitis) appears to be improving from last o/v; new central area of firm, indurated slightly tender lesion, c/w likely subcutaneous abscess       Ellsie Violette M Dustina Scoggin, PA-C

## 2024-07-28 ENCOUNTER — Ambulatory Visit: Admitting: Family Medicine

## 2024-07-28 ENCOUNTER — Encounter: Payer: Self-pay | Admitting: Family Medicine

## 2024-07-28 VITALS — BP 138/78 | HR 61 | Temp 97.2°F | Ht 60.0 in | Wt 185.2 lb

## 2024-07-28 DIAGNOSIS — I10 Essential (primary) hypertension: Secondary | ICD-10-CM

## 2024-07-28 DIAGNOSIS — I82413 Acute embolism and thrombosis of femoral vein, bilateral: Secondary | ICD-10-CM | POA: Diagnosis not present

## 2024-07-28 DIAGNOSIS — S40022D Contusion of left upper arm, subsequent encounter: Secondary | ICD-10-CM

## 2024-07-28 NOTE — Progress Notes (Signed)
   Amanda Mathews is a 78 y.o. female who presents today for an office visit.  Assessment/Plan:  New/Acute Problems: Left Arm cellulitis/hematoma Cellulitis is improving with Keflex .  Overall reassuring exam today.  She does have a small approximately 2 cm indurated area which is likely a hematoma.  Area is nonfluctuant and nontender to palpation-doubt that this represents an abscess.  Recommended against incision and drainage today.  She will complete her course of antibiotics and follow-up with us  again in a couple of weeks.  We did discuss reasons to return to care sooner.   Chronic Problems Addressed Today: Essential hypertension At goal today on HCTZ 12.5 mg daily.  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  On warfarin.  She will be completing her course of anticoagulation next month.  She has been tolerating well.     Subjective:  HPI:  See assessment / plan for status of chronic conditions.   Discussed the use of AI scribe software for clinical note transcription with the patient, who gave verbal consent to proceed.  History of Present Illness Amanda Mathews is a 78 year old female who presents for follow-up for left arm cellulitis  Approximately one month ago, she developed a red, swollen area on her arm after scraping it on a sliding glass door. She was uncertain if the injury was due to the scrape or a possible insect bite.  She was seen by different provider at another office a week ago and was prescribed cephalexin . The redness has improved, but a firm knot remains. There is no pain upon applying pressure to the area. She continues to apply warm compresses and has a few days left of her antibiotic course.  No reported fevers or chills.       Objective:  Physical Exam: BP 138/78   Pulse 61   Temp (!) 97.2 F (36.2 C) (Temporal)   Ht 5' (1.524 m)   Wt 185 lb 3.2 oz (84 kg)   SpO2 94%   BMI  36.17 kg/m   Gen: No acute distress, resting comfortably Skin: Firm indurated approximately 2 cm mass on left forearm.  No areas of fluctuance.  Non tender to palpation.  No purulent drainage.  No surrounding erythema. Neuro: Grossly normal, moves all extremities Psych: Normal affect and thought content      Amanda Mathews M. Kennyth, MD 07/28/2024 10:47 AM

## 2024-07-28 NOTE — Assessment & Plan Note (Signed)
 Follows with DVT clinic.  On warfarin.  She will be completing her course of anticoagulation next month.  She has been tolerating well.

## 2024-07-28 NOTE — Patient Instructions (Signed)
 It was very nice to see you today!  VISIT SUMMARY: Today, we discussed the progress of the spot on your arm. The redness on your arm has improved, and the firm knot is likely a hematoma.   YOUR PLAN: HEMATOMA OF ARM WITH RESOLVING CELLULITIS: The firm knot on your arm is likely a hematoma from minor trauma or an insect bite, worsened by your anticoagulant use. The cellulitis has improved with Cefalexin. -Continue taking Cefalexin and complete the course. -Apply warm compresses to the affected area. -Monitor for any changes such as increased pain, redness, or pus. -Avoid any incision unless symptoms worsen or do not resolve. -Schedule a follow-up appointment in two weeks to reassess.  HISTORY OF VENOUS THROMBOEMBOLISM: You are managing a blood clot and have noted improvement in swelling and pain. -Continue wearing compression hose during long periods of immobility, such as car rides or flights. -You have a follow-up appointment scheduled for mid-September to complete your treatment.  Return if symptoms worsen or fail to improve.   Take care, Dr Kennyth  PLEASE NOTE:  If you had any lab tests, please let us  know if you have not heard back within a few days. You may see your results on mychart before we have a chance to review them but we will give you a call once they are reviewed by us .   If we ordered any referrals today, please let us  know if you have not heard from their office within the next week.   If you had any urgent prescriptions sent in today, please check with the pharmacy within an hour of our visit to make sure the prescription was transmitted appropriately.   Please try these tips to maintain a healthy lifestyle:  Eat at least 3 REAL meals and 1-2 snacks per day.  Aim for no more than 5 hours between eating.  If you eat breakfast, please do so within one hour of getting up.   Each meal should contain half fruits/vegetables, one quarter protein, and one quarter carbs (no  bigger than a computer mouse)  Cut down on sweet beverages. This includes juice, soda, and sweet tea.   Drink at least 1 glass of water with each meal and aim for at least 8 glasses per day  Exercise at least 150 minutes every week.

## 2024-07-28 NOTE — Assessment & Plan Note (Signed)
 At goal today on HCTZ 12.5 mg daily.

## 2024-08-15 ENCOUNTER — Other Ambulatory Visit: Payer: Self-pay | Admitting: Internal Medicine

## 2024-08-23 DIAGNOSIS — Z1231 Encounter for screening mammogram for malignant neoplasm of breast: Secondary | ICD-10-CM | POA: Diagnosis not present

## 2024-08-24 ENCOUNTER — Encounter: Payer: Self-pay | Admitting: Student-PharmD

## 2024-08-24 ENCOUNTER — Ambulatory Visit: Attending: Vascular Surgery | Admitting: Student-PharmD

## 2024-08-24 DIAGNOSIS — I82413 Acute embolism and thrombosis of femoral vein, bilateral: Secondary | ICD-10-CM | POA: Diagnosis not present

## 2024-08-24 DIAGNOSIS — Z7901 Long term (current) use of anticoagulants: Secondary | ICD-10-CM | POA: Diagnosis not present

## 2024-08-24 NOTE — Progress Notes (Signed)
 DVT Clinic Note  Name: Amanda Mathews     MRN: [id number]     DOB: 1946-03-29     Sex: female  PCP: Amanda Worth HERO, MD  Today's Visit: Visit Information: Discharge Visit  Referred to DVT Clinic by: Primary Care - Dr. Kennyth Referred to CPP by: Dr. Magda Reason for referral:  Chief Complaint  Patient presents with   Med Management - DVT   HISTORY OF PRESENT ILLNESS: Amanda Mathews is a 78 y.o. female with PMH HTN, HLD, hypothyroidism, osteoporosis, OSA, chronic back pain, trigeminal neuralgia on carbamazepine , who presents after diagnosis of DVT for medication management. Patient was seen by her PCP 02/24/24 and reported bilateral lower extremity edema, right worse than left, along with pain for the prior month. Ultrasound showed bilateral acute DVTs and she was referred to DVT Clinic to initiate treatment. No prior personal or family history of VTE. Initially seen in DVT Clinic 02/24/24, initiated anticoagulation with warfarin and Lovenox  bridge due to drug interaction with DOACs and carbamazepine . Dr. Sheree was consulted and CT venogram was ordered. She followed up with Dr. Sheree on 03/17/24 who reviewed her CT scan and he saw no evidence of central DVT or obstructive process. He recommended she see a hematologist, who she established with in May and recommended 6 months of anticoagulation as no hypercoagulable causes were found and it was felt to be triggered by long distance travel. She continues to follow up in DVT Clinic for INR management. Today, patient arrives in good spirits and is ambulating well with a walker. Continues to have no SOB, CP, palpitations. She has been adherent with compression and elevation. Denies abnormal bleeding or bruising. Denies missed or extra doses of warfarin. No changes in medications.   Positive Thrombotic Risk Factors: Obesity, Older Age Bleeding Risk Factors: Age >65 years, Anticoagulant therapy  Negative Thrombotic Risk Factors: Previous VTE, Recent  surgery (within 3 months), Recent trauma (within 3 months), Recent admission to hospital with acute illness (within 3 months), Paralysis, paresis, or recent plaster cast immobilization of lower extremity, Central venous catheterization, Bed rest >72 hours within 3 months, Pregnancy, Recent cesarean section (within 3 months), Within 6 weeks postpartum, Estrogen therapy, Testosterone therapy, Sedentary journey lasting >8 hours within 4 weeks, Erythropoiesis-stimulating agent, Recent COVID diagnosis (within 3 months), Active cancer, Non-malignant, chronic inflammatory condition, Known thrombophilic condition, Smoking  Rx Insurance Coverage: Medicare Rx Affordability: Lovenox  is $10/month. Warfarin is $4/month. Rx Assistance Provided: None needed at this time.  Past Medical History:  Diagnosis Date   Cataract    Osteoporosis    Sleep apnea    Thyroid  disease     Past Surgical History:  Procedure Laterality Date   back stimulator      BUNIONECTOMY     CHOLECYSTECTOMY     FEMUR FRACTURE SURGERY     REPLACEMENT TOTAL KNEE BILATERAL Bilateral     Social History   Socioeconomic History   Marital status: Divorced    Spouse name: Not on file   Number of children: Not on file   Years of education: Not on file   Highest education level: Associate degree: occupational, Scientist, product/process development, or vocational program  Occupational History   Occupation: Retired    Occupation: retired    Comment: [employer]  Tobacco Use   Smoking status: Never   Smokeless tobacco: Never  Vaping Use   Vaping status: Never Used  Substance and Sexual Activity   Alcohol use: Never   Drug use: Never  Sexual activity: Not Currently    Birth control/protection: Abstinence  Other Topics Concern   Not on file  Social History Narrative   Right handed   Live alone 3 stairs to door   Caffeine 2 times daily   Social Drivers of Health   Financial Resource Strain: Low Risk  (07/20/2024)   Overall Financial Resource Strain (CARDIA)     Difficulty of Paying Living Expenses: Not hard at all  Food Insecurity: No Food Insecurity (07/20/2024)   Hunger Vital Sign    Worried About Running Out of Food in the Last Year: Never true    Ran Out of Food in the Last Year: Never true  Transportation Needs: No Transportation Needs (07/20/2024)   PRAPARE - Administrator, Civil Service (Medical): No    Lack of Transportation (Non-Medical): No  Physical Activity: Insufficiently Active (07/20/2024)   Exercise Vital Sign    Days of Exercise per Week: 4 days    Minutes of Exercise per Session: 30 min  Stress: No Stress Concern Present (07/20/2024)   [employer] of Occupational Health - Occupational Stress Questionnaire    Feeling of Stress: Not at all  Social Connections: Moderately Integrated (07/20/2024)   Social Connection and Isolation Panel    Frequency of Communication with Friends and Family: More than three times a week    Frequency of Social Gatherings with Friends and Family: More than three times a week    Attends Religious Services: More than 4 times per year    Active Member of Clubs or Organizations: Yes    Attends Banker Meetings: 1 to 4 times per year    Marital Status: Divorced  Intimate Partner Violence: Not At Risk (11/05/2023)   Humiliation, Afraid, Rape, and Kick questionnaire    Fear of Current or Ex-Partner: No    Emotionally Abused: No    Physically Abused: No    Sexually Abused: No    Family History  Problem Relation Age of Onset   Hypertension Mother    Stroke Mother    Arthritis Sister    COPD Sister    Depression Sister    Diabetes Sister    Alcohol abuse Brother    Hypertension Sister     Allergies as of 08/24/2024 - Review Complete 08/24/2024  Allergen Reaction Noted   Codeine Nausea And Vomiting and Nausea Only 05/11/2018   Hydrocodone  Other (See Comments) 11/05/2023   Hydromorphone Nausea Only and Nausea And Vomiting 05/11/2018   Morphine Nausea Only and Other (See  Comments) 05/11/2018   Oxycodone  Other (See Comments) 11/05/2023   Ropinirole  10/06/2020    Current Outpatient Medications on File Prior to Visit  Medication Sig Dispense Refill   acetaminophen  (TYLENOL ) 325 MG tablet Take 325 mg by mouth every 6 (six) hours as needed for mild pain (pain score 1-3) or moderate pain (pain score 4-6).     ammonium lactate (AMLACTIN) 12 % cream APPLY THIN COAT TO AFFECTED AREAS & RUB IN TWICE A DAY IN THE MORNING & IN THE EVENING     Calcium Carb-Cholecalciferol (CALCIUM 600-D PO) Take 600 mg by mouth daily.     carbamazepine  (TEGRETOL ) 200 MG tablet Take 200 mg by mouth 3 (three) times daily.     Cholecalciferol (VITAMIN D3) 125 MCG (5000 UT) CAPS Take 5,000 Units by mouth daily.     denosumab  (PROLIA ) 60 MG/ML SOSY injection Inject 60 mg into the skin every 6 (six) months.  gabapentin (NEURONTIN) 300 MG capsule TAKE 1 CAPSULE BY MOUTH THREE TIMES A DAY (Patient taking differently: Take 300 mg by mouth 2 (two) times daily.) 90 capsule 5   hydrochlorothiazide  (HYDRODIURIL ) 25 MG tablet Take 0.5 tablets (12.5 mg total) by mouth daily.     hydrocortisone 2.5 % cream Apply topically daily as needed.     levothyroxine  (SYNTHROID ) 137 MCG tablet TAKE 1 TABLET BY MOUTH EVERY DAY BEFORE BREAKFAST 90 tablet 1   methocarbamol  (ROBAXIN ) 750 MG tablet TAKE 1 TABLET BY MOUTH TWICE A DAY AS NEEDED FOR MUSCLE SPASMS 60 tablet 0   nystatin cream (MYCOSTATIN) nystatin 100,000 unit/gram topical cream     simvastatin  (ZOCOR ) 20 MG tablet TAKE 1 TABLET BY MOUTH EVERYDAY AT BEDTIME 90 tablet 3   triamcinolone cream (KENALOG) 0.1 % MIX WITH NYSTATIN AND APPLY A THIN LAYER TO THE AFFECTED AREA TWICE DAILY AS NEEDED     No current facility-administered medications on file prior to visit.   REVIEW OF SYSTEMS:  Review of Systems  Respiratory:  Negative for shortness of breath.   Cardiovascular:  Negative for chest pain, palpitations and leg swelling.  Musculoskeletal:  Negative  for myalgias.  Neurological:  Negative for dizziness and tingling.   PHYSICAL EXAMINATION:  Physical Exam Pulmonary:     Effort: Pulmonary effort is normal.  Musculoskeletal:        General: No swelling or tenderness.  Skin:    Findings: No bruising or erythema.  Psychiatric:        Mood and Affect: Mood normal.        Behavior: Behavior normal.        Thought Content: Thought content normal.   LABS:  CBC     Component Value Date/Time   WBC 5.4 04/16/2024 1238   RBC 4.41 04/16/2024 1238   HGB 13.9 04/16/2024 1238   HCT 39.8 04/16/2024 1238   PLT 199 04/16/2024 1238   MCV 90.2 04/16/2024 1238   MCH 31.5 04/16/2024 1238   MCHC 34.9 04/16/2024 1238   RDW 12.8 04/16/2024 1238   LYMPHSABS 0.8 04/16/2024 1238   MONOABS 0.5 04/16/2024 1238   EOSABS 0.1 04/16/2024 1238   BASOSABS 0.0 04/16/2024 1238    Hepatic Function      Component Value Date/Time   PROT 6.4 02/24/2024 0906   ALBUMIN 4.1 02/24/2024 0906   AST 15 02/24/2024 0906   ALT 11 02/24/2024 0906   ALKPHOS 115 02/24/2024 0906   BILITOT 0.5 02/24/2024 0906    Renal Function   Lab Results  Component Value Date   CREATININE 0.96 02/24/2024   CREATININE 0.86 03/20/2023   CREATININE 0.98 05/06/2022    CrCl cannot be calculated (Patient's most recent lab result is older than the maximum 21 days allowed.).   [facility] Vascular Lab Studies:  02/24/24 VAS US  LOWER EXTREMITY VENOUS (DVT)  Summary:  RIGHT:  - Findings consistent with acute deep vein thrombosis involving the right  common femoral vein, right femoral vein, right popliteal vein, and TPT.  - All other veins visualized appear fully compressible and demonstrate  appropriate Doppler characteristics.    LEFT:  - Findings consistent with acute deep vein thrombosis involving the left  femoral vein, and left popliteal vein.  - All other veins visualized appear fully compressible and demonstrate  appropriate Doppler characteristics.    Right Technical Findings:   Unable to visualize the posterior tibial and peroneal veins due to body  habitus and increase lower leg swelling.  Left Technical Findings:  Unable to visualize the posterior tibial and peroneal veins due to body  habitus and increase lower leg swelling.   ASSESSMENT: Location of DVT: Right common femoral vein, Left femoral vein, Right femoral vein, Left popliteal vein, Right popliteal vein, Right distal vein Cause of DVT: provoked by a transient risk factor  Patient without prior history of VTE diagnosed with bilateral DVTs 02/24/24. Due to involvement of the right common femoral vein and presence of extensive bilateral DVTs, discussed patient with Dr. Sheree during her initial DVT Clinic visit. Per patient, LLE edema was at baseline. She had some discomfort in both legs but does not describe them as painful. Dr. Sheree recommended starting anticoagulation and he would arrange for her to follow up with him in the next few weeks for further workup including CT venogram. She followed up with Dr. Sheree on 03/17/24 and there was no central DVT or obstructive process seen on CT. He recommended referral to hematology, who performed hypercoagulable workup as well as workup for underlying malignancy and found no causes of her DVT. Felt to be related to long distance travel. They have recommended 6 months of anticoagulation (start date 02/24/24, to end in September) with consideration of aspirin 81 mg daily after and lifelong anticoagulation if she were to experience recurrence.    Due to drug interaction with carbamazepine , DOACs are contraindicated and patient requires anticoagulation with warfarin with close INR monitoring to maintain INR goal 2-3. She has tolerated warfarin treatment well. Dose that was ultimately found to be therapeutic for her was 10 mg on Tuesdays and 7.5 mg all other days. Since she has reached the end of her 6 months of treatment per hematology, she can now discontinue warfarin treatment.  Per heme, could consider starting aspirin 81 mg daily when anticoagulation was complete, which we discussed today and the patient was agreeable to.   PLAN: -Patient is discharged from the DVT Clinic. -Discontinue anticoagulation with warfarin, as patient has completed 6 months of treatment for provoked DVT.  -Counseled patient on future VTE risk reduction strategies and to inform all future providers of DVT history. -Start aspirin 81 mg daily.   Follow up: with PCP as otherwise indicated.  Lum Herald, PharmD, [place], CPP [facility] Clinical Pharmacist Practitioner [phone]

## 2024-08-24 NOTE — Patient Instructions (Addendum)
 You have been discharged from the DVT Clinic! No further follow up in the DVT Clinic is needed.   -You can stop taking warfarin. Start taking aspirin 81 mg (low dose or baby aspirin) daily.   Please reach out if any questions come up at [phone].

## 2024-08-25 DIAGNOSIS — Z6836 Body mass index (BMI) 36.0-36.9, adult: Secondary | ICD-10-CM | POA: Diagnosis not present

## 2024-08-25 DIAGNOSIS — G500 Trigeminal neuralgia: Secondary | ICD-10-CM | POA: Diagnosis not present

## 2024-09-14 DIAGNOSIS — M47816 Spondylosis without myelopathy or radiculopathy, lumbar region: Secondary | ICD-10-CM | POA: Diagnosis not present

## 2024-09-14 DIAGNOSIS — G8929 Other chronic pain: Secondary | ICD-10-CM | POA: Diagnosis not present

## 2024-09-14 DIAGNOSIS — M545 Low back pain, unspecified: Secondary | ICD-10-CM | POA: Diagnosis not present

## 2024-09-16 ENCOUNTER — Encounter (HOSPITAL_COMMUNITY): Payer: Self-pay

## 2024-09-16 ENCOUNTER — Other Ambulatory Visit (HOSPITAL_COMMUNITY): Payer: Self-pay

## 2024-09-16 DIAGNOSIS — M545 Low back pain, unspecified: Secondary | ICD-10-CM

## 2024-09-20 DIAGNOSIS — M7989 Other specified soft tissue disorders: Secondary | ICD-10-CM

## 2024-09-23 ENCOUNTER — Other Ambulatory Visit (HOSPITAL_COMMUNITY): Payer: Self-pay

## 2024-09-23 ENCOUNTER — Ambulatory Visit (HOSPITAL_COMMUNITY)
Admission: RE | Admit: 2024-09-23 | Discharge: 2024-09-23 | Disposition: A | Discharge status: Home / Self Care | Source: Ambulatory Visit

## 2024-09-23 DIAGNOSIS — M545 Low back pain, unspecified: Secondary | ICD-10-CM

## 2024-09-23 DIAGNOSIS — M47816 Spondylosis without myelopathy or radiculopathy, lumbar region: Secondary | ICD-10-CM | POA: Diagnosis not present

## 2024-09-23 DIAGNOSIS — M4856XA Collapsed vertebra, not elsewhere classified, lumbar region, initial encounter for fracture: Secondary | ICD-10-CM | POA: Diagnosis not present

## 2024-10-05 DIAGNOSIS — G8929 Other chronic pain: Secondary | ICD-10-CM | POA: Diagnosis not present

## 2024-10-05 DIAGNOSIS — M48062 Spinal stenosis, lumbar region with neurogenic claudication: Secondary | ICD-10-CM | POA: Diagnosis not present

## 2024-10-05 DIAGNOSIS — M545 Low back pain, unspecified: Secondary | ICD-10-CM | POA: Diagnosis not present

## 2024-10-05 DIAGNOSIS — M47816 Spondylosis without myelopathy or radiculopathy, lumbar region: Secondary | ICD-10-CM | POA: Diagnosis not present

## 2024-10-12 ENCOUNTER — Other Ambulatory Visit: Payer: Self-pay | Admitting: Internal Medicine

## 2024-10-12 NOTE — Telephone Encounter (Signed)
 Pt is requesting new rx.

## 2024-10-19 DIAGNOSIS — M48062 Spinal stenosis, lumbar region with neurogenic claudication: Secondary | ICD-10-CM | POA: Diagnosis not present

## 2024-11-01 DIAGNOSIS — G959 Disease of spinal cord, unspecified: Secondary | ICD-10-CM | POA: Diagnosis not present

## 2024-11-01 DIAGNOSIS — M1990 Unspecified osteoarthritis, unspecified site: Secondary | ICD-10-CM | POA: Diagnosis not present

## 2024-11-01 DIAGNOSIS — N1831 Chronic kidney disease, stage 3a: Secondary | ICD-10-CM | POA: Diagnosis not present

## 2024-11-01 DIAGNOSIS — R32 Unspecified urinary incontinence: Secondary | ICD-10-CM | POA: Diagnosis not present

## 2024-11-01 DIAGNOSIS — M810 Age-related osteoporosis without current pathological fracture: Secondary | ICD-10-CM | POA: Diagnosis not present

## 2024-11-01 DIAGNOSIS — I129 Hypertensive chronic kidney disease with stage 1 through stage 4 chronic kidney disease, or unspecified chronic kidney disease: Secondary | ICD-10-CM | POA: Diagnosis not present

## 2024-11-01 DIAGNOSIS — Z7982 Long term (current) use of aspirin: Secondary | ICD-10-CM | POA: Diagnosis not present

## 2024-11-01 DIAGNOSIS — E785 Hyperlipidemia, unspecified: Secondary | ICD-10-CM | POA: Diagnosis not present

## 2024-11-12 ENCOUNTER — Other Ambulatory Visit: Payer: Self-pay | Admitting: Internal Medicine

## 2024-11-17 ENCOUNTER — Ambulatory Visit (INDEPENDENT_AMBULATORY_CARE_PROVIDER_SITE_OTHER)

## 2024-11-17 VITALS — BP 127/78 | HR 78 | Temp 96.9°F | Ht 60.0 in | Wt 185.0 lb

## 2024-11-17 DIAGNOSIS — Z0000 Encounter for general adult medical examination without abnormal findings: Secondary | ICD-10-CM | POA: Diagnosis not present

## 2024-11-17 NOTE — Patient Instructions (Signed)
 Amanda Mathews,  Thank you for taking the time for your Medicare Wellness Visit. I appreciate your continued commitment to your health goals. Please review the care plan we discussed, and feel free to reach out if I can assist you further.  Please note that Annual Wellness Visits do not include a physical exam. Some assessments may be limited, especially if the visit was conducted virtually. If needed, we may recommend an in-person follow-up with your provider.  Ongoing Care Seeing your primary care provider every 3 to 6 months helps us  monitor your health and provide consistent, personalized care.   Referrals If a referral was made during today's visit and you haven't received any updates within two weeks, please contact the referred provider directly to check on the status.  Recommended Screenings:  Health Maintenance  Topic Date Due   COVID-19 Vaccine (11 - 2025-26 season) 08/16/2024   Medicare Annual Wellness Visit  11/04/2024   DTaP/Tdap/Td vaccine (3 - Td or Tdap) 04/26/2033   Pneumococcal Vaccine for age over 46  Completed   Flu Shot  Completed   Osteoporosis screening with Bone Density Scan  Completed   Hepatitis C Screening  Completed   Zoster (Shingles) Vaccine  Completed   Meningitis B Vaccine  Aged Out   Breast Cancer Screening  Discontinued   Colon Cancer Screening  Discontinued       11/05/2023   11:40 AM  Advanced Directives  Does Patient Have a Medical Advance Directive? Yes  Type of Estate Agent of [employer];Living will  Does patient want to make changes to medical advance directive? No - Patient declined  Copy of Healthcare Power of Attorney in Chart? Yes - validated most recent copy scanned in chart (See row information)    Vision: Annual vision screenings are recommended for early detection of glaucoma, cataracts, and diabetic retinopathy. These exams can also reveal signs of chronic conditions such as diabetes and high blood  pressure.  Dental: Annual dental screenings help detect early signs of oral cancer, gum disease, and other conditions linked to overall health, including heart disease and diabetes.  Please see the attached documents for additional preventive care recommendations.

## 2024-11-17 NOTE — Progress Notes (Signed)
 Chief Complaint  Patient presents with   Medicare Wellness     Subjective:   Amanda Mathews is a 78 y.o. female who presents for a Medicare Annual Wellness Visit.  Visit info / Clinical Intake: Medicare Wellness Visit Type:: Subsequent Annual Wellness Visit Persons participating in visit and providing information:: patient Medicare Wellness Visit Mode:: Telephone If telephone:: video declined Since this visit was completed virtually, some vitals may be partially provided or unavailable. Missing vitals are due to the limitations of the virtual format.: Documented vitals are patient reported If Telephone or Video please confirm:: I connected with patient using audio/video enable telemedicine. I verified patient identity with two identifiers, discussed telehealth limitations, and patient agreed to proceed. Patient Location:: home Provider Location:: home office Interpreter Needed?: No Pre-visit prep was completed: yes AWV questionnaire completed by patient prior to visit?: yes Date:: 11/14/24 Living arrangements:: (!) lives alone Patient's Overall Health Status Rating: good Typical amount of pain: some Does pain affect daily life?: no Are you currently prescribed opioids?: no  Dietary Habits and Nutritional Risks How many meals a day?: 2 Eats fruit and vegetables daily?: yes Most meals are obtained by: preparing own meals; eating out In the last 2 weeks, have you had any of the following?: none Diabetic:: no  Functional Status Activities of Daily Living (to include ambulation/medication): (Patient-Rptd) Independent Ambulation: Independent with device- listed below Home Assistive Devices/Equipment: Eyeglasses; Walker (specify Type) Medication Administration: Independent Home Management (perform basic housework or laundry): (Patient-Rptd) Independent Manage your own finances?: yes Primary transportation is: driving Concerns about vision?: no *vision screening is required  for WTM* Concerns about hearing?: no  Fall Screening Falls in the past year?: (Patient-Rptd) 0 Number of falls in past year: (Patient-Rptd) 0 Was there an injury with Fall?: (Patient-Rptd) 0 Fall Risk Category Calculator: (Patient-Rptd) 0 Patient Fall Risk Level: (Patient-Rptd) Low Fall Risk  Fall Risk Patient at Risk for Falls Due to: Impaired balance/gait; Impaired mobility Fall risk Follow up: Falls prevention discussed  Home and Transportation Safety: All rugs have non-skid backing?: N/A, no rugs All stairs or steps have railings?: yes Grab bars in the bathtub or shower?: yes Have non-skid surface in bathtub or shower?: (!) no Good home lighting?: yes Regular seat belt use?: yes Hospital stays in the last year:: no  Cognitive Assessment Difficulty concentrating, remembering, or making decisions? : yes Will 6CIT or Mini Cog be Completed: yes What year is it?: 0 points What month is it?: 0 points Give patient an address phrase to remember (5 components): [address] [place]  About what time is it?: 0 points Count backwards from 20 to 1: 0 points Say the months of the year in reverse: 0 points Repeat the address phrase from earlier: 0 points 6 CIT Score: 0 points  Advance Directives (For Healthcare) Does Patient Have a Medical Advance Directive?: Yes Type of Advance Directive: Healthcare Power of Attorney Copy of Healthcare Power of Attorney in Chart?: Yes - validated most recent copy scanned in chart (See row information)  Reviewed/Updated  Reviewed/Updated: Reviewed All (Medical, Surgical, Family, Medications, Allergies, Care Teams, Patient Goals)    Allergies (verified) Codeine, Hydrocodone , Hydromorphone, Morphine, Oxycodone , and Ropinirole   Current Medications (verified) Outpatient Encounter Medications as of 11/17/2024  Medication Sig   ammonium lactate (AMLACTIN) 12 % cream APPLY THIN COAT TO AFFECTED AREAS & RUB IN TWICE A DAY IN THE MORNING & IN THE  EVENING   Calcium Carb-Cholecalciferol (CALCIUM 600-D PO) Take 600 mg by  mouth daily.   carbamazepine  (TEGRETOL ) 200 MG tablet Take 200 mg by mouth 3 (three) times daily.   Cholecalciferol (VITAMIN D3) 125 MCG (5000 UT) CAPS Take 5,000 Units by mouth daily.   denosumab  (PROLIA ) 60 MG/ML SOSY injection Inject 60 mg into the skin every 6 (six) months.   gabapentin (NEURONTIN) 300 MG capsule TAKE 1 CAPSULE BY MOUTH THREE TIMES A DAY   hydrochlorothiazide  (HYDRODIURIL ) 25 MG tablet Take 0.5 tablets (12.5 mg total) by mouth daily.   hydrocortisone 2.5 % cream Apply topically daily as needed.   levothyroxine  (SYNTHROID ) 137 MCG tablet TAKE 1 TABLET BY MOUTH EVERY DAY BEFORE BREAKFAST   methocarbamol  (ROBAXIN ) 750 MG tablet TAKE 1 TABLET BY MOUTH TWICE A DAY AS NEEDED FOR MUSCLE SPASMS   MNEXSPIKE 10 MCG/0.2ML SUSY    nystatin cream (MYCOSTATIN) nystatin 100,000 unit/gram topical cream   simvastatin  (ZOCOR ) 20 MG tablet TAKE 1 TABLET BY MOUTH EVERYDAY AT BEDTIME   triamcinolone cream (KENALOG) 0.1 % MIX WITH NYSTATIN AND APPLY A THIN LAYER TO THE AFFECTED AREA TWICE DAILY AS NEEDED   aspirin 81 MG chewable tablet    [DISCONTINUED] acetaminophen  (TYLENOL ) 325 MG tablet Take 325 mg by mouth every 6 (six) hours as needed for mild pain (pain score 1-3) or moderate pain (pain score 4-6).   No facility-administered encounter medications on file as of 11/17/2024.    History: Past Medical History:  Diagnosis Date   Cataract    Osteoporosis    Sleep apnea    Thyroid  disease    Past Surgical History:  Procedure Laterality Date   back stimulator      BUNIONECTOMY     CHOLECYSTECTOMY     FEMUR FRACTURE SURGERY     REPLACEMENT TOTAL KNEE BILATERAL Bilateral    Family History  Problem Relation Age of Onset   Hypertension Mother    Stroke Mother    Arthritis Sister    COPD Sister    Depression Sister    Diabetes Sister    Alcohol abuse Brother    Hypertension Sister    Social History    Occupational History   Occupation: Retired    Occupation: retired    Comment: [employer]  Tobacco Use   Smoking status: Never   Smokeless tobacco: Never  Vaping Use   Vaping status: Never Used  Substance and Sexual Activity   Alcohol use: Never   Drug use: Never   Sexual activity: Not Currently    Birth control/protection: Abstinence   Tobacco Counseling Counseling given: Not Answered  SDOH Screenings   Food Insecurity: No Food Insecurity (11/14/2024)  Housing: Low Risk  (11/14/2024)  Transportation Needs: No Transportation Needs (11/14/2024)  Utilities: Not At Risk (11/17/2024)  Depression (PHQ2-9): Low Risk  (11/17/2024)  Financial Resource Strain: Low Risk  (11/14/2024)  Physical Activity: Insufficiently Active (11/14/2024)  Social Connections: Moderately Integrated (11/14/2024)  Stress: No Stress Concern Present (11/14/2024)  Tobacco Use: Low Risk  (11/17/2024)  Health Literacy: Adequate Health Literacy (11/17/2024)   See flowsheets for full screening details  Depression Screen PHQ 2 & 9 Depression Scale- Over the past 2 weeks, how often have you been bothered by any of the following problems? Little interest or pleasure in doing things: 0 Feeling down, depressed, or hopeless (PHQ Adolescent also includes...irritable): 0 PHQ-2 Total Score: 0 Trouble falling or staying asleep, or sleeping too much: 0 Feeling tired or having little energy: 0 Poor appetite or overeating (PHQ Adolescent also includes...weight loss): 0 Feeling bad  about yourself - or that you are a failure or have let yourself or your family down: 0 Trouble concentrating on things, such as reading the newspaper or watching television (PHQ Adolescent also includes...like school work): 0 Moving or speaking so slowly that other people could have noticed. Or the opposite - being so fidgety or restless that you have been moving around a lot more than usual: 0 Thoughts that you would be better off dead, or of  hurting yourself in some way: 0 PHQ-9 Total Score: 0 If you checked off any problems, how difficult have these problems made it for you to do your work, take care of things at home, or get along with other people?: Not difficult at all     Goals Addressed               This Visit's Progress     lose weight (pt-stated)        Lose weight              Objective:    Today's Vitals   11/17/24 0923  BP: 127/78  Pulse: 78  Temp: (!) 96.9 F (36.1 C)  Weight: 185 lb (83.9 kg)  Height: 5' (1.524 m)   Body mass index is 36.13 kg/m.  Hearing/Vision screen Hearing Screening - Comments:: Pt denies any hearing issues  Vision Screening - Comments:: Encouraged to follow up with provider  Immunizations and Health Maintenance Health Maintenance  Topic Date Due   COVID-19 Vaccine (12 - 2025-26 season) 03/04/2025   Medicare Annual Wellness (AWV)  11/17/2025   DTaP/Tdap/Td (3 - Td or Tdap) 04/26/2033   Pneumococcal Vaccine: 50+ Years  Completed   Influenza Vaccine  Completed   Bone Density Scan  Completed   Hepatitis C Screening  Completed   Zoster Vaccines- Shingrix  Completed   Meningococcal B Vaccine  Aged Out   Mammogram  Discontinued   Colonoscopy  Discontinued        Assessment/Plan:  This is a routine wellness examination for Amanda Mathews.  Patient Care Team: Kennyth Worth HERO, MD as PCP - General (Family Medicine)  I have personally reviewed and noted the following in the patient's chart:   Medical and social history Use of alcohol, tobacco or illicit drugs  Current medications and supplements including opioid prescriptions. Functional ability and status Nutritional status Physical activity Advanced directives List of other physicians Hospitalizations, surgeries, and ER visits in previous 12 months Vitals Screenings to include cognitive, depression, and falls Referrals and appointments  No orders of the defined types were placed in this encounter.  In  addition, I have reviewed and discussed with patient certain preventive protocols, quality metrics, and best practice recommendations. A written personalized care plan for preventive services as well as general preventive health recommendations were provided to patient.   Ellouise VEAR Haws, LPN   [id number]   Return in about 1 year (around 11/22/2025).  After Visit Summary: (MyChart) Due to this being a telephonic visit, the after visit summary with patients personalized plan was offered to patient via MyChart   Nurse Notes: none

## 2024-12-08 ENCOUNTER — Other Ambulatory Visit: Payer: Self-pay | Admitting: Internal Medicine

## 2024-12-24 NOTE — Telephone Encounter (Signed)
 Copied from CRM [id number]. Topic: Clinical - Medication Question >> Dec 23, 2024 11:58 AM Vena HERO wrote: Reason for CRM: Pt would like a call back to ask what medicine she can take (that can go with her current meds) for sinus/allergy symptoms. She says he has been having some sinus stuff going on and using vick's but she thinks she may need something else. No fever or other symptoms   Please schedule an appt for assessment and possible treatment  Kennette Cuthrell,RMA

## 2024-12-24 NOTE — Telephone Encounter (Signed)
 Sche for 12/27/24 aw

## 2024-12-27 ENCOUNTER — Ambulatory Visit: Admitting: Family Medicine

## 2024-12-27 ENCOUNTER — Encounter: Payer: Self-pay | Admitting: Family Medicine

## 2024-12-27 VITALS — BP 128/84 | HR 76 | Temp 97.5°F | Ht 60.0 in | Wt 179.5 lb

## 2024-12-27 DIAGNOSIS — R21 Rash and other nonspecific skin eruption: Secondary | ICD-10-CM

## 2024-12-27 DIAGNOSIS — J209 Acute bronchitis, unspecified: Secondary | ICD-10-CM | POA: Diagnosis not present

## 2024-12-27 MED ORDER — AZITHROMYCIN 250 MG PO TABS: ORAL_TABLET | ORAL | 0 refills | Status: AC

## 2024-12-27 MED ORDER — ALBUTEROL SULFATE HFA 108 (90 BASE) MCG/ACT IN AERS: 2.0000 | INHALATION_SPRAY | Freq: Four times a day (QID) | RESPIRATORY_TRACT | 0 refills | Status: AC | PRN

## 2024-12-27 MED ORDER — TRIAMCINOLONE ACETONIDE 0.1 % EX CREA: 1.0000 | TOPICAL_CREAM | Freq: Two times a day (BID) | CUTANEOUS | 0 refills | Status: DC

## 2024-12-27 NOTE — Addendum Note (Signed)
 Addended by: WENDOLYN JENKINS HERO on: 12/27/2024 12:19 PM   Modules accepted: Orders, Level of Service

## 2024-12-27 NOTE — Progress Notes (Signed)
 SABRA

## 2024-12-27 NOTE — Patient Instructions (Addendum)
 It was very nice to see you today!  Albuterol  inhaler and zpack sent   PLEASE NOTE:  If you had any lab tests please let us  know if you have not heard back within a few days. You may see your results on MyChart before we have a chance to review them but we will give you a call once they are reviewed by us . If we ordered any referrals today, please let us  know if you have not heard from their office within the next week.   Please try these tips to maintain a healthy lifestyle:  Eat most of your calories during the day when you are active. Eliminate processed foods including packaged sweets (pies, cakes, cookies), reduce intake of potatoes, white bread, white pasta, and white rice. Look for whole grain options, oat flour or almond flour.  Each meal should contain half fruits/vegetables, one quarter protein, and one quarter carbs (no bigger than a computer mouse).  Cut down on sweet beverages. This includes juice, soda, and sweet tea. Also watch fruit intake, though this is a healthier sweet option, it still contains natural sugar! Limit to 3 servings daily.  Drink at least 1 glass of water with each meal and aim for at least 8 glasses per day  Exercise at least 150 minutes every week.

## 2024-12-27 NOTE — Progress Notes (Addendum)
 "  Subjective:     Patient ID: Amanda Mathews, female    DOB: 08/14/1946, 79 y.o.   MRN: [id number]  Chief Complaint  Patient presents with   Cough    Pt has cough, congestion, headache some SOB x 1 week    Discussed the use of AI scribe software for clinical note transcription with the patient, who gave verbal consent to proceed.  History of Present Illness Amanda Mathews is a 79 year old female who presents with cough, congestion, and shortness of breath.  She has been experiencing cough, congestion, and shortness of breath for about a week. The shortness of breath is a long-standing issue, worsening with exertion such as walking long distances. She uses a CPAP machine for sleep apnea but has not been diagnosed with asthma or emphysema. She inquires about obtaining an inhaler, as she has used one in the past.  She recalls having blood clots in both legs approximately a year ago, for which she was treated and subsequently taken off medication in September after her levels normalized. She currently takes aspirin and wears compression stockings due to chronic swelling, particularly in the right leg.  Her current symptoms include a runny nose and deep cough, though she does not produce sputum. No fever, but she feels persistently cold. She is worried about developing bronchitis.  She is not allergic to antibiotics but is allergic to codeine. She takes a medication three times a day for nerve-related issues, which she cannot recall the name of, and has used a Z-Pak in the past for bronchitis.    There are no preventive care reminders to display for this patient.  Past Medical History:  Diagnosis Date   Cataract    History of deep vein thrombosis (DVT) of lower extremity    bilat   Osteoporosis    Sleep apnea    Thyroid  disease     Past Surgical History:  Procedure Laterality Date   back stimulator      BUNIONECTOMY     CHOLECYSTECTOMY     FEMUR FRACTURE SURGERY      REPLACEMENT TOTAL KNEE BILATERAL Bilateral     Current Medications[1]  Allergies[2] ROS neg/noncontributory except as noted HPI/below      Objective:     BP 128/84 (BP Location: Left Arm, Patient Position: Sitting, Cuff Size: Normal)   Pulse 76   Temp (!) 97.5 F (36.4 C) (Temporal)   Ht 5' (1.524 m)   Wt 179 lb 8 oz (81.4 kg)   SpO2 98%   BMI 35.06 kg/m  Wt Readings from Last 3 Encounters:  12/27/24 179 lb 8 oz (81.4 kg)  11/17/24 185 lb (83.9 kg)  07/28/24 185 lb 3.2 oz (84 kg)    Physical Exam GENERAL: Well developed, well nourished, no acute distress. HEAD EYES EARS NOSE THROAT: Normocephalic, atraumatic, conjunctiva not injected, sclera nonicteric. Tympanic membranes occluded with cerumen bilaterally. Oropharynx clear, moist without exudate. Sinuses non-tender to percussion. CARDIAC: Regular rate and rhythm, S1 S2 present, . NECK: Supple, no thyromegaly, no nodes, LUNGS: Crackles in the right lung base. Good ae EXTREMITIES: Chronic swelling, right leg greater than left. MUSCULOSKELETAL: No gross abnormalities. NEUROLOGICAL: Alert and oriented x3, cranial nerves II through XII intact. Mild mouth tremor. PSYCHIATRIC: Normal mood, good eye contact.  L pinkie finger-between mcp and pip-was flakey skin-but only observed from a distance as pt didn't mention-until she was checking out       Assessment & Plan:  Acute bronchitis,  unspecified organism  Rash  Other orders -     Azithromycin ; Take 2 tablets on day 1, then 1 tablet daily on days 2 through 5  Dispense: 6 tablet; Refill: 0 -     Albuterol  Sulfate HFA; Inhale 2 puffs into the lungs every 6 (six) hours as needed for wheezing or shortness of breath.  Dispense: 8 g; Refill: 0    Assessment and Plan Assessment & Plan Bronchitis   Oxygen saturation remains good. Azithromycin  is prescribed for potential bacterial infection and albuterol  inhaler for bronchospasm relief.  Chronic venous insufficiency with  history of deep vein thrombosis (DVT)   She experienced blood clots in both legs about a year ago, treated and currently managed with aspirin. She wears compression stockings and reports chronic swelling, more pronounced in the right leg. Continuing compression stockings is advised to manage venous insufficiency.   Sleep apnea   She uses a CPAP machine at night with no current issues discussed regarding sleep apnea management.  General Health Maintenance   She recently had a Medicare wellness visit in December.  Rash-presumed eczema but was wanother pt when pt asked about meds.  Will do moisturizer cream and send in triamcinolone  0.1%  Follow-up   A follow-up appointment with Dr. Kennyth is needed for regular management of chronic conditions, including lung health and venous insufficiency. She has upcoming appointments in [place] for pain management and dermatology and is advised to attend if feeling well enough.     Return for f/u Dr. Marlon, sob.  Jenkins CHRISTELLA Carrel, MD      [1]  Current Outpatient Medications:    albuterol  (VENTOLIN  HFA) 108 (90 Base) MCG/ACT inhaler, Inhale 2 puffs into the lungs every 6 (six) hours as needed for wheezing or shortness of breath., Disp: 8 g, Rfl: 0   ammonium lactate (AMLACTIN) 12 % cream, APPLY THIN COAT TO AFFECTED AREAS & RUB IN TWICE A DAY IN THE MORNING & IN THE EVENING, Disp: , Rfl:    aspirin 81 MG chewable tablet, , Disp: , Rfl:    azithromycin  (ZITHROMAX ) 250 MG tablet, Take 2 tablets on day 1, then 1 tablet daily on days 2 through 5, Disp: 6 tablet, Rfl: 0   Calcium Carb-Cholecalciferol (CALCIUM 600-D PO), Take 600 mg by mouth daily., Disp: , Rfl:    carbamazepine  (TEGRETOL ) 200 MG tablet, Take 200 mg by mouth 3 (three) times daily., Disp: , Rfl:    Cholecalciferol (VITAMIN D3) 125 MCG (5000 UT) CAPS, Take 5,000 Units by mouth daily., Disp: , Rfl:    denosumab  (PROLIA ) 60 MG/ML SOSY injection, Inject 60 mg into the skin every 6 (six)  months., Disp: , Rfl:    gabapentin (NEURONTIN) 300 MG capsule, TAKE 1 CAPSULE BY MOUTH THREE TIMES A DAY, Disp: 90 capsule, Rfl: 5   hydrochlorothiazide  (HYDRODIURIL ) 25 MG tablet, Take 0.5 tablets (12.5 mg total) by mouth daily., Disp: , Rfl:    hydrocortisone 2.5 % cream, Apply topically daily as needed., Disp: , Rfl:    levothyroxine  (SYNTHROID ) 137 MCG tablet, TAKE 1 TABLET BY MOUTH EVERY DAY BEFORE BREAKFAST, Disp: 90 tablet, Rfl: 1   methocarbamol  (ROBAXIN ) 750 MG tablet, TAKE 1 TABLET BY MOUTH TWICE A DAY AS NEEDED FOR MUSCLE SPASM, Disp: 60 tablet, Rfl: 0   MNEXSPIKE 10 MCG/0.2ML SUSY, , Disp: , Rfl:    nystatin cream (MYCOSTATIN), nystatin 100,000 unit/gram topical cream, Disp: , Rfl:    simvastatin  (ZOCOR ) 20 MG tablet, TAKE 1 TABLET BY MOUTH  EVERYDAY AT BEDTIME, Disp: 90 tablet, Rfl: 3   triamcinolone  cream (KENALOG ) 0.1 %, MIX WITH NYSTATIN AND APPLY A THIN LAYER TO THE AFFECTED AREA TWICE DAILY AS NEEDED, Disp: , Rfl:  [2]  Allergies Allergen Reactions   Codeine Nausea And Vomiting and Nausea Only    Other reaction(s): vomiting   Hydrocodone  Other (See Comments)    Hallucinate    Hydromorphone Nausea Only and Nausea And Vomiting   Morphine Nausea Only and Other (See Comments)    Hallucinations  Other reaction(s): Delusions (intolerance), hallucinations, Other (See Comments) Hallucinations     Oxycodone  Other (See Comments)    Sick and nervous   Ropinirole     hallucinations Other reaction(s): Delusions (intolerance) hallucinations   "

## 2025-01-04 ENCOUNTER — Ambulatory Visit (INDEPENDENT_AMBULATORY_CARE_PROVIDER_SITE_OTHER)

## 2025-01-04 ENCOUNTER — Ambulatory Visit: Payer: Self-pay | Admitting: Family Medicine

## 2025-01-04 ENCOUNTER — Ambulatory Visit: Admitting: Family Medicine

## 2025-01-04 ENCOUNTER — Encounter: Payer: Self-pay | Admitting: Family Medicine

## 2025-01-04 VITALS — BP 118/70 | HR 68 | Temp 97.3°F | Ht 60.0 in | Wt 180.2 lb

## 2025-01-04 DIAGNOSIS — L309 Dermatitis, unspecified: Secondary | ICD-10-CM | POA: Diagnosis not present

## 2025-01-04 DIAGNOSIS — J40 Bronchitis, not specified as acute or chronic: Secondary | ICD-10-CM

## 2025-01-04 DIAGNOSIS — H6120 Impacted cerumen, unspecified ear: Secondary | ICD-10-CM

## 2025-01-04 DIAGNOSIS — I10 Essential (primary) hypertension: Secondary | ICD-10-CM

## 2025-01-04 MED ORDER — TRIAMCINOLONE ACETONIDE 0.5 % EX CREA: 1.0000 | TOPICAL_CREAM | Freq: Three times a day (TID) | CUTANEOUS | 0 refills | Status: AC

## 2025-01-04 MED ORDER — AMOXICILLIN-POT CLAVULANATE 875-125 MG PO TABS: 1.0000 | ORAL_TABLET | Freq: Two times a day (BID) | ORAL | 0 refills | Status: AC

## 2025-01-04 NOTE — Assessment & Plan Note (Signed)
 Blood pressure at goal today on HCTZ 12.5 mg daily.

## 2025-01-04 NOTE — Progress Notes (Signed)
 X-ray does not show pneumonia but does show mild collection of fluid at the bottom of her lungs.  Her right diaphragm is also right is higher than her left diaphragm however the radiologist indicates that this is a stable and chronic issue for her.  I would like for her to continue with the treatment plan we discussed at her office visit and let us  know if not improving.

## 2025-01-04 NOTE — Progress Notes (Signed)
" ° °  Amanda Mathews is a 79 y.o. female who presents today for an office visit.  Assessment/Plan:  New/Acute Problems: Bronchitis She was diagnosed with bronchitis a week ago by different provider and completed a course of azithromycin .  Patient with a few scattered wheezes and rhonchi on bilateral bases right worse than left.  Overall she does feel like her symptoms are improving however given persistence of shortness of breath and physical exam findings will check x-ray today to rule out pneumonia or other potential causes.  Will also send a pocket prescription for Augmentin  with instruction to not start unless symptoms fail to improve over the next few days.  Encouraged hydration.  Discussed reasons to return to care.  Cerumen infection Successfully irrigated by RMA today.  Chronic Problems Addressed Today: Essential hypertension Blood pressure at goal today on HCTZ 12.5 mg daily.  Dermatitis She had modest improvement with triamcinolone  0.1% ointment twice daily.  Will try 0.5% triamcinolone .  She will let us  know if not improving.     Subjective:  HPI:  See assessment / plan for status of chronic conditions. Patient is here today for follow up. She was seen here by a different provider 8 days ago for bronchitis.  Symptoms at that time included a week of cough, congestion, and shortness of breath.  She was given a zpack and albuterol  inhaler.  Symptoms have improved though she still does have occasional shortness of breath and cough.  Overall feels like her symptoms are improving though she is concerned she has not had complete resolution of symptoms.  No reported fevers or chills.  She also has had some irritation on to her left fifth digit.  She was prescribed triamcinolone  last week which has helped however she has since run out of prescription and would like a refill today.  She is also concerned about potential earwax and would like to have her ears flushed today possible.         Objective:  Physical Exam: BP 118/70   Pulse 68   Temp (!) 97.3 F (36.3 C)   Ht 5' (1.524 m)   Wt 180 lb 3.2 oz (81.7 kg)   SpO2 95%   BMI 35.19 kg/m   Gen: No acute distress, resting comfortably HEENT: TMs obscured by cerumen bilaterally CV: Regular rate and rhythm with no murmurs appreciated Pulm: Normal work of breathing, speaking in full sentences.  Rales and scattered wheezes noted at bases bilaterally Neuro: Grossly normal, moves all extremities Psych: Normal affect and thought content      Hill Mackie M. Kennyth, MD 01/04/2025 10:54 AM  "

## 2025-01-04 NOTE — Patient Instructions (Signed)
 It was very nice to see you today!  We will get an x-ray today.  Please start the antibiotic if your symptoms do not continue to improve.  We flushed out your ears today.  Please try the higher strength triamcinolone  for your finger.  Let us  know if not improving.  Take care, Dr Kennyth  PLEASE NOTE:  If you had any lab tests, please let us  know if you have not heard back within a few days. You may see your results on mychart before we have a chance to review them but we will give you a call once they are reviewed by us .   If we ordered any referrals today, please let us  know if you have not heard from their office within the next week.   If you had any urgent prescriptions sent in today, please check with the pharmacy within an hour of our visit to make sure the prescription was transmitted appropriately.   Please try these tips to maintain a healthy lifestyle:  Eat at least 3 REAL meals and 1-2 snacks per day.  Aim for no more than 5 hours between eating.  If you eat breakfast, please do so within one hour of getting up.   Each meal should contain half fruits/vegetables, one quarter protein, and one quarter carbs (no bigger than a computer mouse)  Cut down on sweet beverages. This includes juice, soda, and sweet tea.   Drink at least 1 glass of water with each meal and aim for at least 8 glasses per day  Exercise at least 150 minutes every week.

## 2025-01-04 NOTE — Assessment & Plan Note (Signed)
 She had modest improvement with triamcinolone  0.1% ointment twice daily.  Will try 0.5% triamcinolone .  She will let us  know if not improving.

## 2025-01-06 ENCOUNTER — Other Ambulatory Visit: Payer: Self-pay | Admitting: Internal Medicine

## 2025-01-13 ENCOUNTER — Telehealth: Payer: Self-pay | Admitting: *Deleted

## 2025-01-13 ENCOUNTER — Other Ambulatory Visit: Payer: Self-pay | Admitting: *Deleted

## 2025-01-13 MED ORDER — HYDROCHLOROTHIAZIDE 25 MG PO TABS: 12.5000 mg | ORAL_TABLET | Freq: Every day | ORAL | 0 refills | Status: AC

## 2025-01-13 NOTE — Telephone Encounter (Signed)
 SABRA

## 2025-01-13 NOTE — Telephone Encounter (Signed)
 Copied from CRM #[id number]. Topic: Clinical - Medication Refill >> Jan 13, 2025  8:47 AM Wess RAMAN wrote: Medication: hydrochlorothiazide  (HYDRODIURIL ) 25 MG tablet   Has the patient contacted their pharmacy? Yes (Agent: If no, request that the patient contact the pharmacy for the refill. If patient does not wish to contact the pharmacy document the reason why and proceed with request.) (Agent: If yes, when and what did the pharmacy advise?) Call doctor  This is the patient's preferred pharmacy:  [id number] - [place], [place] - [address] AT [facility] [address] [place] [address] Phone: [phone] Fax: [phone]  Is this the correct pharmacy for this prescription? Yes If no, delete pharmacy and type the correct one.   Has the prescription been filled recently? No  Is the patient out of the medication? No  Has the patient been seen for an appointment in the last year OR does the patient have an upcoming appointment? Yes  Can we respond through MyChart? Yes  Agent: Please be advised that Rx refills may take up to 3 business days. We ask that you follow-up with your pharmacy.   [facility]  [facility]

## 2025-01-14 ENCOUNTER — Other Ambulatory Visit: Payer: Self-pay | Admitting: *Deleted

## 2025-01-14 ENCOUNTER — Telehealth: Payer: Self-pay

## 2025-01-14 MED ORDER — BENZONATATE 200 MG PO CAPS: 200.0000 mg | ORAL_CAPSULE | Freq: Two times a day (BID) | ORAL | 0 refills | Status: AC | PRN

## 2025-01-14 NOTE — Telephone Encounter (Signed)
 Copied from CRM [id number]. Topic: Clinical - Medication Question >> Jan 14, 2025  8:46 AM Larissa RAMAN wrote: Reason for CRM: Patient requesting a prescription for benzonatate  for cough. Medication not on current med list.    [id number] - [place], [place] - [address] AT [facility] [address] [place] [address] Phone: [phone] Fax: [phone] Hours: Not open 24 hours  Pt last seen 01/04/25 and not improving asking for Rx for cough, ok to send for patient?

## 2025-01-14 NOTE — Telephone Encounter (Signed)
 Ok to send in Tessalon  200 mg twice daily as needed for cough dispense #30.

## 2025-01-14 NOTE — Telephone Encounter (Signed)
 Rx send to pharmacy

## 2025-01-19 ENCOUNTER — Telehealth: Payer: Self-pay | Admitting: *Deleted

## 2025-01-19 NOTE — Telephone Encounter (Signed)
 Copied from CRM [id number]. Topic: Clinical - Medication Question >> Jan 19, 2025  9:44 AM Frederich PARAS wrote: Reason for CRM: pt called to leave message for Audreanna Torrisi, throw away thyroid  pills on accident, levothyroxine . Pt doesn't have any atm, please reach out to pt with ways to resolve issue  Pt callback# is [phone] >> Jan 19, 2025  9:53 AM Eva FALCON wrote: Pt returned call and states she found her thyroid  medication, is thankful for the help.  >> Jan 19, 2025  9:47 AM Frederich PARAS wrote: Pt has a funeral today 2/4 at 2 if cn reach please leave vm    Spoke with patient, patient stated she found her medication  No refill needed at this time  [facility]

## 2025-11-22 ENCOUNTER — Ambulatory Visit
